# Patient Record
Sex: Female | Born: 1990 | Hispanic: Yes | Marital: Married | Smoking: Never smoker
Health system: Southern US, Community
[De-identification: ages and names within clinical notes are randomized; demographics above are authoritative.]

## PROBLEM LIST (undated history)

## (undated) DIAGNOSIS — E282 Polycystic ovarian syndrome: Secondary | ICD-10-CM

## (undated) DIAGNOSIS — S42362A Displaced segmental fracture of shaft of humerus, left arm, initial encounter for closed fracture: Secondary | ICD-10-CM

## (undated) DIAGNOSIS — F419 Anxiety disorder, unspecified: Secondary | ICD-10-CM

## (undated) DIAGNOSIS — F53 Postpartum depression: Secondary | ICD-10-CM

## (undated) DIAGNOSIS — S42002A Fracture of unspecified part of left clavicle, initial encounter for closed fracture: Secondary | ICD-10-CM

## (undated) DIAGNOSIS — O99345 Other mental disorders complicating the puerperium: Secondary | ICD-10-CM

## (undated) DIAGNOSIS — G5632 Lesion of radial nerve, left upper limb: Secondary | ICD-10-CM

## (undated) DIAGNOSIS — O24419 Gestational diabetes mellitus in pregnancy, unspecified control: Secondary | ICD-10-CM

## (undated) HISTORY — DX: Anxiety disorder, unspecified: F41.9

## (undated) HISTORY — DX: Other mental disorders complicating the puerperium: O99.345

## (undated) HISTORY — PX: FRACTURE SURGERY: SHX138

## (undated) HISTORY — DX: Postpartum depression: F53.0

---

## 2015-07-27 ENCOUNTER — Emergency Department (HOSPITAL_COMMUNITY): Payer: Self-pay

## 2015-07-27 ENCOUNTER — Encounter (HOSPITAL_COMMUNITY): Payer: Self-pay | Admitting: Neurology

## 2015-07-27 ENCOUNTER — Emergency Department (HOSPITAL_COMMUNITY)
Admission: EM | Admit: 2015-07-27 | Discharge: 2015-07-27 | Disposition: A | Discharge status: Home / Self Care | Payer: Self-pay | Attending: Emergency Medicine | Admitting: Emergency Medicine

## 2015-07-27 DIAGNOSIS — R590 Localized enlarged lymph nodes: Secondary | ICD-10-CM | POA: Insufficient documentation

## 2015-07-27 DIAGNOSIS — R102 Pelvic and perineal pain: Secondary | ICD-10-CM

## 2015-07-27 DIAGNOSIS — R509 Fever, unspecified: Secondary | ICD-10-CM

## 2015-07-27 HISTORY — DX: Polycystic ovarian syndrome: E28.2

## 2015-07-27 LAB — BASIC METABOLIC PANEL
Anion gap: 8 (ref 5–15)
BUN: 6 mg/dL (ref 6–20)
CHLORIDE: 103 mmol/L (ref 101–111)
CO2: 25 mmol/L (ref 22–32)
CREATININE: 0.7 mg/dL (ref 0.44–1.00)
Calcium: 9.1 mg/dL (ref 8.9–10.3)
GFR calc Af Amer: >60 mL/min (ref >60)
GFR calc non Af Amer: >60 mL/min (ref >60)
GLUCOSE: 118 mg/dL — AB (ref 65–99)
POTASSIUM: 3.8 mmol/L (ref 3.5–5.1)
SODIUM: 136 mmol/L (ref 135–145)

## 2015-07-27 LAB — URINE MICROSCOPIC-ADD ON: WBC, UA: NONE SEEN WBC/hpf (ref 0–5)

## 2015-07-27 LAB — CBC WITH DIFFERENTIAL/PLATELET
BASOS ABS: 0 10*3/uL (ref 0.0–0.1)
BASOS PCT: 0 %
Eosinophils Absolute: 0.1 10*3/uL (ref 0.0–0.7)
Eosinophils Relative: 1 %
HEMATOCRIT: 40 % (ref 36.0–46.0)
HEMOGLOBIN: 13 g/dL (ref 12.0–15.0)
Lymphocytes Relative: 23 %
Lymphs Abs: 2.3 10*3/uL (ref 0.7–4.0)
MCH: 27.7 pg (ref 26.0–34.0)
MCHC: 32.5 g/dL (ref 30.0–36.0)
MCV: 85.1 fL (ref 78.0–100.0)
MONO ABS: 1 10*3/uL (ref 0.1–1.0)
Monocytes Relative: 10 %
NEUTROS ABS: 6.7 10*3/uL (ref 1.7–7.7)
NEUTROS PCT: 66 %
Platelets: 283 10*3/uL (ref 150–400)
RBC: 4.7 MIL/uL (ref 3.87–5.11)
RDW: 13.4 % (ref 11.5–15.5)
WBC: 10.1 10*3/uL (ref 4.0–10.5)

## 2015-07-27 LAB — WET PREP, GENITAL
Sperm: NONE SEEN
TRICH WET PREP: NONE SEEN
YEAST WET PREP: NONE SEEN

## 2015-07-27 LAB — I-STAT CG4 LACTIC ACID, ED
LACTIC ACID, VENOUS: 0.6 mmol/L (ref 0.5–2.0)
Lactic Acid, Venous: 0.8 mmol/L (ref 0.5–2.0)

## 2015-07-27 LAB — URINALYSIS, ROUTINE W REFLEX MICROSCOPIC
BILIRUBIN URINE: NEGATIVE
Glucose, UA: NEGATIVE mg/dL
Ketones, ur: NEGATIVE mg/dL
LEUKOCYTES UA: NEGATIVE
NITRITE: NEGATIVE
PH: 6 (ref 5.0–8.0)
Protein, ur: NEGATIVE mg/dL
SPECIFIC GRAVITY, URINE: 1.012 (ref 1.005–1.030)

## 2015-07-27 LAB — I-STAT BETA HCG BLOOD, ED (MC, WL, AP ONLY)

## 2015-07-27 LAB — RAPID STREP SCREEN (MED CTR MEBANE ONLY): STREPTOCOCCUS, GROUP A SCREEN (DIRECT): NEGATIVE

## 2015-07-27 LAB — MONONUCLEOSIS SCREEN: MONO SCREEN: NEGATIVE

## 2015-07-27 NOTE — ED Provider Notes (Signed)
CSN: [id number]     Arrival date & time 07/27/15  1122 History   First MD Initiated Contact with Patient 07/27/15 1544     Chief Complaint  Patient presents with  . Fever     (Consider location/radiation/quality/duration/timing/severity/associated sxs/prior Treatment) HPI Patient presents with episodic fever up to 102. Last 3 days. She takes Tylenol for this at home. She's also complains of generalized fatigue and a frontal headache with fever. She denies any sore throat, nasal congestion or sinus pressure. She has no neck pain or stiffness. Denies cough or shortness of breath. No abdominal pain, nausea or vomiting. No new rashes. No sick contacts or recent foreign travel. Patient denies any dysuria, frequency or urgency. Has irregular periods. Denies any recent vaginal discharge or bleeding. Has noticed tender mass in the left groin area. This hs been present for the last 3 days. Past Medical History  Diagnosis Date  . PCOS (polycystic ovarian syndrome)    History reviewed. No pertinent past surgical history. No family history on file. Social History  Substance Use Topics  . Smoking status: Never Smoker   . Smokeless tobacco: None  . Alcohol Use: Yes   OB History    No data available     Review of Systems  Constitutional: Positive for fever and fatigue. Negative for chills.  HENT: Negative for congestion, mouth sores, rhinorrhea, sinus pressure and sore throat.   Eyes: Negative for visual disturbance.  Respiratory: Negative for cough and shortness of breath.   Cardiovascular: Negative for chest pain and leg swelling.  Gastrointestinal: Negative for nausea, vomiting, abdominal pain, diarrhea and constipation.  Genitourinary: Negative for dysuria, frequency, hematuria, flank pain, vaginal bleeding, vaginal discharge and pelvic pain.  Musculoskeletal: Negative for back pain, neck pain and neck stiffness.  Skin: Negative for rash and wound.  Neurological: Positive for headaches.  Negative for dizziness, weakness, light-headedness and numbness.  All other systems reviewed and are negative.     Allergies  Review of patient's allergies indicates no known allergies.  Home Medications   Prior to Admission medications   Not on File   BP 108/70 mmHg  Pulse 93  Temp(Src) 99.4 F (37.4 C) (Oral)  Resp 15  SpO2 100%  LMP 07/01/2015 Physical Exam  Constitutional: She is oriented to person, place, and time. She appears well-developed and well-nourished. No distress.  HENT:  Head: Normocephalic and atraumatic.  Mouth/Throat: Oropharynx is clear and moist. No oropharyngeal exudate.  No sinus tenderness with percussion. Bilateral TMs are normal  Eyes: EOM are normal. Pupils are equal, round, and reactive to light.  Neck: Normal range of motion. Neck supple.  No meningismus  Cardiovascular: Normal rate and regular rhythm.  Exam reveals no gallop and no friction rub.   No murmur heard. Pulmonary/Chest: Effort normal and breath sounds normal. No respiratory distress. She has no wheezes. She has no rales. She exhibits no tenderness.  Abdominal: Soft. Bowel sounds are normal. She exhibits no distension and no mass. There is no tenderness. There is no rebound and no guarding.  Genitourinary:  Patient has tender mass roughly 1-2 cm in diameter in the left inguinal area. There is no overlying erythema or warmth.  Musculoskeletal: Normal range of motion. She exhibits no edema or tenderness.  No lower extremity swelling, asymmetry or tenderness. Distal pulses are equal and intact. No CVA tenderness bilaterally. No midline thoracic or lumbar tenderness.  Lymphadenopathy:    She has no cervical adenopathy.  Neurological: She is alert and oriented to  person, place, and time.  Moves all extremities without deficit. Sensation is fully intact.  Skin: Skin is warm and dry. No rash noted. No erythema.  Psychiatric: She has a normal mood and affect. Her behavior is normal.  Nursing  note and vitals reviewed.   ED Course  Procedures (including critical care time) Labs Review Labs Reviewed  WET PREP, GENITAL - Abnormal; Notable for the following:    Clue Cells Wet Prep HPF POC PRESENT (*)    WBC, Wet Prep HPF POC FEW (*)    All other components within normal limits  BASIC METABOLIC PANEL - Abnormal; Notable for the following:    Glucose, Bld 118 (*)    All other components within normal limits  URINALYSIS, ROUTINE W REFLEX MICROSCOPIC (NOT AT [facility]) - Abnormal; Notable for the following:    Hgb urine dipstick SMALL (*)    All other components within normal limits  URINE MICROSCOPIC-ADD ON - Abnormal; Notable for the following:    Squamous Epithelial / LPF 0-5 (*)    Bacteria, UA RARE (*)    All other components within normal limits  RAPID STREP SCREEN (NOT AT [facility])  CULTURE, GROUP A STREP ([facility])  CBC WITH DIFFERENTIAL/PLATELET  MONONUCLEOSIS SCREEN  I-STAT BETA HCG BLOOD, ED ([facility], [facility], [facility] ONLY)  I-STAT CG4 LACTIC ACID, ED  I-STAT CG4 LACTIC ACID, ED  GC/CHLAMYDIA PROBE AMP ([place]) NOT AT [facility]    Imaging Review US Transvaginal Non-ob  07/27/2015  CLINICAL DATA:  Left adnexal tenderness on the pelvic examination. EXAM: TRANSABDOMINAL AND TRANSVAGINAL ULTRASOUND OF PELVIS TECHNIQUE: Both transabdominal and transvaginal ultrasound examinations of the pelvis were performed. Transabdominal technique was performed for global imaging of the pelvis including uterus, ovaries, adnexal regions, and pelvic cul-de-sac. It was necessary to proceed with endovaginal exam following the transabdominal exam to visualize the endometrium and ovaries. COMPARISON:  None FINDINGS: Uterus Measurements: 6.4 x 3.2 x 3.7 cm. No fibroids or other mass visualized. Normal anteverted position of the uterus. Endometrium Thickness: 0.4 cm.  No focal abnormality visualized. Right ovary Measurements: 2.5 x 1.5 x 2.1 cm. Normal appearance/no adnexal mass. Left ovary Measurements: 3.2 x 2.1 x 2.4  cm. Normal appearance/no adnexal mass. Other findings No abnormal free fluid. IMPRESSION: Normal pelvic ultrasound. Electronically Signed   By: Richarda Overlie M.D.   On: 07/27/2015 19:30   US Pelvis Complete  07/27/2015  CLINICAL DATA:  Left adnexal tenderness on the pelvic examination. EXAM: TRANSABDOMINAL AND TRANSVAGINAL ULTRASOUND OF PELVIS TECHNIQUE: Both transabdominal and transvaginal ultrasound examinations of the pelvis were performed. Transabdominal technique was performed for global imaging of the pelvis including uterus, ovaries, adnexal regions, and pelvic cul-de-sac. It was necessary to proceed with endovaginal exam following the transabdominal exam to visualize the endometrium and ovaries. COMPARISON:  None FINDINGS: Uterus Measurements: 6.4 x 3.2 x 3.7 cm. No fibroids or other mass visualized. Normal anteverted position of the uterus. Endometrium Thickness: 0.4 cm.  No focal abnormality visualized. Right ovary Measurements: 2.5 x 1.5 x 2.1 cm. Normal appearance/no adnexal mass. Left ovary Measurements: 3.2 x 2.1 x 2.4 cm. Normal appearance/no adnexal mass. Other findings No abnormal free fluid. IMPRESSION: Normal pelvic ultrasound. Electronically Signed   By: Richarda Overlie M.D.   On: 07/27/2015 19:30   US Pelvis Limited  07/27/2015  CLINICAL DATA:  Palpable mass within the left groin.  Leukocytosis. EXAM: US PELVIS LIMITED TECHNIQUE: Ultrasound examination of the pelvic soft tissues was performed in the area of clinical concern. COMPARISON:  None. FINDINGS: Focused ultrasound of the area of palpable concern in the left groin demonstrates a hypoechoic solid mass measuring 2.8 by 3.0 by 2.1 cm. Internal blood flow is noted. This mass does not have the typical sonographic morphology of a normal lymph node. The 2 other smaller adjacent crescent-shaped hypoechoic masses are seen. IMPRESSION: 3 cm solid mass within the left groin. Given the distribution of internal blood flow, this mass may represent and  abnormal by sonographic morphology lymph node. Neoplastic soft tissue mass although possible is considered less likely. Electronically Signed   By: Ted Mcalpine M.D.   On: 07/27/2015 19:26   I have personally reviewed and evaluated these images and lab results as part of my medical decision-making.   EKG Interpretation None      MDM   Final diagnoses:  Inguinal lymphadenopathy  Fever, unspecified fever cause    Patient is nontoxic appearing she has normal vital signs as well as normal labs. She does appear to have left inguinal lymphadenopathy. Awaiting pelvic and UA results. Will also test for mono  Patient's workup is essentially normal. Abdominal exam is benign. Pelvic ultrasound without any acute findings. Likely viral illness with lymphadenopathy. Advised to treat symptomatically. Return precautions have been given.  Loren Racer, MD 07/27/15 [phone]

## 2015-07-27 NOTE — ED Notes (Signed)
[facility] to left [facility] with fever intermittently x 3 days. Denies n/v/d. Pt is a x 4. Took tylenol this morning for fever. Sent here for further evaluation of groin mass.

## 2015-07-27 NOTE — Discharge Instructions (Signed)
Lymphadenopathy Lymphadenopathy refers to swollen or enlarged lymph glands, also called lymph nodes. Lymph glands are part of your body's defense (immune) system, which protects the body from infections, germs, and diseases. Lymph glands are found in many locations in your body, including the neck, underarm, and groin.  Many things can cause lymph glands to become enlarged. When your immune system responds to germs, such as viruses or bacteria, infection-fighting cells and fluid build up. This causes the glands to grow in size. Usually, this is not something to worry about. The swelling and any soreness often go away without treatment. However, swollen lymph glands can also be caused by a number of diseases. Your health care provider may do various tests to help determine the cause. If the cause of your swollen lymph glands cannot be found, it is important to monitor your condition to make sure the swelling goes away. HOME CARE INSTRUCTIONS Watch your condition for any changes. The following actions may help to lessen any discomfort you are feeling:  Get plenty of rest.  Take medicines only as directed by your health care provider. Your health care provider may recommend over-the-counter medicines for pain.  Apply moist heat compresses to the site of swollen lymph nodes as directed by your health care provider. This can help reduce any pain.  Check your lymph nodes daily for any changes.  Keep all follow-up visits as directed by your health care provider. This is important. SEEK MEDICAL CARE IF:  Your lymph nodes are still swollen after 2 weeks.  Your swelling increases or spreads to other areas.  Your lymph nodes are hard, seem fixed to the skin, or are growing rapidly.  Your skin over the lymph nodes is red and inflamed.  You have a fever.  You have chills.  You have fatigue.  You develop a sore throat.  You have abdominal pain.  You have weight loss.  You have night  sweats. SEEK IMMEDIATE MEDICAL CARE IF:  You notice fluid leaking from the area of the enlarged lymph node.  You have severe pain in any area of your body.  You have chest pain.  You have shortness of breath.   This information is not intended to replace advice given to you by your health care provider. Make sure you discuss any questions you have with your health care provider.   Document Released: 11/07/2007 Document Revised: 02/18/2014 Document Reviewed: 09/02/2013 Elsevier Interactive Patient Education 2016 Elsevier Inc.    Fever, Adult A fever is an increase in the body's temperature. It is usually defined as a temperature of 100F (38C) or higher. Brief mild or moderate fevers generally have no long-term effects, and they often do not require treatment. Moderate or high fevers may make you feel uncomfortable and can sometimes be a sign of a serious illness or disease. The sweating that may occur with repeated or prolonged fever may also cause dehydration. Fever is confirmed by taking a temperature with a thermometer. A measured temperature can vary with:  Age.  Time of day.  Location of the thermometer:  Mouth (oral).  Rectum (rectal).  Ear (tympanic).  Underarm (axillary).  Forehead (temporal). HOME CARE INSTRUCTIONS Pay attention to any changes in your symptoms. Take these actions to help with your condition:  Take over-the counter and prescription medicines only as told by your health care provider. Follow the dosing instructions carefully.  If you were prescribed an antibiotic medicine, take it as told by your health care provider. Do not  stop taking the antibiotic even if you start to feel better.  Rest as needed.  Drink enough fluid to keep your urine clear or pale yellow. This helps to prevent dehydration.  Sponge yourself or bathe with room-temperature water to help reduce your body temperature as needed. Do not use ice water.  Do not overbundle  yourself in blankets or heavy clothes. SEEK MEDICAL CARE IF:  You vomit.  You cannot eat or drink without vomiting.  You have diarrhea.  You have pain when you urinate.  Your symptoms do not improve with treatment.  You develop new symptoms.  You develop excessive weakness. SEEK IMMEDIATE MEDICAL CARE IF:  You have shortness of breath or have trouble breathing.  You are dizzy or you faint.  You are disoriented or confused.  You develop signs of dehydration, such as a dry mouth, decreased urination, or paleness.  You develop severe pain in your abdomen.  You have persistent vomiting or diarrhea.  You develop a skin rash.  Your symptoms suddenly get worse.   This information is not intended to replace advice given to you by your health care provider. Make sure you discuss any questions you have with your health care provider.   Document Released: 07/24/2000 Document Revised: 10/19/2014 Document Reviewed: 03/24/2014 Elsevier Interactive Patient Education [employer]2016 Elsevier Inc.

## 2015-07-28 LAB — GC/CHLAMYDIA PROBE AMP (~~LOC~~) NOT AT ARMC
Chlamydia: NEGATIVE
Neisseria Gonorrhea: NEGATIVE

## 2015-07-30 LAB — CULTURE, GROUP A STREP (THRC)

## 2015-10-09 ENCOUNTER — Emergency Department (HOSPITAL_COMMUNITY): Payer: No Typology Code available for payment source

## 2015-10-09 ENCOUNTER — Encounter (HOSPITAL_COMMUNITY): Payer: Self-pay | Admitting: Emergency Medicine

## 2015-10-09 ENCOUNTER — Inpatient Hospital Stay (HOSPITAL_COMMUNITY)
Admission: EM | Admit: 2015-10-09 | Discharge: 2015-10-12 | DRG: 494 | Disposition: A | Discharge status: Home / Self Care | Payer: No Typology Code available for payment source | Attending: Orthopedic Surgery | Admitting: Orthopedic Surgery

## 2015-10-09 DIAGNOSIS — S51022A Laceration with foreign body of left elbow, initial encounter: Secondary | ICD-10-CM | POA: Diagnosis present

## 2015-10-09 DIAGNOSIS — Z23 Encounter for immunization: Secondary | ICD-10-CM | POA: Diagnosis not present

## 2015-10-09 DIAGNOSIS — M542 Cervicalgia: Secondary | ICD-10-CM

## 2015-10-09 DIAGNOSIS — T148XXA Other injury of unspecified body region, initial encounter: Secondary | ICD-10-CM

## 2015-10-09 DIAGNOSIS — S42309A Unspecified fracture of shaft of humerus, unspecified arm, initial encounter for closed fracture: Secondary | ICD-10-CM

## 2015-10-09 DIAGNOSIS — S42352A Displaced comminuted fracture of shaft of humerus, left arm, initial encounter for closed fracture: Secondary | ICD-10-CM

## 2015-10-09 DIAGNOSIS — S42009A Fracture of unspecified part of unspecified clavicle, initial encounter for closed fracture: Secondary | ICD-10-CM

## 2015-10-09 DIAGNOSIS — E282 Polycystic ovarian syndrome: Secondary | ICD-10-CM | POA: Diagnosis present

## 2015-10-09 DIAGNOSIS — S42022A Displaced fracture of shaft of left clavicle, initial encounter for closed fracture: Secondary | ICD-10-CM | POA: Diagnosis present

## 2015-10-09 DIAGNOSIS — Z189 Retained foreign body fragments, unspecified material: Secondary | ICD-10-CM

## 2015-10-09 DIAGNOSIS — S42362A Displaced segmental fracture of shaft of humerus, left arm, initial encounter for closed fracture: Secondary | ICD-10-CM | POA: Diagnosis present

## 2015-10-09 DIAGNOSIS — S42002A Fracture of unspecified part of left clavicle, initial encounter for closed fracture: Secondary | ICD-10-CM | POA: Diagnosis present

## 2015-10-09 DIAGNOSIS — G5632 Lesion of radial nerve, left upper limb: Secondary | ICD-10-CM | POA: Diagnosis present

## 2015-10-09 DIAGNOSIS — E559 Vitamin D deficiency, unspecified: Secondary | ICD-10-CM | POA: Diagnosis present

## 2015-10-09 DIAGNOSIS — Y92481 Parking lot as the place of occurrence of the external cause: Secondary | ICD-10-CM | POA: Diagnosis not present

## 2015-10-09 DIAGNOSIS — S51012A Laceration without foreign body of left elbow, initial encounter: Secondary | ICD-10-CM

## 2015-10-09 DIAGNOSIS — M79602 Pain in left arm: Secondary | ICD-10-CM

## 2015-10-09 DIAGNOSIS — S42422A Displaced comminuted supracondylar fracture without intercondylar fracture of left humerus, initial encounter for closed fracture: Principal | ICD-10-CM | POA: Diagnosis present

## 2015-10-09 DIAGNOSIS — S42302B Unspecified fracture of shaft of humerus, left arm, initial encounter for open fracture: Secondary | ICD-10-CM

## 2015-10-09 HISTORY — DX: Fracture of unspecified part of left clavicle, initial encounter for closed fracture: S42.002A

## 2015-10-09 HISTORY — DX: Displaced segmental fracture of shaft of humerus, left arm, initial encounter for closed fracture: S42.362A

## 2015-10-09 HISTORY — DX: Lesion of radial nerve, left upper limb: G56.32

## 2015-10-09 LAB — COMPREHENSIVE METABOLIC PANEL
ALT: 33 U/L (ref 14–54)
ANION GAP: 7 (ref 5–15)
AST: 38 U/L (ref 15–41)
Albumin: 4.1 g/dL (ref 3.5–5.0)
Alkaline Phosphatase: 57 U/L (ref 38–126)
BILIRUBIN TOTAL: 0.6 mg/dL (ref 0.3–1.2)
BUN: 9 mg/dL (ref 6–20)
CALCIUM: 9.3 mg/dL (ref 8.9–10.3)
CO2: 25 mmol/L (ref 22–32)
Chloride: 104 mmol/L (ref 101–111)
Creatinine, Ser: 0.67 mg/dL (ref 0.44–1.00)
GFR calc Af Amer: >60 mL/min (ref >60)
Glucose, Bld: 125 mg/dL — ABNORMAL HIGH (ref 65–99)
POTASSIUM: 3.7 mmol/L (ref 3.5–5.1)
Sodium: 136 mmol/L (ref 135–145)
TOTAL PROTEIN: 7.2 g/dL (ref 6.5–8.1)

## 2015-10-09 LAB — I-STAT CHEM 8, ED
BUN: 11 mg/dL (ref 6–20)
CALCIUM ION: 1.18 mmol/L (ref 1.13–1.30)
Chloride: 102 mmol/L (ref 101–111)
Creatinine, Ser: 0.6 mg/dL (ref 0.44–1.00)
GLUCOSE: 122 mg/dL — AB (ref 65–99)
HCT: 44 % (ref 36.0–46.0)
HEMOGLOBIN: 15 g/dL (ref 12.0–15.0)
POTASSIUM: 3.8 mmol/L (ref 3.5–5.1)
Sodium: 140 mmol/L (ref 135–145)
TCO2: 25 mmol/L (ref 0–100)

## 2015-10-09 LAB — I-STAT CG4 LACTIC ACID, ED: LACTIC ACID, VENOUS: 1.68 mmol/L (ref 0.5–1.9)

## 2015-10-09 LAB — I-STAT BETA HCG BLOOD, ED (MC, WL, AP ONLY): I-stat hCG, quantitative: <5 m[IU]/mL (ref <5)

## 2015-10-09 LAB — SAMPLE TO BLOOD BANK

## 2015-10-09 LAB — CBC
HEMATOCRIT: 43 % (ref 36.0–46.0)
HEMOGLOBIN: 13.9 g/dL (ref 12.0–15.0)
MCH: 28.1 pg (ref 26.0–34.0)
MCHC: 32.3 g/dL (ref 30.0–36.0)
MCV: 86.9 fL (ref 78.0–100.0)
Platelets: 319 10*3/uL (ref 150–400)
RBC: 4.95 MIL/uL (ref 3.87–5.11)
RDW: 13.3 % (ref 11.5–15.5)
WBC: 8.9 10*3/uL (ref 4.0–10.5)

## 2015-10-09 LAB — CDS SEROLOGY

## 2015-10-09 LAB — PROTIME-INR
INR: 0.98
PROTHROMBIN TIME: 13 s (ref 11.4–15.2)

## 2015-10-09 LAB — ETHANOL

## 2015-10-09 MED ORDER — FENTANYL CITRATE (PF) 100 MCG/2ML IJ SOLN
50.0000 ug | Freq: Once | INTRAMUSCULAR | Status: AC
  Administered 2015-10-09: 50 ug via INTRAVENOUS
  Filled 2015-10-09: qty 2

## 2015-10-09 MED ORDER — FENTANYL CITRATE (PF) 100 MCG/2ML IJ SOLN
100.0000 ug | Freq: Once | INTRAMUSCULAR | Status: DC
Start: 2015-10-09 — End: 2015-10-09
  Filled 2015-10-09: qty 2

## 2015-10-09 MED ORDER — HYDROMORPHONE HCL 1 MG/ML IJ SOLN
1.0000 mg | INTRAMUSCULAR | Status: DC | PRN
  Administered 2015-10-10: 1 mg via INTRAVENOUS
  Filled 2015-10-09: qty 1

## 2015-10-09 MED ORDER — HYDROMORPHONE HCL 1 MG/ML IJ SOLN
1.0000 mg | Freq: Once | INTRAMUSCULAR | Status: AC
  Administered 2015-10-09: 1 mg via INTRAVENOUS
  Filled 2015-10-09: qty 1

## 2015-10-09 MED ORDER — MIDAZOLAM HCL 2 MG/2ML IJ SOLN
INTRAMUSCULAR | Status: AC
  Administered 2015-10-09: 2 mg
  Filled 2015-10-09: qty 4

## 2015-10-09 MED ORDER — DIPHENHYDRAMINE HCL 12.5 MG/5ML PO ELIX: 12.5000 mg | ORAL_SOLUTION | ORAL | Status: DC | PRN

## 2015-10-09 MED ORDER — OXYCODONE HCL 5 MG PO TABS: 5.0000 mg | ORAL_TABLET | ORAL | Status: DC | PRN

## 2015-10-09 MED ORDER — ONDANSETRON HCL 4 MG/2ML IJ SOLN: 4.0000 mg | Freq: Four times a day (QID) | INTRAMUSCULAR | Status: DC | PRN

## 2015-10-09 MED ORDER — TETANUS-DIPHTH-ACELL PERTUSSIS 5-2.5-18.5 LF-MCG/0.5 IM SUSP
0.5000 mL | Freq: Once | INTRAMUSCULAR | Status: AC
  Administered 2015-10-09: 0.5 mL via INTRAMUSCULAR
  Filled 2015-10-09: qty 0.5

## 2015-10-09 MED ORDER — ACETAMINOPHEN 325 MG PO TABS: 650.0000 mg | ORAL_TABLET | Freq: Four times a day (QID) | ORAL | Status: DC | PRN

## 2015-10-09 MED ORDER — CEFAZOLIN SODIUM-DEXTROSE 2-4 GM/100ML-% IV SOLN
2.0000 g | INTRAVENOUS | Status: DC
  Filled 2015-10-09: qty 100

## 2015-10-09 MED ORDER — ACETAMINOPHEN 650 MG RE SUPP: 650.0000 mg | Freq: Four times a day (QID) | RECTAL | Status: DC | PRN

## 2015-10-09 MED ORDER — MORPHINE SULFATE (PF) 4 MG/ML IV SOLN
4.0000 mg | Freq: Once | INTRAVENOUS | Status: AC
  Administered 2015-10-09: 4 mg via INTRAVENOUS
  Filled 2015-10-09: qty 1

## 2015-10-09 MED ORDER — METHOCARBAMOL 1000 MG/10ML IJ SOLN
500.0000 mg | Freq: Four times a day (QID) | INTRAVENOUS | Status: DC | PRN
  Filled 2015-10-09: qty 5

## 2015-10-09 MED ORDER — POVIDONE-IODINE 10 % EX SWAB: 2.0000 "application " | Freq: Once | CUTANEOUS | Status: DC

## 2015-10-09 MED ORDER — IOPAMIDOL (ISOVUE-300) INJECTION 61%
INTRAVENOUS | Status: AC
  Filled 2015-10-09: qty 75

## 2015-10-09 MED ORDER — SODIUM CHLORIDE 0.9 % IV SOLN: INTRAVENOUS | Status: DC

## 2015-10-09 MED ORDER — CHLORHEXIDINE GLUCONATE 4 % EX LIQD
60.0000 mL | Freq: Once | CUTANEOUS | Status: AC
  Administered 2015-10-10: 4 via TOPICAL
  Filled 2015-10-09: qty 60

## 2015-10-09 MED ORDER — HYDROMORPHONE HCL 1 MG/ML IJ SOLN: 0.5000 mg | INTRAMUSCULAR | Status: DC | PRN

## 2015-10-09 MED ORDER — PROMETHAZINE HCL 25 MG/ML IJ SOLN: 25.0000 mg | Freq: Four times a day (QID) | INTRAMUSCULAR | Status: DC | PRN

## 2015-10-09 MED ORDER — METHOCARBAMOL 500 MG PO TABS
500.0000 mg | ORAL_TABLET | Freq: Four times a day (QID) | ORAL | Status: DC | PRN
  Administered 2015-10-09 – 2015-10-10 (×2): 500 mg via ORAL
  Filled 2015-10-09 (×2): qty 1

## 2015-10-09 MED ORDER — HYDROMORPHONE HCL 1 MG/ML IJ SOLN
0.5000 mg | Freq: Once | INTRAMUSCULAR | Status: AC
Start: 2015-10-09 — End: 2015-10-09
  Administered 2015-10-09: 0.5 mg via INTRAVENOUS
  Filled 2015-10-09: qty 1
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
## 2015-10-09 NOTE — Progress Notes (Signed)
Pt roomed to unit at 1855. C/o nausea but no prn nausea meds ordered. MD paged

## 2015-10-09 NOTE — ED Notes (Signed)
Doctor at bedside.
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
## 2015-10-09 NOTE — Progress Notes (Signed)
Orthopedic Tech Progress Note Patient Details:  Krystal Glass 1990/07/11 [id number]  Ortho Devices Type of Ortho Device: Ace wrap, Arm sling, Post (long arm) splint, Coapt Ortho Device/Splint Location: Applied well padded posterior long arm splint, coaptation splint, and arm sling to Lt arm. Ortho Device/Splint Interventions: Ordered, Application Assisted Dr. Devona KonigBlackman  Krystal Glass 10/09/2015, 4:33 PM

## 2015-10-09 NOTE — ED Notes (Signed)
Attempted report to [facility]. They state they aren't receiving the pt and bed order has been changed.

## 2015-10-10 ENCOUNTER — Encounter (HOSPITAL_COMMUNITY)
Admission: EM | Disposition: A | Discharge status: Home / Self Care | Payer: Self-pay | Source: Home / Self Care | Attending: Orthopedic Surgery

## 2015-10-10 ENCOUNTER — Inpatient Hospital Stay (HOSPITAL_COMMUNITY): Payer: No Typology Code available for payment source | Admitting: Anesthesiology

## 2015-10-10 ENCOUNTER — Inpatient Hospital Stay (HOSPITAL_COMMUNITY): Payer: No Typology Code available for payment source

## 2015-10-10 HISTORY — PX: ORIF HUMERUS FRACTURE: SHX2126

## 2015-10-10 LAB — CBC
HCT: 38.1 % (ref 36.0–46.0)
Hemoglobin: 12 g/dL (ref 12.0–15.0)
MCH: 28 pg (ref 26.0–34.0)
MCHC: 31.5 g/dL (ref 30.0–36.0)
MCV: 88.8 fL (ref 78.0–100.0)
PLATELETS: 261 10*3/uL (ref 150–400)
RBC: 4.29 MIL/uL (ref 3.87–5.11)
RDW: 13.7 % (ref 11.5–15.5)
WBC: 10.3 10*3/uL (ref 4.0–10.5)

## 2015-10-10 LAB — URINALYSIS, ROUTINE W REFLEX MICROSCOPIC
BILIRUBIN URINE: NEGATIVE
Glucose, UA: NEGATIVE mg/dL
Hgb urine dipstick: NEGATIVE
Ketones, ur: NEGATIVE mg/dL
LEUKOCYTES UA: NEGATIVE
NITRITE: NEGATIVE
PH: 7 (ref 5.0–8.0)
Protein, ur: NEGATIVE mg/dL
SPECIFIC GRAVITY, URINE: 1.019 (ref 1.005–1.030)

## 2015-10-10 LAB — CREATININE, SERUM
CREATININE: 0.64 mg/dL (ref 0.44–1.00)
GFR calc non Af Amer: >60 mL/min (ref >60)

## 2015-10-10 LAB — SURGICAL PCR SCREEN
MRSA, PCR: NEGATIVE
Staphylococcus aureus: NEGATIVE

## 2015-10-10 SURGERY — OPEN REDUCTION INTERNAL FIXATION (ORIF) DISTAL HUMERUS FRACTURE
Anesthesia: General | Site: Arm Upper | Laterality: Left

## 2015-10-10 MED ORDER — ACETAMINOPHEN 325 MG PO TABS: 325.0000 mg | ORAL_TABLET | ORAL | Status: DC | PRN

## 2015-10-10 MED ORDER — ACETAMINOPHEN 10 MG/ML IV SOLN
INTRAVENOUS | Status: AC
  Filled 2015-10-10: qty 100

## 2015-10-10 MED ORDER — BISACODYL 5 MG PO TBEC: 5.0000 mg | DELAYED_RELEASE_TABLET | Freq: Every day | ORAL | Status: DC | PRN

## 2015-10-10 MED ORDER — OXYCODONE HCL 5 MG PO TABS: 5.0000 mg | ORAL_TABLET | ORAL | Status: DC | PRN

## 2015-10-10 MED ORDER — PROPOFOL 10 MG/ML IV BOLUS
INTRAVENOUS | Status: DC | PRN
  Administered 2015-10-10: 20 mg via INTRAVENOUS
  Administered 2015-10-10: 200 mg via INTRAVENOUS

## 2015-10-10 MED ORDER — NALOXONE HCL 0.4 MG/ML IJ SOLN: 0.4000 mg | INTRAMUSCULAR | Status: DC | PRN

## 2015-10-10 MED ORDER — ACETAMINOPHEN 10 MG/ML IV SOLN
1000.0000 mg | Freq: Four times a day (QID) | INTRAVENOUS | Status: DC
  Administered 2015-10-10 (×2): 1000 mg via INTRAVENOUS
  Filled 2015-10-10: qty 100

## 2015-10-10 MED ORDER — SUGAMMADEX SODIUM 200 MG/2ML IV SOLN
INTRAVENOUS | Status: AC
  Filled 2015-10-10: qty 2

## 2015-10-10 MED ORDER — HYDROMORPHONE HCL 1 MG/ML IJ SOLN: 0.2500 mg | INTRAMUSCULAR | Status: DC | PRN

## 2015-10-10 MED ORDER — DOCUSATE SODIUM 100 MG PO CAPS
100.0000 mg | ORAL_CAPSULE | Freq: Two times a day (BID) | ORAL | Status: DC
  Administered 2015-10-10 – 2015-10-12 (×4): 100 mg via ORAL
  Filled 2015-10-10 (×5): qty 1

## 2015-10-10 MED ORDER — ONDANSETRON HCL 4 MG/2ML IJ SOLN
4.0000 mg | Freq: Four times a day (QID) | INTRAMUSCULAR | Status: DC | PRN
  Administered 2015-10-10: 4 mg via INTRAVENOUS

## 2015-10-10 MED ORDER — ACETAMINOPHEN 160 MG/5ML PO SOLN: 325.0000 mg | ORAL | Status: DC | PRN

## 2015-10-10 MED ORDER — OXYCODONE HCL 5 MG/5ML PO SOLN: 5.0000 mg | Freq: Once | ORAL | Status: DC | PRN

## 2015-10-10 MED ORDER — MIDAZOLAM HCL 2 MG/2ML IJ SOLN
INTRAMUSCULAR | Status: AC
  Filled 2015-10-10: qty 2

## 2015-10-10 MED ORDER — FENTANYL CITRATE (PF) 100 MCG/2ML IJ SOLN
INTRAMUSCULAR | Status: AC
  Filled 2015-10-10: qty 2

## 2015-10-10 MED ORDER — CEFAZOLIN IN D5W 1 GM/50ML IV SOLN
1.0000 g | Freq: Four times a day (QID) | INTRAVENOUS | Status: AC
  Administered 2015-10-10 – 2015-10-11 (×3): 1 g via INTRAVENOUS
  Filled 2015-10-10 (×3): qty 50

## 2015-10-10 MED ORDER — ONDANSETRON HCL 4 MG PO TABS: 4.0000 mg | ORAL_TABLET | Freq: Four times a day (QID) | ORAL | Status: DC | PRN

## 2015-10-10 MED ORDER — ONDANSETRON HCL 4 MG/2ML IJ SOLN
INTRAMUSCULAR | Status: DC | PRN
Start: 2015-10-10 — End: 2015-10-10
  Administered 2015-10-10: 4 mg via INTRAVENOUS

## 2015-10-10 MED ORDER — LACTATED RINGERS IV SOLN
INTRAVENOUS | Status: DC | PRN
  Administered 2015-10-10 (×3): via INTRAVENOUS

## 2015-10-10 MED ORDER — ENOXAPARIN SODIUM 40 MG/0.4ML ~~LOC~~ SOLN
40.0000 mg | SUBCUTANEOUS | Status: DC
  Administered 2015-10-11 – 2015-10-12 (×2): 40 mg via SUBCUTANEOUS
  Filled 2015-10-10 (×2): qty 0.4

## 2015-10-10 MED ORDER — MIDAZOLAM HCL 5 MG/5ML IJ SOLN
INTRAMUSCULAR | Status: DC | PRN
  Administered 2015-10-10: 2 mg via INTRAVENOUS

## 2015-10-10 MED ORDER — PROPOFOL 10 MG/ML IV BOLUS
INTRAVENOUS | Status: AC
  Filled 2015-10-10: qty 20

## 2015-10-10 MED ORDER — CEFAZOLIN SODIUM-DEXTROSE 2-4 GM/100ML-% IV SOLN
2.0000 g | Freq: Once | INTRAVENOUS | Status: AC
  Administered 2015-10-10 (×2): 2 g via INTRAVENOUS
  Filled 2015-10-10: qty 100

## 2015-10-10 MED ORDER — DIPHENHYDRAMINE HCL 12.5 MG/5ML PO ELIX: 12.5000 mg | ORAL_SOLUTION | ORAL | Status: DC | PRN

## 2015-10-10 MED ORDER — METOCLOPRAMIDE HCL 5 MG/ML IJ SOLN
5.0000 mg | Freq: Three times a day (TID) | INTRAMUSCULAR | Status: DC | PRN
  Administered 2015-10-10: 10 mg via INTRAVENOUS
  Filled 2015-10-10: qty 2

## 2015-10-10 MED ORDER — 0.9 % SODIUM CHLORIDE (POUR BTL) OPTIME
TOPICAL | Status: DC | PRN
  Administered 2015-10-10: 1000 mL

## 2015-10-10 MED ORDER — OXYCODONE HCL 5 MG PO TABS: 5.0000 mg | ORAL_TABLET | Freq: Once | ORAL | Status: DC | PRN

## 2015-10-10 MED ORDER — PHENYLEPHRINE HCL 10 MG/ML IJ SOLN
INTRAMUSCULAR | Status: DC | PRN
Start: 2015-10-10 — End: 2015-10-10
  Administered 2015-10-10: 80 ug via INTRAVENOUS
  Administered 2015-10-10: 40 ug via INTRAVENOUS
  Administered 2015-10-10 (×2): 80 ug via INTRAVENOUS

## 2015-10-10 MED ORDER — POLYETHYLENE GLYCOL 3350 17 G PO PACK
17.0000 g | PACK | Freq: Every day | ORAL | Status: DC
  Administered 2015-10-10 – 2015-10-12 (×3): 17 g via ORAL
  Filled 2015-10-10 (×3): qty 1

## 2015-10-10 MED ORDER — HYDROMORPHONE 1 MG/ML IV SOLN
INTRAVENOUS | Status: DC
  Administered 2015-10-10: 22:00:00 via INTRAVENOUS
  Administered 2015-10-11: 0.3 mg via INTRAVENOUS
  Administered 2015-10-11: 1.2 mg via INTRAVENOUS
  Administered 2015-10-11: 0.3 mg via INTRAVENOUS
  Filled 2015-10-10: qty 25

## 2015-10-10 MED ORDER — CEFAZOLIN SODIUM 1 G IJ SOLR
INTRAMUSCULAR | Status: AC
  Filled 2015-10-10: qty 20

## 2015-10-10 MED ORDER — ACETAMINOPHEN 650 MG RE SUPP: 650.0000 mg | Freq: Four times a day (QID) | RECTAL | Status: DC | PRN

## 2015-10-10 MED ORDER — FENTANYL CITRATE (PF) 100 MCG/2ML IJ SOLN
INTRAMUSCULAR | Status: DC | PRN
  Administered 2015-10-10: 100 ug via INTRAVENOUS
  Administered 2015-10-10 (×2): 50 ug via INTRAVENOUS

## 2015-10-10 MED ORDER — LIDOCAINE HCL 4 % EX SOLN
CUTANEOUS | Status: DC | PRN
  Administered 2015-10-10: 2 mL via TOPICAL

## 2015-10-10 MED ORDER — POTASSIUM CHLORIDE IN NACL 20-0.9 MEQ/L-% IV SOLN
INTRAVENOUS | Status: DC
  Administered 2015-10-10: 21:00:00 via INTRAVENOUS
  Filled 2015-10-10: qty 1000

## 2015-10-10 MED ORDER — METOCLOPRAMIDE HCL 5 MG PO TABS: 5.0000 mg | ORAL_TABLET | Freq: Three times a day (TID) | ORAL | Status: DC | PRN

## 2015-10-10 MED ORDER — ACETAMINOPHEN 325 MG PO TABS: 650.0000 mg | ORAL_TABLET | Freq: Four times a day (QID) | ORAL | Status: DC | PRN

## 2015-10-10 MED ORDER — GABAPENTIN 300 MG PO CAPS
300.0000 mg | ORAL_CAPSULE | Freq: Two times a day (BID) | ORAL | Status: DC
  Administered 2015-10-10 – 2015-10-12 (×4): 300 mg via ORAL
  Filled 2015-10-10 (×4): qty 1

## 2015-10-10 MED ORDER — BUPIVACAINE-EPINEPHRINE (PF) 0.5% -1:200000 IJ SOLN
INTRAMUSCULAR | Status: DC | PRN
  Administered 2015-10-10: 30 mL via PERINEURAL

## 2015-10-10 MED ORDER — SODIUM CHLORIDE 0.9% FLUSH: 9.0000 mL | INTRAVENOUS | Status: DC | PRN

## 2015-10-10 MED ORDER — CEFAZOLIN SODIUM-DEXTROSE 2-4 GM/100ML-% IV SOLN: 2.0000 g | Freq: Once | INTRAVENOUS | Status: DC

## 2015-10-10 MED ORDER — DIPHENHYDRAMINE HCL 12.5 MG/5ML PO ELIX: 12.5000 mg | ORAL_SOLUTION | Freq: Four times a day (QID) | ORAL | Status: DC | PRN

## 2015-10-10 MED ORDER — ONDANSETRON HCL 4 MG/2ML IJ SOLN
4.0000 mg | Freq: Four times a day (QID) | INTRAMUSCULAR | Status: DC | PRN
  Filled 2015-10-10: qty 2

## 2015-10-10 MED ORDER — SUGAMMADEX SODIUM 200 MG/2ML IV SOLN
INTRAVENOUS | Status: DC | PRN
  Administered 2015-10-10: 200 mg via INTRAVENOUS

## 2015-10-10 MED ORDER — DEXTROSE 5 % IV SOLN
INTRAVENOUS | Status: DC | PRN
  Administered 2015-10-10: 10 ug/min via INTRAVENOUS

## 2015-10-10 MED ORDER — DIPHENHYDRAMINE HCL 50 MG/ML IJ SOLN: 12.5000 mg | Freq: Four times a day (QID) | INTRAMUSCULAR | Status: DC | PRN

## 2015-10-10 MED ORDER — ROCURONIUM BROMIDE 100 MG/10ML IV SOLN
INTRAVENOUS | Status: DC | PRN
  Administered 2015-10-10: 10 mg via INTRAVENOUS
  Administered 2015-10-10: 50 mg via INTRAVENOUS

## 2015-10-10 MED ORDER — SUCCINYLCHOLINE CHLORIDE 20 MG/ML IJ SOLN
INTRAMUSCULAR | Status: DC | PRN
  Administered 2015-10-10: 60 mg via INTRAVENOUS

## 2015-10-10 MED ORDER — HYDROCODONE-ACETAMINOPHEN 7.5-325 MG PO TABS: 1.0000 | ORAL_TABLET | Freq: Four times a day (QID) | ORAL | Status: DC | PRN

## 2015-10-10 SURGICAL SUPPLY — 104 items
BENZOIN TINCTURE PRP APPL 2/3 (GAUZE/BANDAGES/DRESSINGS) ×3 IMPLANT
BIT DRILL 2.0 LNG QUCK RELEASE (BIT) ×1 IMPLANT
BIT DRILL 2.5X110 QC LCP DISP (BIT) ×3 IMPLANT
BIT DRILL 2.8 (BIT) ×1
BIT DRILL 2.8X5 QR DISP (BIT) ×3 IMPLANT
BIT DRILL CALIBRATED 1.8MM (BIT) ×1 IMPLANT
BIT DRILL CANN QC 2.8X165 (BIT) ×1 IMPLANT
BIT DRILL QC 2.4X100 (BIT) ×3 IMPLANT
BLADE AVERAGE 25MMX9MM (BLADE) ×1
BLADE AVERAGE 25X9 (BLADE) ×2 IMPLANT
BNDG ESMARK 4X9 LF (GAUZE/BANDAGES/DRESSINGS) ×3 IMPLANT
BNDG GAUZE ELAST 4 BULKY (GAUZE/BANDAGES/DRESSINGS) ×6 IMPLANT
BRUSH SCRUB DISP (MISCELLANEOUS) ×6 IMPLANT
CORDS BIPOLAR (ELECTRODE) ×3 IMPLANT
COVER SURGICAL LIGHT HANDLE (MISCELLANEOUS) ×6 IMPLANT
DRAIN PENROSE 1/4X12 LTX STRL (WOUND CARE) ×3 IMPLANT
DRAPE C-ARM 42X72 X-RAY (DRAPES) ×3 IMPLANT
DRAPE C-ARMOR (DRAPES) ×3 IMPLANT
DRAPE EXTREMITY T 121X128X90 (DRAPE) ×3 IMPLANT
DRAPE IMP U-DRAPE 54X76 (DRAPES) ×3 IMPLANT
DRAPE INCISE IOBAN 66X45 STRL (DRAPES) ×3 IMPLANT
DRAPE SURG 17X11 SM STRL (DRAPES) ×6 IMPLANT
DRAPE U-SHAPE 47X51 STRL (DRAPES) ×6 IMPLANT
DRILL 2.0 LNG QUICK RELEASE (BIT) ×3
DRILL BIT 2.8MM (BIT) ×2
DRILL CALIBRATED 1.8MM (BIT) ×3
DRSG ADAPTIC 3X8 NADH LF (GAUZE/BANDAGES/DRESSINGS) ×3 IMPLANT
DRSG MEPILEX BORDER 4X4 (GAUZE/BANDAGES/DRESSINGS) ×3 IMPLANT
DRSG MEPILEX BORDER 4X8 (GAUZE/BANDAGES/DRESSINGS) ×3 IMPLANT
DRSG PAD ABDOMINAL 8X10 ST (GAUZE/BANDAGES/DRESSINGS) ×3 IMPLANT
ELECT BLADE 4.0 EZ CLEAN MEGAD (MISCELLANEOUS) ×3
ELECT REM PT RETURN 9FT ADLT (ELECTROSURGICAL) ×3
ELECTRODE BLDE 4.0 EZ CLN MEGD (MISCELLANEOUS) ×1 IMPLANT
ELECTRODE REM PT RTRN 9FT ADLT (ELECTROSURGICAL) ×1 IMPLANT
EVACUATOR 1/8 PVC DRAIN (DRAIN) ×3 IMPLANT
GAUZE SPONGE 4X4 12PLY STRL (GAUZE/BANDAGES/DRESSINGS) ×6 IMPLANT
GLOVE BIO SURGEON STRL SZ7.5 (GLOVE) ×3 IMPLANT
GLOVE BIOGEL PI IND STRL 7.5 (GLOVE) ×1 IMPLANT
GLOVE BIOGEL PI IND STRL 8 (GLOVE) ×1 IMPLANT
GLOVE BIOGEL PI INDICATOR 7.5 (GLOVE) ×2
GLOVE BIOGEL PI INDICATOR 8 (GLOVE) ×2
GLOVE SURG SS PI 6.5 STRL IVOR (GLOVE) ×3 IMPLANT
GOWN STRL REUS W/ TWL LRG LVL3 (GOWN DISPOSABLE) ×2 IMPLANT
GOWN STRL REUS W/ TWL XL LVL3 (GOWN DISPOSABLE) ×1 IMPLANT
GOWN STRL REUS W/TWL LRG LVL3 (GOWN DISPOSABLE) ×4
GOWN STRL REUS W/TWL XL LVL3 (GOWN DISPOSABLE) ×2
GUIDEWIRE ORTHO .059X5 (WIRE) IMPLANT
KIT BASIN OR (CUSTOM PROCEDURE TRAY) ×3 IMPLANT
KIT ROOM TURNOVER OR (KITS) ×3 IMPLANT
MANIFOLD NEPTUNE II (INSTRUMENTS) ×3 IMPLANT
NEEDLE HYPO 25X1 1.5 SAFETY (NEEDLE) ×3 IMPLANT
NS IRRIG 1000ML POUR BTL (IV SOLUTION) ×3 IMPLANT
PACK TOTAL JOINT (CUSTOM PROCEDURE TRAY) ×3 IMPLANT
PACK UNIVERSAL I (CUSTOM PROCEDURE TRAY) ×3 IMPLANT
PAD ARMBOARD 7.5X6 YLW CONV (MISCELLANEOUS) ×6 IMPLANT
PLATE LCP 16 HOLE 3.5 (Plate) ×3 IMPLANT
PLATE LEFT 16 H (Plate) ×3 IMPLANT
SCREW 2.3X12MM (Screw) ×6 IMPLANT
SCREW BN FT 16X2.3XLCK HEX CRT (Screw) ×1 IMPLANT
SCREW CORTEX 3.5 18MM (Screw) ×2 IMPLANT
SCREW CORTEX 3.5 20MM (Screw) ×2 IMPLANT
SCREW CORTEX 3.5 22MM (Screw) ×4 IMPLANT
SCREW CORTEX 3.5 24MM (Screw) ×4 IMPLANT
SCREW CORTEX 3.5 26MM (Screw) ×2 IMPLANT
SCREW CORTEX SLFTPNG 20MM 2.4 (Screw) ×3 IMPLANT
SCREW CORTEX SLFTPNG 24MM 2.4 (Screw) ×3 IMPLANT
SCREW CORTICAL LOCKING 2.3X14M (Screw) ×3 IMPLANT
SCREW CORTICAL LOCKING 2.3X16M (Screw) ×2 IMPLANT
SCREW LOCK 12X3.5X HEXALOBE (Screw) ×1 IMPLANT
SCREW LOCK CORT ST 3.5X18 (Screw) ×1 IMPLANT
SCREW LOCK CORT ST 3.5X20 (Screw) ×1 IMPLANT
SCREW LOCK CORT ST 3.5X22 (Screw) ×2 IMPLANT
SCREW LOCK CORT ST 3.5X24 (Screw) ×2 IMPLANT
SCREW LOCK CORT ST 3.5X26 (Screw) ×1 IMPLANT
SCREW LOCK T15 FT 24X3.5X2.9X (Screw) ×1 IMPLANT
SCREW LOCKING 3.5X10MM (Screw) ×3 IMPLANT
SCREW LOCKING 3.5X12 (Screw) ×2 IMPLANT
SCREW LOCKING 3.5X24 (Screw) ×2 IMPLANT
SCREW LOCKING 3.5X26 (Screw) ×3 IMPLANT
SCREW NONLOCK HEX 3.5X12 (Screw) ×6 IMPLANT
SPONGE GAUZE 4X4 12PLY STER LF (GAUZE/BANDAGES/DRESSINGS) ×3 IMPLANT
SPONGE LAP 18X18 X RAY DECT (DISPOSABLE) ×9 IMPLANT
STAPLER VISISTAT 35W (STAPLE) ×3 IMPLANT
STOCKINETTE IMPERVIOUS 9X36 MD (GAUZE/BANDAGES/DRESSINGS) ×3 IMPLANT
SUCTION FRAZIER HANDLE 10FR (MISCELLANEOUS) ×2
SUCTION TUBE FRAZIER 10FR DISP (MISCELLANEOUS) ×1 IMPLANT
SUT ETHIBOND 5 LR DA (SUTURE) ×3 IMPLANT
SUT ETHILON 3 0 PS 1 (SUTURE) ×9 IMPLANT
SUT MNCRL AB 3-0 PS2 18 (SUTURE) ×3 IMPLANT
SUT VIC AB 0 CT1 27 (SUTURE) ×4
SUT VIC AB 0 CT1 27XBRD ANBCTR (SUTURE) ×2 IMPLANT
SUT VIC AB 1 CT1 27 (SUTURE) ×4
SUT VIC AB 1 CT1 27XBRD ANBCTR (SUTURE) ×2 IMPLANT
SUT VIC AB 2-0 CT1 27 (SUTURE) ×8
SUT VIC AB 2-0 CT1 TAPERPNT 27 (SUTURE) ×4 IMPLANT
SUT VIC AB 3-0 FS2 27 (SUTURE) ×3 IMPLANT
SYR 5ML LL (SYRINGE) IMPLANT
SYR CONTROL 10ML LL (SYRINGE) ×3 IMPLANT
TAPE STRIPS DRAPE STRL (GAUZE/BANDAGES/DRESSINGS) ×3 IMPLANT
TOWEL OR 17X24 6PK STRL BLUE (TOWEL DISPOSABLE) ×3 IMPLANT
TOWEL OR 17X26 10 PK STRL BLUE (TOWEL DISPOSABLE) ×9 IMPLANT
TRAY FOLEY CATH 16FRSI W/METER (SET/KITS/TRAYS/PACK) IMPLANT
WATER STERILE IRR 1000ML POUR (IV SOLUTION) ×3 IMPLANT
YANKAUER SUCT BULB TIP NO VENT (SUCTIONS) ×3 IMPLANT

## 2015-10-10 NOTE — Anesthesia Procedure Notes (Signed)
Procedure Name: Intubation Date/Time: 10/10/2015 3:12 PM Performed by: Izora Gala Pre-anesthesia Checklist: Patient identified, Emergency Drugs available, Suction available and Patient being monitored Patient Re-evaluated:Patient Re-evaluated prior to inductionOxygen Delivery Method: Circle system utilized Preoxygenation: Pre-oxygenation with 100% oxygen Intubation Type: IV induction Ventilation: Mask ventilation without difficulty Laryngoscope Size: Miller and 3 Grade View: Grade I Tube type: Oral Tube size: 7.5 mm Number of attempts: 1 Airway Equipment and Method: Stylet,  LTA kit utilized and Bite block Placement Confirmation: ETT inserted through vocal cords under direct vision,  positive ETCO2 and breath sounds checked- equal and bilateral Secured at: 22 cm Tube secured with: Tape Dental Injury: Teeth and Oropharynx as per pre-operative assessment

## 2015-10-10 NOTE — Brief Op Note (Signed)
10/09/2015 - 10/10/2015  7:04 PM  PATIENT:  Krystal Glass  25 y.o. female  PRE-OPERATIVE DIAGNOSIS:   1. LEFT HUMERAL SHAFT FRACTURE 2. LEFT SUPRACONDYLAR HUMERUS FRACTURE 3. LEFT CLAVICLE FRACTURE 4. TRAUMATIC POSTERIOR ELBOW WOUND, CONTAMINATED  POST-OPERATIVE DIAGNOSIS:   1. LEFT HUMERAL SHAFT FRACTURE 2. LEFT SUPRACONDYLAR HUMERUS FRACTURE 3. LEFT CLAVICLE FRACTURE 4. TRAUMATIC POSTERIOR ELBOW WOUND, CONTAMINATED  PROCEDURE:  Procedure(s): 1. OPEN REDUCTION INTERNAL FIXATION (ORIF) LEFT HUMERAL FRACTURE 2. OPEN REDUCTION INTERNAL FIXATION (ORIF) LEFT SUPRACONDYLAR HUMERUS FRACTURE 3. ORIF CLAVICLE FRACTURE (Left) 4. DEBRIDEMENT OF OPEN CONTAMINATED ELBOW WOUND  SURGEON:  Surgeon(s) and Role:    * Myrene GalasMichael Anastasija Anfinson, MD - Primary  PHYSICIAN ASSISTANT: Montez MoritaKEITH PAUL, PAC  ANESTHESIA:   general  EBL:  Total I/O In: 400 [I.V.:400] Out: -   BLOOD ADMINISTERED:none  DRAINS: none   LOCAL MEDICATIONS USED:  NONE  SPECIMEN:  No Specimen  DISPOSITION OF SPECIMEN:  N/A  COUNTS:  YES  TOURNIQUET:    DICTATION: .Other Dictation: Dictation Number [id number]  PLAN OF CARE: Admit to inpatient   PATIENT DISPOSITION:  PACU - hemodynamically stable.   Delay start of Pharmacological VTE agent (>24hrs) due to surgical blood loss or risk of bleeding: no

## 2015-10-10 NOTE — Transfer of Care (Signed)
Immediate Anesthesia Transfer of Care Note  Patient: Krystal Glass  Procedure(s) Performed: Procedure(s): OPEN REDUCTION INTERNAL FIXATION (ORIF) LEFT HUMERUS AND CLAVICLE FRACTURE (Left)  Patient Location: PACU  Anesthesia Type:General and Regional  Level of Consciousness: awake, alert  and oriented  Airway & Oxygen Therapy: Patient Spontanous Breathing  Post-op Assessment: Report given to RN and Post -op Vital signs reviewed and stable  Post vital signs: Reviewed and stable  Last Vitals:  Vitals:   10/10/15 0427 10/10/15 1927  BP: 131/67 118/63  Pulse: 67 (!) 104  Resp:  20  Temp: 37 C 36.3 C    Last Pain:  Vitals:   10/10/15 1927  TempSrc:   PainSc: (P) 0-No pain         Complications: No apparent anesthesia complications

## 2015-10-10 NOTE — Anesthesia Preprocedure Evaluation (Signed)
Anesthesia Evaluation  Patient identified by MRN, date of birth, ID band Patient awake    Reviewed: Allergy & Precautions, NPO status , Patient's Chart, lab work & pertinent test results  History of Anesthesia Complications Negative for: history of anesthetic complications  Airway Mallampati: II  TM Distance: >3 FB Neck ROM: Full    Dental  (+) Teeth Intact   Pulmonary neg pulmonary ROS,    breath sounds clear to auscultation       Cardiovascular negative cardio ROS   Rhythm:Regular     Neuro/Psych negative neurological ROS  negative psych ROS   GI/Hepatic negative GI ROS, Neg liver ROS,   Endo/Other    Renal/GU negative Renal ROS     Musculoskeletal   Abdominal   Peds  Hematology negative hematology ROS (+)   Anesthesia Other Findings   Reproductive/Obstetrics                             Anesthesia Physical Anesthesia Plan  ASA: II  Anesthesia Plan: General   Post-op Pain Management:  Regional for Post-op pain   Induction: Intravenous  Airway Management Planned: Oral ETT  Additional Equipment: None  Intra-op Plan:   Post-operative Plan: Extubation in OR  Informed Consent: I have reviewed the patients History and Physical, chart, labs and discussed the procedure including the risks, benefits and alternatives for the proposed anesthesia with the patient or authorized representative who has indicated his/her understanding and acceptance.   Dental advisory given  Plan Discussed with: CRNA and Surgeon  Anesthesia Plan Comments:         Anesthesia Quick Evaluation

## 2015-10-10 NOTE — Anesthesia Procedure Notes (Signed)
Anesthesia Regional Block:  Interscalene brachial plexus block  Pre-Anesthetic Checklist: ,, timeout performed, Correct Patient, Correct Site, Correct Laterality, Correct Procedure, Correct Position, site marked, Risks and benefits discussed,  Surgical consent,  Pre-op evaluation,  At surgeon's request and post-op pain management  Laterality: Upper and Left  Prep: chloraprep       Needles:  Injection technique: Single-shot  Needle Type: Echogenic Stimulator Needle          Additional Needles:  Procedures: ultrasound guided (picture in chart) Interscalene brachial plexus block Narrative:  Injection made incrementally with aspirations every 5 mL.  Performed by: Personally  Anesthesiologist: Annamary Buschman  Additional Notes: H+P and labs reviewed, risks and benefits discussed with patient, procedure tolerated well without complications        
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
## 2015-10-10 NOTE — Progress Notes (Signed)
Patient ID: Krystal Glass, female   DOB: 03-09-1990, 25 y.o.   MRN: [id number] Comfortable this am.  [facility] and she does complain of pain there.  X-rays reviewed and she does have a left clavicle shaft fracture with no significant shortening or angulation thus far.  Left upper extremity more comfortable in the splint.  Her left radial nerve still shows a motor deficit.  To the OR today for ORIF of her left humerus.  Have consulted Ortho Trauma - Dr. Carola FrostHandy.

## 2015-10-10 NOTE — Anesthesia Postprocedure Evaluation (Signed)
Anesthesia Post Note  Patient: Krystal Glass  Procedure(s) Performed: Procedure(s) (LRB): OPEN REDUCTION INTERNAL FIXATION (ORIF) LEFT HUMERUS AND CLAVICLE FRACTURE (Left)  Patient location during evaluation: PACU Anesthesia Type: General Level of consciousness: awake Pain management: pain level controlled Respiratory status: spontaneous breathing Anesthetic complications: no    Last Vitals:  Vitals:   10/09/15 2243 10/10/15 0427  BP: 115/61 131/67  Pulse: 72 67  Resp:    Temp: 37.4 C 37 C    Last Pain:  Vitals:   10/10/15 1300  TempSrc:   PainSc: 4                  EDWARDS,Coley Littles

## 2015-10-11 LAB — BASIC METABOLIC PANEL
ANION GAP: 7 (ref 5–15)
BUN: <5 mg/dL — ABNORMAL LOW (ref 6–20)
CALCIUM: 8.7 mg/dL — AB (ref 8.9–10.3)
CO2: 29 mmol/L (ref 22–32)
CREATININE: 0.58 mg/dL (ref 0.44–1.00)
Chloride: 102 mmol/L (ref 101–111)
GFR calc Af Amer: >60 mL/min (ref >60)
GFR calc non Af Amer: >60 mL/min (ref >60)
GLUCOSE: 102 mg/dL — AB (ref 65–99)
Potassium: 3.7 mmol/L (ref 3.5–5.1)
Sodium: 138 mmol/L (ref 135–145)

## 2015-10-11 MED ORDER — ACETAMINOPHEN 500 MG PO TABS
1000.0000 mg | ORAL_TABLET | Freq: Four times a day (QID) | ORAL | Status: DC
  Administered 2015-10-11 – 2015-10-12 (×4): 1000 mg via ORAL
  Filled 2015-10-11 (×5): qty 2

## 2015-10-11 MED ORDER — OXYCODONE HCL 5 MG PO TABS
5.0000 mg | ORAL_TABLET | ORAL | Status: DC | PRN
  Administered 2015-10-11 – 2015-10-12 (×3): 15 mg via ORAL
  Filled 2015-10-11 (×3): qty 3

## 2015-10-11 MED ORDER — IOPAMIDOL (ISOVUE-300) INJECTION 61%
75.0000 mL | Freq: Once | INTRAVENOUS | Status: AC | PRN
  Administered 2015-10-09: 75 mL via INTRAVENOUS

## 2015-10-11 MED ORDER — HYDROMORPHONE HCL 1 MG/ML IJ SOLN: 0.5000 mg | INTRAMUSCULAR | Status: DC | PRN

## 2015-10-11 NOTE — Progress Notes (Signed)
Slight drainage noted to left upper extremity dressing site. MD notified of drainage while evaluating the pt this morning. No dressing change orders given

## 2015-10-11 NOTE — Progress Notes (Signed)
1.2mg  diludid used via pca pump up to this time since last checked. pca pump discontinued
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
## 2015-10-11 NOTE — Progress Notes (Signed)
OT NOTE  RN STAFF  Please check splint every 4 hours during shift ( remove splint , remove stockinette/ dressing present) to assess for: * pain * redness *swelling  If any symptoms above present remove splint for 15 minutes. If symptoms continue - keep the splint removed and notify OT staff [phone] immediately.   Keep the UE elevated at all times on pillows / towels.  Splint can be cleaned with warm soapy water and alcohol swab. Splint should not be placed in heat of any kind because the splint with mold into a new shape.   Pt is to wear splint at this time one hour at a time. Pt can doff sling in bed but must wear splint and sling for mobility.    Krystal Glass, Krystal Glass   OTR/L Pager: [phone] Office: [phone] .
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
## 2015-10-12 ENCOUNTER — Encounter (HOSPITAL_COMMUNITY): Payer: Self-pay | Admitting: Orthopedic Surgery

## 2015-10-12 DIAGNOSIS — S51012A Laceration without foreign body of left elbow, initial encounter: Secondary | ICD-10-CM | POA: Diagnosis present

## 2015-10-12 DIAGNOSIS — S42002A Fracture of unspecified part of left clavicle, initial encounter for closed fracture: Secondary | ICD-10-CM | POA: Diagnosis present

## 2015-10-12 DIAGNOSIS — G5632 Lesion of radial nerve, left upper limb: Secondary | ICD-10-CM | POA: Diagnosis present

## 2015-10-12 HISTORY — DX: Lesion of radial nerve, left upper limb: G56.32

## 2015-10-12 HISTORY — DX: Fracture of unspecified part of left clavicle, initial encounter for closed fracture: S42.002A

## 2015-10-12 LAB — BASIC METABOLIC PANEL
Anion gap: 8 (ref 5–15)
BUN: 5 mg/dL — AB (ref 6–20)
CALCIUM: 8.7 mg/dL — AB (ref 8.9–10.3)
CO2: 28 mmol/L (ref 22–32)
CREATININE: 0.62 mg/dL (ref 0.44–1.00)
Chloride: 101 mmol/L (ref 101–111)
GFR calc non Af Amer: >60 mL/min (ref >60)
GLUCOSE: 101 mg/dL — AB (ref 65–99)
Potassium: 3.7 mmol/L (ref 3.5–5.1)
Sodium: 137 mmol/L (ref 135–145)

## 2015-10-12 LAB — CBC
HEMATOCRIT: 36.3 % (ref 36.0–46.0)
Hemoglobin: 11.4 g/dL — ABNORMAL LOW (ref 12.0–15.0)
MCH: 27.9 pg (ref 26.0–34.0)
MCHC: 31.4 g/dL (ref 30.0–36.0)
MCV: 89 fL (ref 78.0–100.0)
Platelets: 274 10*3/uL (ref 150–400)
RBC: 4.08 MIL/uL (ref 3.87–5.11)
RDW: 13.5 % (ref 11.5–15.5)
WBC: 8.1 10*3/uL (ref 4.0–10.5)

## 2015-10-12 MED ORDER — GABAPENTIN 300 MG PO CAPS: 300.0000 mg | ORAL_CAPSULE | Freq: Two times a day (BID) | ORAL | 1 refills | Status: DC

## 2015-10-12 MED ORDER — OXYCODONE HCL 5 MG PO TABS: 5.0000 mg | ORAL_TABLET | ORAL | 0 refills | Status: DC | PRN

## 2015-10-12 MED ORDER — ACETAMINOPHEN 500 MG PO TABS: 500.0000 mg | ORAL_TABLET | Freq: Four times a day (QID) | ORAL | 0 refills | Status: DC

## 2015-10-12 MED ORDER — DOCUSATE SODIUM 100 MG PO CAPS: 100.0000 mg | ORAL_CAPSULE | Freq: Two times a day (BID) | ORAL | 0 refills | Status: DC
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
## 2015-10-12 NOTE — Care Management Note (Signed)
Case Management Note  Patient Details  Name: Krystal Glass MRN: [id number] Date of Birth: March 31, 1990  Subjective/Objective:                    Action/Plan:   Expected Discharge Date:                  Expected Discharge Plan:  Home/Self Care  In-House Referral:     Discharge planning Services  CM Consult, Medication Assistance, [facility]  Post Acute Care Choice:    Choice offered to:  Patient  DME Arranged:    DME Agency:     HH Arranged:    HH Agency:     Status of Service:  Completed, signed off  If discussed at [employer] Length of Stay Meetings, dates discussed:    Additional Comments:  Kingsley PlanWile, [name], RN 10/12/2015, 10:58 AM

## 2015-10-12 NOTE — Progress Notes (Signed)
Orthopedic Tech Progress Note Patient Details:  Krystal Glass [birth year] [id number] Dropped off Velcro cock up wrist splint and instructed patient on use. Patient ID: Krystal Glass, female   DOB: [birth year], 25 y.o.   MRN: [id number]   Krystal Glass 10/12/2015, 11:51 AM
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
## 2015-10-12 NOTE — Discharge Instructions (Signed)
Orthopaedic Trauma Service Discharge Instructions   General Discharge Instructions  WEIGHT BEARING STATUS: Nonweightbearing Left upper extremity   RANGE OF MOTION/ACTIVITY:  No active L shoulder abduction  Passive L shoulder abduction ok  Passive and active L shoulder flexion and extension  Unrestricted L elbow, forearm, wrist and hand ROM (active and passive)  Wound Care: daily wound care starting on 10/14/2015. See instructions below Discharge Wound Care Instructions  Do NOT apply any ointments, solutions or lotions to pin sites or surgical wounds.  These prevent needed drainage and even though solutions like hydrogen peroxide kill bacteria, they also damage cells lining the pin sites that help fight infection.  Applying lotions or ointments can keep the wounds moist and can cause them to breakdown and open up as well. This can increase the risk for infection. When in doubt call the office.  Surgical incisions should be dressed daily.  If any drainage is noted, use one layer of adaptic, then gauze, Kerlix, and an ace wrap.  Once the incision is completely dry and without drainage, it may be left open to air out.  Showering may begin 36-48 hours later.  Cleaning gently with soap and water.  Traumatic wounds should be dressed daily as well.    One layer of adaptic, gauze, Kerlix, then ace wrap.  The adaptic can be discontinued once the draining has ceased    If you have a wet to dry dressing: wet the gauze with saline the squeeze as much saline out so the gauze is moist (not soaking wet), place moistened gauze over wound, then place a dry gauze over the moist one, followed by Kerlix wrap, then ace wrap.  PAIN MEDICATION USE AND EXPECTATIONS  You have likely been given narcotic medications to help control your pain.  After a traumatic event that results in an fracture (broken bone) with or without surgery, it is ok to use narcotic pain medications to help control one's pain.  We  understand that everyone responds to pain differently and each individual patient will be evaluated on a regular basis for the continued need for narcotic medications. Ideally, narcotic medication use should last no more than 6-8 weeks (coinciding with fracture healing).   As a patient it is your responsibility as well to monitor narcotic medication use and report the amount and frequency you use these medications when you come to your office visit.   We would also advise that if you are using narcotic medications, you should take a dose prior to therapy to maximize you participation.  IF YOU ARE ON NARCOTIC MEDICATIONS IT IS NOT PERMISSIBLE TO OPERATE A MOTOR VEHICLE (MOTORCYCLE/CAR/TRUCK/MOPED) OR HEAVY MACHINERY DO NOT MIX NARCOTICS WITH OTHER CNS (CENTRAL NERVOUS SYSTEM) DEPRESSANTS SUCH AS ALCOHOL  Diet: as you were eating previously.  Can use over the counter stool softeners and bowel preparations, such as Miralax, to help with bowel movements.  Narcotics can be constipating.  Be sure to drink plenty of fluids    STOP SMOKING OR USING NICOTINE PRODUCTS!!!!  As discussed nicotine severely impairs your body's ability to heal surgical and traumatic wounds but also impairs bone healing.  Wounds and bone heal by forming microscopic blood vessels (angiogenesis) and nicotine is a vasoconstrictor (essentially, shrinks blood vessels).  Therefore, if vasoconstriction occurs to these microscopic blood vessels they essentially disappear and are unable to deliver necessary nutrients to the healing tissue.  This is one modifiable factor that you can do to dramatically increase your chances of healing your  injury.    (This means no smoking, no nicotine gum, patches, etc)  DO NOT USE NONSTEROIDAL ANTI-INFLAMMATORY DRUGS (NSAID'S)  Using products such as Advil (ibuprofen), Aleve (naproxen), Motrin (ibuprofen) for additional pain control during fracture healing can delay and/or prevent the healing response.  If  you would like to take over the counter (OTC) medication, Tylenol (acetaminophen) is ok.  However, some narcotic medications that are given for pain control contain acetaminophen as well. Therefore, you should not exceed more than 4000 mg of tylenol in a day if you do not have liver disease.  Also note that there are may OTC medicines, such as cold medicines and allergy medicines that my contain tylenol as well.  If you have any questions about medications and/or interactions please ask your doctor/PA or your pharmacist.      ICE AND ELEVATE INJURED/OPERATIVE EXTREMITY  Using ice and elevating the injured extremity above your heart can help with swelling and pain control.  Icing in a pulsatile fashion, such as 20 minutes on and 20 minutes off, can be followed.    Do not place ice directly on skin. Make sure there is a barrier between to skin and the ice pack.    Using frozen items such as frozen peas works well as the conform nicely to the are that needs to be iced.  USE AN ACE WRAP OR TED HOSE FOR SWELLING CONTROL  In addition to icing and elevation, Ace wraps or TED hose are used to help limit and resolve swelling.  It is recommended to use Ace wraps or TED hose until you are informed to stop.    When using Ace Wraps start the wrapping distally (farthest away from the body) and wrap proximally (closer to the body)   Example: If you had surgery on your leg or thing and you do not have a splint on, start the ace wrap at the toes and work your way up to the thigh        If you had surgery on your upper extremity and do not have a splint on, start the ace wrap at your fingers and work your way up to the upper arm  IF YOU ARE IN A SPLINT OR CAST DO NOT REMOVE IT FOR ANY REASON   If your splint gets wet for any reason please contact the office immediately. You may shower in your splint or cast as long as you keep it dry.  This can be done by wrapping in a cast cover or garbage back (or similar)  Do Not  stick any thing down your splint or cast such as pencils, money, or hangers to try and scratch yourself with.  If you feel itchy take benadryl as prescribed on the bottle for itching  IF YOU ARE IN A CAM BOOT (BLACK BOOT)  You may remove boot periodically. Perform daily dressing changes as noted below.  Wash the liner of the boot regularly and wear a sock when wearing the boot. It is recommended that you sleep in the boot until told otherwise  CALL THE OFFICE WITH ANY QUESTIONS OR CONCERNS: [phone]

## 2015-10-13 LAB — VITAMIN D 25 HYDROXY (VIT D DEFICIENCY, FRACTURES): Vit D, 25-Hydroxy: 13.4 ng/mL — ABNORMAL LOW (ref 30.0–100.0)
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
## 2015-11-20 DIAGNOSIS — E559 Vitamin D deficiency, unspecified: Secondary | ICD-10-CM
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
## 2016-11-14 ENCOUNTER — Other Ambulatory Visit (INDEPENDENT_AMBULATORY_CARE_PROVIDER_SITE_OTHER): Payer: Self-pay

## 2016-11-14 DIAGNOSIS — Z3481 Encounter for supervision of other normal pregnancy, first trimester: Secondary | ICD-10-CM

## 2016-11-14 LAB — POCT URINALYSIS DIP (MANUAL ENTRY)
Bilirubin, UA: NEGATIVE
Glucose, UA: NEGATIVE mg/dL
Nitrite, UA: NEGATIVE
PH UA: 6 (ref 5.0–8.0)
Protein Ur, POC: NEGATIVE mg/dL
Urobilinogen, UA: 0.2 E.U./dL

## 2016-11-14 LAB — POCT UA - MICROSCOPIC ONLY

## 2016-11-15 LAB — OBSTETRIC PANEL, INCLUDING HIV
Antibody Screen: NEGATIVE
Basophils Absolute: 0 10*3/uL (ref 0.0–0.2)
Basos: 0 %
EOS (ABSOLUTE): 0.1 10*3/uL (ref 0.0–0.4)
EOS: 1 %
HEMOGLOBIN: 12.5 g/dL (ref 11.1–15.9)
HEP B S AG: NEGATIVE
HIV Screen 4th Generation wRfx: NONREACTIVE
Hematocrit: 36.1 % (ref 34.0–46.6)
IMMATURE GRANS (ABS): 0 10*3/uL (ref 0.0–0.1)
IMMATURE GRANULOCYTES: 0 %
LYMPHS: 30 %
Lymphocytes Absolute: 2.2 10*3/uL (ref 0.7–3.1)
MCH: 28.9 pg (ref 26.6–33.0)
MCHC: 34.6 g/dL (ref 31.5–35.7)
MCV: 84 fL (ref 79–97)
MONOCYTES: 7 %
Monocytes Absolute: 0.5 10*3/uL (ref 0.1–0.9)
NEUTROS PCT: 62 %
Neutrophils Absolute: 4.5 10*3/uL (ref 1.4–7.0)
Platelets: 347 10*3/uL (ref 150–379)
RBC: 4.32 x10E6/uL (ref 3.77–5.28)
RDW: 14 % (ref 12.3–15.4)
RH TYPE: POSITIVE
RPR: NONREACTIVE
RUBELLA: 4.92 {index} (ref >0.99)
WBC: 7.4 10*3/uL (ref 3.4–10.8)

## 2016-11-15 LAB — SICKLE CELL SCREEN: Sickle Cell Screen: NEGATIVE

## 2016-11-16 LAB — URINE CULTURE, OB REFLEX

## 2016-11-16 LAB — CULTURE, OB URINE

## 2016-11-17 ENCOUNTER — Encounter: Payer: Self-pay | Admitting: Family Medicine

## 2016-11-17 NOTE — Progress Notes (Signed)
Lactobacillus bacteremia > 100000 CFU. Likely contaminant from normal flora. According to recommendation;If bacteria that are not typical uropathogens (such as lactobacillus) are isolated, treatment should be reserved for patients in whom the organism grows as a single isolate on consecutive cultures.  Patient has an upcoming appointment with Dr. Abelardo Diesel. Will recommend repeat urine culture with appropriate clean catch technique. Also, if she is having UTI symptoms, will recommend treatment any ways.

## 2016-11-21 ENCOUNTER — Other Ambulatory Visit (HOSPITAL_COMMUNITY): Admission: RE | Admit: 2016-11-21 | Payer: Self-pay | Source: Ambulatory Visit

## 2016-11-21 ENCOUNTER — Encounter: Payer: Self-pay | Admitting: Family Medicine

## 2016-11-21 ENCOUNTER — Ambulatory Visit (INDEPENDENT_AMBULATORY_CARE_PROVIDER_SITE_OTHER): Payer: Self-pay | Admitting: Family Medicine

## 2016-11-21 VITALS — BP 102/54 | HR 82 | Temp 98.7°F | Wt 231.6 lb

## 2016-11-21 DIAGNOSIS — O0993 Supervision of high risk pregnancy, unspecified, third trimester: Secondary | ICD-10-CM | POA: Insufficient documentation

## 2016-11-21 DIAGNOSIS — R7302 Impaired glucose tolerance (oral): Secondary | ICD-10-CM

## 2016-11-21 DIAGNOSIS — O0930 Supervision of pregnancy with insufficient antenatal care, unspecified trimester: Secondary | ICD-10-CM | POA: Insufficient documentation

## 2016-11-21 DIAGNOSIS — R7309 Other abnormal glucose: Secondary | ICD-10-CM

## 2016-11-21 DIAGNOSIS — Z3401 Encounter for supervision of normal first pregnancy, first trimester: Secondary | ICD-10-CM

## 2016-11-21 LAB — POCT URINALYSIS DIP (MANUAL ENTRY)
Bilirubin, UA: NEGATIVE
Glucose, UA: NEGATIVE mg/dL
Ketones, POC UA: NEGATIVE mg/dL
Nitrite, UA: NEGATIVE
Spec Grav, UA: >=1.03 — AB (ref 1.010–1.025)
UROBILINOGEN UA: 0.2 U/dL
pH, UA: 5.5 (ref 5.0–8.0)

## 2016-11-21 NOTE — Patient Instructions (Signed)
Thank you for coming in to see [place] today. Please see below to review our plan for today's visit.  1. Continue the prenatal vitamin daily. 2. We will check you for gestational diabetes, obtain a genetic screen which I will call you if there are any abnormalities. 3. Please call [phone] to schedule your perinatal and child birthing classes at the [facility]. 4. You will need to get your prenatal anatomy scan. 4. Return in 4 weeks for your next OB visit.  Please call the clinic at [phone] if your symptoms worsen or you have any concerns. It was my pleasure to see you. -- Durward Parcel, DO [facility], PGY-2

## 2016-11-21 NOTE — Progress Notes (Signed)
Krystal Glass is a 26 y.o. yo No obstetric history on file. at Unknown who presents for her initial prenatal visit. Pregnancy is planned She reports breast tenderness, fatigue, frequent urination and positive home pregnancy test. She  is taking PNV. See flow sheet for details.  PMH, POBH, FH, meds, allergies and Social Hx reviewed.  Prenatal Exam: Gen: Well nourished, well developed.  No distress.  Vitals noted. HEENT: Normocephalic, atraumatic.  Neck supple without cervical lymphadenopathy, thyromegaly or thyroid nodules.  Fair dentition. CV: RRR no murmur, gallops or rubs Lungs: CTAB.  Normal respiratory effort without wheezes or rales. Abd: soft, NTND. +BS.  Uterus not appreciated above pelvis. GU: Normal external female genitalia without lesions.  Normal vaginal, well rugated without lesions. Minimal white vaginal discharge.  Bimanual exam: No adnexal mass or TTP. No CMT.  Uterus size appropriate Ext: No clubbing, cyanosis or edema. Psych: Normal grooming and dress.  Not depressed or anxious appearing.  Normal thought content and process without flight of ideas or looseness of associations.  Assessment & Plan: 1) 26 y.o. yo No obstetric history on file. [redacted]w[redacted]d via LMP doing well.  Current pregnancy issues include none. Dating is reliable. Prenatal labs reviewed, no abnormalities. Genetic screening offered: yes. Early glucola is indicated. Will get at today's visit. PHQ-9 and Pregnancy Medical Home forms completed and reviewed.  Bleeding and pain precautions reviewed. Importance of prenatal vitamins reviewed.  Follow up in 4 weeks.

## 2016-11-22 LAB — CYTOLOGY - PAP
ADEQUACY: ABSENT
DIAGNOSIS: NEGATIVE

## 2016-11-22 LAB — CERVICOVAGINAL ANCILLARY ONLY
Chlamydia: NEGATIVE
NEISSERIA GONORRHEA: NEGATIVE

## 2016-11-26 ENCOUNTER — Other Ambulatory Visit: Payer: Self-pay | Admitting: Family Medicine

## 2016-11-26 ENCOUNTER — Other Ambulatory Visit (INDEPENDENT_AMBULATORY_CARE_PROVIDER_SITE_OTHER): Payer: Self-pay

## 2016-11-26 DIAGNOSIS — O24419 Gestational diabetes mellitus in pregnancy, unspecified control: Secondary | ICD-10-CM

## 2016-11-26 DIAGNOSIS — O0930 Supervision of pregnancy with insufficient antenatal care, unspecified trimester: Secondary | ICD-10-CM

## 2016-11-26 DIAGNOSIS — R7309 Other abnormal glucose: Secondary | ICD-10-CM

## 2016-11-26 DIAGNOSIS — R7302 Impaired glucose tolerance (oral): Secondary | ICD-10-CM

## 2016-11-26 DIAGNOSIS — Z3401 Encounter for supervision of normal first pregnancy, first trimester: Secondary | ICD-10-CM

## 2016-11-26 LAB — CULTURE, OB URINE

## 2016-11-26 LAB — POCT CBG (FASTING - GLUCOSE)-MANUAL ENTRY: Glucose Fasting, POC: 103 mg/dL — AB (ref 70–99)

## 2016-11-26 LAB — POCT 1 HR PRENATAL GLUCOSE: GLUCOSE 1 HR PRENATAL, POC: 187 mg/dL

## 2016-11-26 NOTE — Addendum Note (Signed)
Addended by: Jone Baseman D on: 11/26/2016 08:51 AM   Modules accepted: Orders

## 2016-11-26 NOTE — Addendum Note (Signed)
Addended by: Jennette Bill on: 11/26/2016 03:20 PM   Modules accepted: Orders

## 2016-11-27 LAB — GESTATIONAL GLUCOSE TOLERANCE
GLUCOSE 3 HOUR GTT: 101 mg/dL (ref 65–139)
Glucose, Fasting: 91 mg/dL (ref 65–94)
Glucose, GTT - 1 Hour: 190 mg/dL — ABNORMAL HIGH (ref 65–179)
Glucose, GTT - 2 Hour: 128 mg/dL (ref 65–154)

## 2016-11-28 LAB — URINE CULTURE, OB REFLEX

## 2016-11-28 LAB — CULTURE, OB URINE

## 2016-12-16 ENCOUNTER — Telehealth: Payer: Self-pay | Admitting: Family Medicine

## 2016-12-16 NOTE — Telephone Encounter (Signed)
Pt called and said she was told by her dr at her last visit(11/21/16) that dr was going to be [facility] an [facility]. She hasnt been notified about an app yet. She's concerned since its been a month and she still hasnt heard if she has an app.

## 2016-12-23 ENCOUNTER — Encounter: Payer: Self-pay | Admitting: Family Medicine

## 2016-12-23 NOTE — Telephone Encounter (Signed)
Discussed with Annice PihJackie. She will follow up with appointment. Thanks.

## 2016-12-24 ENCOUNTER — Other Ambulatory Visit: Payer: Self-pay

## 2016-12-24 ENCOUNTER — Ambulatory Visit (INDEPENDENT_AMBULATORY_CARE_PROVIDER_SITE_OTHER): Payer: Self-pay | Admitting: Family Medicine

## 2016-12-24 VITALS — BP 104/60 | HR 90 | Temp 98.2°F | Wt 236.0 lb

## 2016-12-24 DIAGNOSIS — Z3402 Encounter for supervision of normal first pregnancy, second trimester: Secondary | ICD-10-CM

## 2016-12-24 DIAGNOSIS — O24419 Gestational diabetes mellitus in pregnancy, unspecified control: Secondary | ICD-10-CM

## 2016-12-24 NOTE — Patient Instructions (Signed)
Thank you for coming in to see us today. Please see below to review our plan for today's visit.  1.  Important that you avoid eating and drinking excessive carbs.   2.  We will work on getting you scheduled with a high risk OB for your gestational diabetes.  You will follow-up with them in 2 weeks.  Please call the clinic at [phone] if your symptoms worsen or you have any concerns. It was our pleasure to serve you.  Durward Parcelavid Willye Javier, DO [facility], PGY-2

## 2016-12-24 NOTE — Progress Notes (Signed)
Krystal Glass is a 26 y.o. G1P0000 at 162w0d here for routine follow up.  Krystal Glass reports fetal movement. without vaginal bleeding or leaking or contractions. See flow sheet for details.  A/P: Pregnancy at 282w0d.  Doing well.   Pregnancy issues include none Anatomy scan reviewed, problems are not noted.  Preterm labor precautions reviewed. Follow up 4 weeks.

## 2016-12-26 ENCOUNTER — Encounter: Payer: Self-pay | Attending: Family Medicine | Admitting: *Deleted

## 2016-12-26 ENCOUNTER — Ambulatory Visit: Payer: Self-pay | Admitting: *Deleted

## 2016-12-26 DIAGNOSIS — Z713 Dietary counseling and surveillance: Secondary | ICD-10-CM | POA: Insufficient documentation

## 2016-12-26 DIAGNOSIS — R7302 Impaired glucose tolerance (oral): Secondary | ICD-10-CM | POA: Insufficient documentation

## 2016-12-26 DIAGNOSIS — R7309 Other abnormal glucose: Secondary | ICD-10-CM

## 2016-12-26 NOTE — Progress Notes (Signed)
  Patient was seen on 12/26/2016 for Gestational Diabetes self-management. She states history of PCOS. EDD is early March, 04/15/2017. No history of GDM but she states her grandmother had type 2 diabetes. Diet history obtained, eating habits appear appropriate and she only drinks water.  The following learning objectives were met by the patient :   States the definition of Gestational Diabetes  States why dietary management is important in controlling blood glucose  Describes the effects of carbohydrates on blood glucose levels  Demonstrates ability to create a balanced meal plan  Demonstrates carbohydrate counting   States when to check blood glucose levels  Demonstrates proper blood glucose monitoring techniques  States the effect of stress and exercise on blood glucose levels  States the importance of limiting caffeine and abstaining from alcohol and smoking  Plan:  Aim for 3 Carb Choices per meal (45 grams) +/- 1 either way  Aim for 1-2 Carbs per snack Begin reading food labels for Total Carbohydrate of foods Consider  increasing your activity level by walking or other activity daily as tolerated Begin checking BG before breakfast and 2 hours after first bite of breakfast, lunch and dinner as directed by MD  Bring Log Book to every medical appointment unless recording BG into Babyscripts Take medication if directed by MD  Patient interested in using Babyscripts to record BG. I signed her up today.  Blood glucose monitor given: True Track Lot # [id number] Exp: 02/01/2019 Blood glucose reading: 84 mg/dl  Patient instructed to monitor glucose levels: FBS: 60 - 95 mg/dl 2 hour: <120 mg/dl  Patient received the following handouts:  Nutrition Diabetes and Pregnancy  Carbohydrate Counting List  Patient will be seen for follow-up as needed.

## 2016-12-31 ENCOUNTER — Ambulatory Visit: Payer: Self-pay

## 2017-01-06 ENCOUNTER — Ambulatory Visit (INDEPENDENT_AMBULATORY_CARE_PROVIDER_SITE_OTHER): Payer: Self-pay | Admitting: Obstetrics and Gynecology

## 2017-01-06 ENCOUNTER — Encounter: Payer: Self-pay | Admitting: Obstetrics and Gynecology

## 2017-01-06 VITALS — BP 102/62 | HR 75 | Wt 235.8 lb

## 2017-01-06 DIAGNOSIS — Z3402 Encounter for supervision of normal first pregnancy, second trimester: Secondary | ICD-10-CM

## 2017-01-06 DIAGNOSIS — E559 Vitamin D deficiency, unspecified: Secondary | ICD-10-CM

## 2017-01-06 DIAGNOSIS — O0932 Supervision of pregnancy with insufficient antenatal care, second trimester: Secondary | ICD-10-CM

## 2017-01-06 DIAGNOSIS — O0930 Supervision of pregnancy with insufficient antenatal care, unspecified trimester: Secondary | ICD-10-CM

## 2017-01-06 DIAGNOSIS — O24419 Gestational diabetes mellitus in pregnancy, unspecified control: Secondary | ICD-10-CM

## 2017-01-06 DIAGNOSIS — O0990 Supervision of high risk pregnancy, unspecified, unspecified trimester: Secondary | ICD-10-CM

## 2017-01-06 MED ORDER — METFORMIN HCL 500 MG PO TABS: 500.0000 mg | ORAL_TABLET | Freq: Two times a day (BID) | ORAL | 1 refills | Status: DC

## 2017-01-06 MED ORDER — PRENATAL PLUS 27-1 MG PO TABS: 1.0000 | ORAL_TABLET | Freq: Every day | ORAL | 6 refills | Status: DC

## 2017-01-06 NOTE — Progress Notes (Signed)
   PRENATAL VISIT NOTE - transfer from [facility] for gDM  Subjective:  Krystal Glass is a 26 y.o. G1P0000 at 934w6d being seen today for ongoing prenatal care.  She is currently monitored for the following issues for this high-risk pregnancy and has Vitamin D deficiency; Late prenatal care; Encounter for prenatal care in second trimester of first pregnancy; and Gestational diabetes on their problem list.  Patient reports no complaints.  Contractions: Not present. Vag. Bleeding: None.  Movement: Present. Denies leaking of fluid.   She has unremarkable history with no health issues. [facility]DM diagnosed at [facility], saw nutrition there.   The following portions of the patient's history were reviewed and updated as appropriate: allergies, current medications, past family history, past medical history, past social history, past surgical history and problem list. Problem list updated.  Objective:   Vitals:   01/06/17 0958 01/06/17 1009  BP: (!) 84/70 102/62  Pulse: 73 75  Weight: 235 lb 12.8 oz (107 kg)     Fetal Status: Fetal Heart Rate (bpm): 138   Movement: Present     General:  Alert, oriented and cooperative. Patient is in no acute distress.  Skin: Skin is warm and dry. No rash noted.   Cardiovascular: Normal heart rate noted  Respiratory: Normal respiratory effort, no problems with respiration noted  Abdomen: Soft, gravid, appropriate for gestational age.  Pain/Pressure: Present     Pelvic: Cervical exam deferred        Extremities: Normal range of motion.  Edema: None  Mental Status:  Normal mood and affect. Normal behavior. Normal judgment and thought content.   Assessment and Plan:  Pregnancy: G1P0000 at 6134w6d  1. Supervision of high risk pregnancy, antepartum [facility] - will request records Late prenatal care  2. Gestational diabetes mellitus (GDM) in second trimester, gestational diabetes method of control unspecified No  meds FG 91-107, PP mostly < 130 Will start metformin 500 mg BID Follow up growth US scheduled  3. Late prenatal care Patient reports [place] at [facility] changed her dates, have requested records   Preterm labor symptoms and general obstetric precautions including but not limited to vaginal bleeding, contractions, leaking of fluid and fetal movement were reviewed in detail with the patient. Please refer to After Visit Summary for other counseling recommendations.  Return in about 2 days (around 01/08/2017) for OB visit (MD).   Conan BowensKelly M Inda Mcglothen, MD

## 2017-01-22 ENCOUNTER — Ambulatory Visit: Payer: Self-pay

## 2017-01-22 ENCOUNTER — Encounter: Payer: Self-pay | Admitting: Obstetrics and Gynecology

## 2017-01-22 ENCOUNTER — Ambulatory Visit (INDEPENDENT_AMBULATORY_CARE_PROVIDER_SITE_OTHER): Payer: Self-pay | Admitting: Obstetrics and Gynecology

## 2017-01-22 VITALS — BP 121/65 | HR 74 | Wt 235.5 lb

## 2017-01-22 DIAGNOSIS — Z23 Encounter for immunization: Secondary | ICD-10-CM

## 2017-01-22 DIAGNOSIS — O4443 Low lying placenta NOS or without hemorrhage, third trimester: Secondary | ICD-10-CM

## 2017-01-22 DIAGNOSIS — Z3402 Encounter for supervision of normal first pregnancy, second trimester: Secondary | ICD-10-CM

## 2017-01-22 DIAGNOSIS — O4440 Low lying placenta NOS or without hemorrhage, unspecified trimester: Secondary | ICD-10-CM | POA: Insufficient documentation

## 2017-01-22 DIAGNOSIS — O368120 Decreased fetal movements, second trimester, not applicable or unspecified: Secondary | ICD-10-CM

## 2017-01-22 DIAGNOSIS — O0990 Supervision of high risk pregnancy, unspecified, unspecified trimester: Secondary | ICD-10-CM

## 2017-01-22 DIAGNOSIS — O24419 Gestational diabetes mellitus in pregnancy, unspecified control: Secondary | ICD-10-CM

## 2017-01-22 MED ORDER — TETANUS-DIPHTH-ACELL PERTUSSIS 5-2.5-18.5 LF-MCG/0.5 IM SUSP
0.5000 mL | Freq: Once | INTRAMUSCULAR | Status: AC
  Administered 2017-01-22: 0.5 mL via INTRAMUSCULAR

## 2017-01-22 NOTE — Progress Notes (Signed)
28 wk packet given  tdap vaccine given  Educated pt on Benefits of Breastfeeding for [employer]

## 2017-01-22 NOTE — Progress Notes (Signed)
   PRENATAL VISIT NOTE  Subjective:  Krystal Glass is a [age] y.o. G1P0000 at 2582w1d being seen today for ongoing prenatal care.  She is currently monitored for the following issues for this high-risk pregnancy and has Vitamin D deficiency; Late prenatal care; Encounter for prenatal care in second trimester of first pregnancy; Gestational diabetes; and Low-lying placenta on their problem list.  Patient reports markedly decreased movement over the last few days.  Contractions: Not present. Vag. Bleeding: None.  Movement: Present. Denies leaking of fluid.   Metformin 500 mg once daily (prescribed BID but patient taking incorrectly) FG: 76-96 (one elevated) PP: 70s-135 (only 2 elevated)  The following portions of the patient's history were reviewed and updated as appropriate: allergies, current medications, past family history, past medical history, past social history, past surgical history and problem list. Problem list updated.  Objective:   Vitals:   01/22/17 1455  BP: 121/65  Pulse: 74  Weight: 235 lb 8 oz (106.8 kg)    Fetal Status: Fetal Heart Rate (bpm): 145   Movement: Present     General:  Alert, oriented and cooperative. Patient is in no acute distress.  Skin: Skin is warm and dry. No rash noted.   Cardiovascular: Normal heart rate noted  Respiratory: Normal respiratory effort, no problems with respiration noted  Abdomen: Soft, gravid, appropriate for gestational age.  Pain/Pressure: Present     Pelvic: Cervical exam deferred        Extremities: Normal range of motion.  Edema: None  Mental Status:  Normal mood and affect. Normal behavior. Normal judgment and thought content.   Assessment and Plan:  Pregnancy: G1P0000 at 982w1d  1. Supervision of high risk pregnancy, antepartum - RPR - CBC - HIV antibody (with reflex) - Vitamin D (25 hydroxy) [facility] 2nd trimester scan was 6 days difference from LMP, will keep LMP dating  3. Gestational diabetes mellitus  (GDM) in second trimester, gestational diabetes method of control unspecified Metformin 500 mg daily Much improved cont, cont current dose Growth scan 01/31/17  4. Low-lying placenta Repeat scan 01/31/17  5. Decreased fetal movements in second trimester, single or unspecified fetus BPP 6/8 today Reactive NST Reviewed precautions  Preterm labor symptoms and general obstetric precautions including but not limited to vaginal bleeding, contractions, leaking of fluid and fetal movement were reviewed in detail with the patient. Please refer to After Visit Summary for other counseling recommendations.  Return in about 2 weeks (around 02/05/2017) for OB visit (MD).   Conan BowensKelly M Davis, MD

## 2017-01-23 ENCOUNTER — Encounter (HOSPITAL_COMMUNITY): Payer: Self-pay | Admitting: Family Medicine

## 2017-01-23 LAB — POCT URINALYSIS DIP (DEVICE)
BILIRUBIN URINE: NEGATIVE
GLUCOSE, UA: NEGATIVE mg/dL
HGB URINE DIPSTICK: NEGATIVE
KETONES UR: NEGATIVE mg/dL
Nitrite: NEGATIVE
Protein, ur: NEGATIVE mg/dL
SPECIFIC GRAVITY, URINE: 1.02 (ref 1.005–1.030)
UROBILINOGEN UA: 0.2 mg/dL (ref 0.0–1.0)
pH: 7 (ref 5.0–8.0)

## 2017-01-23 LAB — CBC
Hematocrit: 36.2 % (ref 34.0–46.6)
Hemoglobin: 12.3 g/dL (ref 11.1–15.9)
MCH: 29 pg (ref 26.6–33.0)
MCHC: 34 g/dL (ref 31.5–35.7)
MCV: 85 fL (ref 79–97)
PLATELETS: 361 10*3/uL (ref 150–379)
RBC: 4.24 x10E6/uL (ref 3.77–5.28)
RDW: 14.2 % (ref 12.3–15.4)
WBC: 7.1 10*3/uL (ref 3.4–10.8)

## 2017-01-23 LAB — HIV ANTIBODY (ROUTINE TESTING W REFLEX): HIV Screen 4th Generation wRfx: NONREACTIVE

## 2017-01-23 LAB — RPR: RPR Ser Ql: NONREACTIVE

## 2017-01-23 LAB — VITAMIN D 25 HYDROXY (VIT D DEFICIENCY, FRACTURES): Vit D, 25-Hydroxy: 22 ng/mL — ABNORMAL LOW (ref 30.0–100.0)

## 2017-01-24 ENCOUNTER — Telehealth: Payer: Self-pay

## 2017-01-24 NOTE — Telephone Encounter (Signed)
Pt called and stated that she left without getting her test strips or lancets on the day of her visit. Contacted pt and informed her that our manager will be in the office if she could here in the next hour.  Pt states that she has left to go out of town so she just come pick up on Monday.

## 2017-01-31 ENCOUNTER — Other Ambulatory Visit: Payer: Self-pay | Admitting: Obstetrics and Gynecology

## 2017-01-31 ENCOUNTER — Other Ambulatory Visit (HOSPITAL_COMMUNITY): Payer: Self-pay | Admitting: *Deleted

## 2017-01-31 ENCOUNTER — Encounter (HOSPITAL_COMMUNITY): Payer: Self-pay

## 2017-01-31 ENCOUNTER — Ambulatory Visit (HOSPITAL_COMMUNITY)
Admission: RE | Admit: 2017-01-31 | Discharge: 2017-01-31 | Disposition: A | Discharge status: Home / Self Care | Payer: Self-pay | Source: Ambulatory Visit | Attending: Obstetrics and Gynecology | Admitting: Obstetrics and Gynecology

## 2017-01-31 DIAGNOSIS — O99213 Obesity complicating pregnancy, third trimester: Secondary | ICD-10-CM

## 2017-01-31 DIAGNOSIS — O0990 Supervision of high risk pregnancy, unspecified, unspecified trimester: Secondary | ICD-10-CM

## 2017-01-31 DIAGNOSIS — O24419 Gestational diabetes mellitus in pregnancy, unspecified control: Secondary | ICD-10-CM

## 2017-01-31 DIAGNOSIS — O24415 Gestational diabetes mellitus in pregnancy, controlled by oral hypoglycemic drugs: Secondary | ICD-10-CM

## 2017-01-31 DIAGNOSIS — O0993 Supervision of high risk pregnancy, unspecified, third trimester: Secondary | ICD-10-CM | POA: Insufficient documentation

## 2017-01-31 DIAGNOSIS — O4440 Low lying placenta NOS or without hemorrhage, unspecified trimester: Secondary | ICD-10-CM

## 2017-01-31 DIAGNOSIS — Z3A28 28 weeks gestation of pregnancy: Secondary | ICD-10-CM | POA: Insufficient documentation

## 2017-01-31 DIAGNOSIS — Z3689 Encounter for other specified antenatal screening: Secondary | ICD-10-CM | POA: Insufficient documentation

## 2017-01-31 DIAGNOSIS — E669 Obesity, unspecified: Secondary | ICD-10-CM | POA: Insufficient documentation

## 2017-01-31 DIAGNOSIS — Z3A29 29 weeks gestation of pregnancy: Secondary | ICD-10-CM

## 2017-01-31 DIAGNOSIS — O4443 Low lying placenta NOS or without hemorrhage, third trimester: Secondary | ICD-10-CM | POA: Insufficient documentation

## 2017-01-31 HISTORY — DX: Gestational diabetes mellitus in pregnancy, unspecified control: O24.419

## 2017-02-06 ENCOUNTER — Ambulatory Visit (INDEPENDENT_AMBULATORY_CARE_PROVIDER_SITE_OTHER): Payer: Self-pay | Admitting: Obstetrics and Gynecology

## 2017-02-06 VITALS — BP 107/82 | HR 113 | Wt 235.5 lb

## 2017-02-06 DIAGNOSIS — O24410 Gestational diabetes mellitus in pregnancy, diet controlled: Secondary | ICD-10-CM

## 2017-02-06 DIAGNOSIS — Z23 Encounter for immunization: Secondary | ICD-10-CM

## 2017-02-06 DIAGNOSIS — O4443 Low lying placenta NOS or without hemorrhage, third trimester: Secondary | ICD-10-CM

## 2017-02-06 DIAGNOSIS — Z3402 Encounter for supervision of normal first pregnancy, second trimester: Secondary | ICD-10-CM

## 2017-02-06 DIAGNOSIS — O4440 Low lying placenta NOS or without hemorrhage, unspecified trimester: Secondary | ICD-10-CM

## 2017-02-06 NOTE — Progress Notes (Signed)
   PRENATAL VISIT NOTE  Subjective:  Krystal Glass is a 26 y.o. G1P0000 at 3262w3d being seen today for ongoing prenatal care.  She is currently monitored for the following issues for this low-risk pregnancy and has Vitamin D deficiency; Late prenatal care; Encounter for prenatal care in second trimester of first pregnancy; and Gestational diabetes on their problem list.  Patient reports no complaints.  Contractions: Not present. Vag. Bleeding: None.  Movement: Present. Denies leaking of fluid.   Metformin 500 mg once daily (prescribed BID but patient taking incorrectly) FG: 82-114 PP: 89-139  The following portions of the patient's history were reviewed and updated as appropriate: allergies, current medications, past family history, past medical history, past social history, past surgical history and problem list. Problem list updated.  Objective:   Vitals:   02/06/17 1357  BP: 107/82  Pulse: (!) 113  Weight: 235 lb 8 oz (106.8 kg)    Fetal Status: Fetal Heart Rate (bpm): 142   Movement: Present     General:  Alert, oriented and cooperative. Patient is in no acute distress.  Skin: Skin is warm and dry. No rash noted.   Cardiovascular: Normal heart rate noted  Respiratory: Normal respiratory effort, no problems with respiration noted  Abdomen: Soft, gravid, appropriate for gestational age.  Pain/Pressure: Present     Pelvic: Cervical exam deferred        Extremities: Normal range of motion.  Edema: Trace  Mental Status:  Normal mood and affect. Normal behavior. Normal judgment and thought content.   Assessment and Plan:  Pregnancy: G1P0000 at 262w3d  1. Encounter for prenatal care in second trimester of first pregnancy Flu shot today  2. gestational diabetes mellitus (GDM), antepartum, type 2 Metformin 500 mg daily Not well controlled Start metformin BID   3. Low-lying placenta Resolved on US 12/21  Preterm labor symptoms and general obstetric precautions including but  not limited to vaginal bleeding, contractions, leaking of fluid and fetal movement were reviewed in detail with the patient. Please refer to After Visit Summary for other counseling recommendations.  Return in about 2 weeks (around 02/20/2017) for OB visit (MD).   Conan BowensKelly M Mikaia Janvier, MD

## 2017-02-06 NOTE — Addendum Note (Signed)
Addended by: Gerome ApleyZEYFANG, LINDA L on: 02/06/2017 02:53 PM   Modules accepted: Orders

## 2017-02-06 NOTE — Progress Notes (Signed)
Declines flu shot today, may get it later.

## 2017-02-11 NOTE — L&D Delivery Note (Signed)
Patient is 27 y.o. G1P1001 5654w5d admitted with PROM at [id number]. Prenatal course also complicated by GDM.  Delivery Note At 9:34 PM a viable female was delivered via Vaginal, Spontaneous (Presentation: ROA).  APGAR: 8, 9; weight 7 lb 4.2 oz (3295 g).   Placenta status: Intact.  Cord: 3V with the following complications: None.  Cord pH: N/A  Anesthesia: Local Episiotomy: None Lacerations: Bilateral Labial;Perineal;Vaginal Suture Repair: 3.0 vicryl rapide Est. Blood Loss (mL): 500; cytotec 800mg  rectally given  Mom to postpartum.  Baby to Couplet care / Skin to Skin.  Caryl AdaJazma Anetha Slagel, DO OB Fellow Center for [facility], [facility]

## 2017-02-25 ENCOUNTER — Ambulatory Visit (INDEPENDENT_AMBULATORY_CARE_PROVIDER_SITE_OTHER): Payer: Self-pay | Admitting: Family Medicine

## 2017-02-25 VITALS — BP 114/50 | HR 82 | Wt 234.6 lb

## 2017-02-25 DIAGNOSIS — O0930 Supervision of pregnancy with insufficient antenatal care, unspecified trimester: Secondary | ICD-10-CM

## 2017-02-25 DIAGNOSIS — Z3402 Encounter for supervision of normal first pregnancy, second trimester: Secondary | ICD-10-CM

## 2017-02-25 DIAGNOSIS — E559 Vitamin D deficiency, unspecified: Secondary | ICD-10-CM

## 2017-02-25 DIAGNOSIS — O0932 Supervision of pregnancy with insufficient antenatal care, second trimester: Secondary | ICD-10-CM

## 2017-02-25 DIAGNOSIS — O24410 Gestational diabetes mellitus in pregnancy, diet controlled: Secondary | ICD-10-CM

## 2017-02-25 LAB — POCT URINALYSIS DIP (DEVICE)
Bilirubin Urine: NEGATIVE
GLUCOSE, UA: NEGATIVE mg/dL
KETONES UR: NEGATIVE mg/dL
NITRITE: NEGATIVE
PROTEIN: NEGATIVE mg/dL
Specific Gravity, Urine: 1.025 (ref 1.005–1.030)
Urobilinogen, UA: 0.2 mg/dL (ref 0.0–1.0)
pH: 7 (ref 5.0–8.0)

## 2017-02-25 NOTE — Progress Notes (Signed)
   PRENATAL VISIT NOTE  Subjective:  Krystal Glass is a 27 y.o. G1P0000 at 7182w1d being seen today for ongoing prenatal care.  She is currently monitored for the following issues for this high-risk pregnancy and has Vitamin D deficiency; Late prenatal care; Encounter for prenatal care in second trimester of first pregnancy; and Gestational diabetes on their problem list.  Patient reports no complaints.  Contractions: Not present. Vag. Bleeding: None.  Movement: Present. Denies leaking of fluid.   CBGs: Fasting: 88-160 (only 3 above goal) 2-hr Postprandial: 101-146 (only 3 > 120)  The following portions of the patient's history were reviewed and updated as appropriate: allergies, current medications, past family history, past medical history, past social history, past surgical history and problem list. Problem list updated.  Objective:   Vitals:   02/25/17 0913  BP: (!) 114/50  Pulse: 82  Weight: 234 lb 9.6 oz (106.4 kg)    Fetal Status: Fetal Heart Rate (bpm): 164   Movement: Present     General:  Alert, oriented and cooperative. Patient is in no acute distress.  Skin: Skin is warm and dry. No rash noted.   Cardiovascular: Normal heart rate noted  Respiratory: Normal respiratory effort, no problems with respiration noted  Abdomen: Soft, gravid, appropriate for gestational age.  Pain/Pressure: Present     Pelvic: Cervical exam deferred        Extremities: Normal range of motion.  Edema: None  Mental Status:  Normal mood and affect. Normal behavior. Normal judgment and thought content.   Assessment and Plan:  Pregnancy: G1P0000 at 5382w1d  1. Encounter for prenatal care in second trimester of first pregnancy - Continue routine prenatal care, HOB - Start antenatal testing given GDM - F/u in 2 weeks  2.  Diet controlled gestational diabetes mellitus (GDM), antepartum Currently on metformin 500 mg BID (dose increased last visit CBGs improved, most at goal now - Continue metformin  500 mg BID - Growth scan scheduled for 02/28/17 - Start antenatal testing, will schedule for same day as U/S later this week - F/u in 2 weeks  3. Late prenatal care  Preterm labor symptoms and general obstetric precautions including but not limited to vaginal bleeding, contractions, leaking of fluid and fetal movement were reviewed in detail with the patient. Please refer to After Visit Summary for other counseling recommendations.  Return in about 2 weeks (around 03/11/2017).   Raynelle FanningJulie P. Onyinyechi Huante, MD OB Fellow   Future Appointments  Date Time Provider Department Center  02/28/2017  8:00 AM WH-MFC US 3 WH-MFCUS MFC-US  02/28/2017 11:15 AM WOC-WOCA NST WOC-WOCA WOC  03/10/2017  9:55 AM Conan Bowensavis, Kelly M, MD WOC-WOCA WOC  03/24/2017  8:55 AM Conan Bowensavis, Kelly M, MD WOC-WOCA WOC  03/31/2017  8:55 AM Conan Bowensavis, Kelly M, MD WOC-WOCA WOC  04/07/2017  8:55 AM Adam PhenixArnold, James G, MD [facility]

## 2017-02-25 NOTE — Patient Instructions (Addendum)
Places to have your son circumcised:    [facility] [phone] while you are in hospital  [facility] [phone] $244 by 4 wks  [facility] [phone] $175 by 2 wks  [facility] [phone] $250 by 7 days [facility] [phone] $150 by 4 wks  These prices sometimes change but are roughly what you can expect to pay. Please call and confirm pricing.   Circumcision is considered an elective/non-medically necessary procedure. There are many reasons parents decide to have their sons circumsized. During the first year of life circumcised males have a reduced risk of urinary tract infections but after this year the rates between circumcised males and uncircumcised males are the same.  It is safe to have your son circumcised outside of the hospital and the places above perform them regularly.   Deciding about Circumcision in Baby Boys  (Up-to-date The Basics)  What is circumcision?  Circumcision is a surgery that removes the skin that covers the tip of the penis, called the "foreskin" Circumcision is usually done when a boy is between 42 and 36 days old. In the [place], circumcision is common. In some other countries, fewer boys are circumcised. Circumcision is a common tradition in some religions.  Should I have my baby boy circumcised?  There is no easy answer. Circumcision has some benefits. But it also has risks. After talking with your doctor, you will have to decide for yourself what is right for your family.  What are the benefits of circumcision?  Circumcised boys seem to have slightly lower rates of: ?Urinary tract infections ?Swelling of the opening at the tip of the penis Circumcised men seem to have slightly lower rates of: ?Urinary tract infections ?Swelling of the opening at the tip of the penis ?Penis  cancer ?HIV and other infections that you catch during sex ?Cervical cancer in the women they have sex with Even so, in the [place], the risks of these problems are small - even in boys and men who have not been circumcised. Plus, boys and men who are not circumcised can reduce these extra risks by: ?Cleaning their penis well ?Using condoms during sex  What are the risks of circumcision?  Risks include: ?Bleeding or infection from the surgery ?Damage to or amputation of the penis ?A chance that the doctor will cut off too much or not enough of the foreskin ?A chance that sex won't feel as good later in life Only about 1 out of every 200 circumcisions leads to problems. There is also a chance that your health insurance won't pay for circumcision.  How is circumcision done in baby boys?  First, the baby gets medicine for pain relief. This might be a cream on the skin or a shot into the base of the penis. Next, the doctor cleans the baby's penis well. Then he or she uses special tools to cut off the foreskin. Finally, the doctor wraps a bandage (called gauze) around the baby's penis. If you have your baby circumcised, his doctor or nurse will give you instructions on how to care for him after the surgery. It is important that you follow those instructions carefully.   AREA PEDIATRIC/FAMILY PRACTICE PHYSICIANS  [facility] [address] [place], [place]  [address] Phone - [phone]   Fax - [phone]  [facility] [address] [place], [place] [address] Phone - [phone]   Fax - [phone]  [facility] AMOS [address] [place],  [place]  [address] Phone - [phone]   Fax - [phone]  [facility] [address] [place], [place]  [address] Phone - [phone]   Fax - [phone]  [facility] [address] [place], [place]  [address] Phone - [phone]   Fax -  [phone]  [facility] [address], [address] [place], [place]  [address] Phone - [phone]   Fax - [phone]  [facility] [address] [place], [place]  [address] Phone - [phone]   Fax - [phone]  [facility] [address], [address] [place], [place]  [address] Phone - [phone]   Fax - [phone]  [facility] [address] [place], [place]  [address] Phone - [phone]   Fax - [phone] [facility] [address] [place], [place]  [address] Phone - [phone]   Fax - [phone]  [facility] [address] [place], [place]  [address] Phone - [phone]   Fax - [phone]  [facility] [address], [address] [place], [place]  [address] Phone - [phone]   Fax - [phone]  [facility] [address] [place], [place]  [address] Phone - [phone]   Fax - [phone]  [facility] [address], [address] [place], [place]  [address] Phone - [phone]  [facility] [address] [place], [place]  [address] Phone - [phone]   Fax - [phone]  [facility] [address], [address] [place], [place]  [address] Phone - [phone]   Fax - [phone]  [facility] [address] [place], [place]  [address] Phone - [phone]   Fax - [phone]  [facility] [address] [place], [place]  [address] Phone - [phone]   Fax - [phone]   [facility] [address], [address] [place], [place]  [address] Phone - [phone]   Fax - [phone]  [facility] [address], [address] [place], [place]  [address] Phone - [phone]   Fax - [phone]  DAVID RUBIN [address], [address] [place], [place]  [address] Phone - [phone]   Fax - [phone]  [facility] [address] [place], [place]  [address] Phone - [phone]   Fax - [phone]  [facility] [address] [place], [place]  [address] Phone - [phone]   Fax - [phone] [facility] [place] [address] [place], [place]  [address] Phone - [phone]   Fax - [phone]  [facility] [address] [place], [place]  [address] Phone - [phone]   Fax - [phone]  [facility] [place] [address] [place], [place]  [address] Phone - [phone]   Fax - [phone]  [facility]  Wyvonne Lenzharlene Flemming MD [address] [place] [address] Phone [phone]  Fax [phone]

## 2017-02-27 ENCOUNTER — Other Ambulatory Visit: Payer: Self-pay | Admitting: *Deleted

## 2017-02-27 DIAGNOSIS — O24415 Gestational diabetes mellitus in pregnancy, controlled by oral hypoglycemic drugs: Secondary | ICD-10-CM

## 2017-02-27 NOTE — Progress Notes (Signed)
BPP added to US for growth on 1/18.

## 2017-02-28 ENCOUNTER — Other Ambulatory Visit: Payer: Self-pay | Admitting: Obstetrics & Gynecology

## 2017-02-28 ENCOUNTER — Other Ambulatory Visit (HOSPITAL_COMMUNITY): Payer: Self-pay | Admitting: Maternal and Fetal Medicine

## 2017-02-28 ENCOUNTER — Other Ambulatory Visit (HOSPITAL_COMMUNITY): Payer: Self-pay | Admitting: *Deleted

## 2017-02-28 ENCOUNTER — Encounter (HOSPITAL_COMMUNITY): Payer: Self-pay

## 2017-02-28 ENCOUNTER — Ambulatory Visit (HOSPITAL_COMMUNITY)
Admission: RE | Admit: 2017-02-28 | Discharge: 2017-02-28 | Disposition: A | Discharge status: Home / Self Care | Payer: Self-pay | Source: Ambulatory Visit | Attending: Family Medicine | Admitting: Family Medicine

## 2017-02-28 ENCOUNTER — Ambulatory Visit: Payer: Self-pay | Admitting: *Deleted

## 2017-02-28 VITALS — BP 101/50 | HR 83

## 2017-02-28 DIAGNOSIS — O99213 Obesity complicating pregnancy, third trimester: Secondary | ICD-10-CM | POA: Insufficient documentation

## 2017-02-28 DIAGNOSIS — O24415 Gestational diabetes mellitus in pregnancy, controlled by oral hypoglycemic drugs: Secondary | ICD-10-CM

## 2017-02-28 DIAGNOSIS — Z362 Encounter for other antenatal screening follow-up: Secondary | ICD-10-CM

## 2017-02-28 DIAGNOSIS — Z363 Encounter for antenatal screening for malformations: Secondary | ICD-10-CM

## 2017-02-28 DIAGNOSIS — Z3A32 32 weeks gestation of pregnancy: Secondary | ICD-10-CM

## 2017-02-28 DIAGNOSIS — O99210 Obesity complicating pregnancy, unspecified trimester: Secondary | ICD-10-CM

## 2017-02-28 DIAGNOSIS — E669 Obesity, unspecified: Secondary | ICD-10-CM | POA: Insufficient documentation

## 2017-02-28 NOTE — Progress Notes (Addendum)
US for growth and BPP done today  NST reviewed and reactive.  Carolyn L. Harraway-Smith, M.D., Evern CoreFACOG

## 2017-03-06 ENCOUNTER — Ambulatory Visit (INDEPENDENT_AMBULATORY_CARE_PROVIDER_SITE_OTHER): Payer: Self-pay | Admitting: *Deleted

## 2017-03-06 ENCOUNTER — Ambulatory Visit (INDEPENDENT_AMBULATORY_CARE_PROVIDER_SITE_OTHER): Payer: Self-pay | Admitting: Obstetrics and Gynecology

## 2017-03-06 ENCOUNTER — Encounter: Payer: Self-pay | Admitting: Obstetrics and Gynecology

## 2017-03-06 ENCOUNTER — Encounter: Payer: Self-pay | Admitting: Obstetrics & Gynecology

## 2017-03-06 ENCOUNTER — Ambulatory Visit: Payer: Self-pay

## 2017-03-06 VITALS — BP 102/64 | HR 93 | Wt 235.6 lb

## 2017-03-06 DIAGNOSIS — Z3402 Encounter for supervision of normal first pregnancy, second trimester: Secondary | ICD-10-CM

## 2017-03-06 DIAGNOSIS — O24415 Gestational diabetes mellitus in pregnancy, controlled by oral hypoglycemic drugs: Secondary | ICD-10-CM

## 2017-03-06 LAB — POCT URINALYSIS DIP (DEVICE)
Bilirubin Urine: NEGATIVE
GLUCOSE, UA: NEGATIVE mg/dL
Hgb urine dipstick: NEGATIVE
Ketones, ur: 40 mg/dL — AB
Nitrite: NEGATIVE
PH: 6 (ref 5.0–8.0)
PROTEIN: NEGATIVE mg/dL
Specific Gravity, Urine: >=1.03 (ref 1.005–1.030)
UROBILINOGEN UA: 0.2 mg/dL (ref 0.0–1.0)

## 2017-03-06 NOTE — Progress Notes (Signed)

## 2017-03-06 NOTE — Progress Notes (Signed)
Pt reports 2 episodes of clear, watery fluid leaking 5 days ago and then again the next day - none since.  US for growth and BPP done on 1/18 - next scheduled on 2/15.

## 2017-03-06 NOTE — Progress Notes (Signed)
   PRENATAL VISIT NOTE  Subjective:  Krystal Glass is a 27 y.o. G1P0000 at 4653w3d being seen today for ongoing prenatal care.  She is currently monitored for the following issues for this high-risk pregnancy and has Vitamin D deficiency; Late prenatal care; Encounter for prenatal care in second trimester of first pregnancy; and Gestational diabetes on their problem list.  Patient reports no complaints.  Contractions: Irregular. Vag. Bleeding: None.  Movement: Present. Denies leaking of fluid.   The following portions of the patient's history were reviewed and updated as appropriate: allergies, current medications, past family history, past medical history, past social history, past surgical history and problem list. Problem list updated.  Objective:   Vitals:   03/06/17 1512  BP: 102/64  Pulse: 93  Weight: 235 lb 9.6 oz (106.9 kg)    Fetal Status: Fetal Heart Rate (bpm): NST   Movement: Present     General:  Alert, oriented and cooperative. Patient is in no acute distress.  Skin: Skin is warm and dry. No rash noted.   Cardiovascular: Normal heart rate noted  Respiratory: Normal respiratory effort, no problems with respiration noted  Abdomen: Soft, gravid, appropriate for gestational age.  Pain/Pressure: Present     Pelvic: Cervical exam deferred        Extremities: Normal range of motion.  Edema: None  Mental Status:  Normal mood and affect. Normal behavior. Normal judgment and thought content.   Assessment and Plan:  Pregnancy: G1P0000 at 6753w3d  1. Encounter for prenatal care in second trimester of first pregnancy Patient is doing well without complaints  2. Gestational diabetes mellitus (GDM) in third trimester controlled on oral hypoglycemic drug CBGs reviewed and all within range with the exception of 2 after lunch 130 and 132 Continue metformin 500 BID Follow up growth ultrasound already scheduled Continue antenatal testing NST reviewed and reactive  Preterm labor  symptoms and general obstetric precautions including but not limited to vaginal bleeding, contractions, leaking of fluid and fetal movement were reviewed in detail with the patient. Please refer to After Visit Summary for other counseling recommendations.  Return in about 8 days (around 03/14/2017) for as scheduled.   Catalina AntiguaPeggy Jackston [place], MD

## 2017-03-07 ENCOUNTER — Other Ambulatory Visit: Payer: Self-pay

## 2017-03-07 ENCOUNTER — Other Ambulatory Visit (HOSPITAL_COMMUNITY): Payer: Self-pay

## 2017-03-10 ENCOUNTER — Encounter: Payer: Self-pay | Admitting: Obstetrics and Gynecology

## 2017-03-14 ENCOUNTER — Ambulatory Visit: Payer: Self-pay

## 2017-03-14 ENCOUNTER — Ambulatory Visit (INDEPENDENT_AMBULATORY_CARE_PROVIDER_SITE_OTHER): Payer: Self-pay | Admitting: *Deleted

## 2017-03-14 ENCOUNTER — Ambulatory Visit (INDEPENDENT_AMBULATORY_CARE_PROVIDER_SITE_OTHER): Payer: Self-pay | Admitting: Obstetrics and Gynecology

## 2017-03-14 ENCOUNTER — Other Ambulatory Visit (HOSPITAL_COMMUNITY): Payer: Self-pay

## 2017-03-14 ENCOUNTER — Encounter: Payer: Self-pay | Admitting: Obstetrics and Gynecology

## 2017-03-14 VITALS — BP 108/54 | HR 82 | Wt 238.3 lb

## 2017-03-14 DIAGNOSIS — O24415 Gestational diabetes mellitus in pregnancy, controlled by oral hypoglycemic drugs: Secondary | ICD-10-CM

## 2017-03-14 DIAGNOSIS — Z3402 Encounter for supervision of normal first pregnancy, second trimester: Secondary | ICD-10-CM

## 2017-03-14 NOTE — Progress Notes (Signed)
   PRENATAL VISIT NOTE  Subjective:  Krystal Glass is a 27 y.o. G1P0000 at 3548w4d being seen today for ongoing prenatal care.  She is currently monitored for the following issues for this high-risk pregnancy and has Vitamin D deficiency; Late prenatal care; Encounter for prenatal care in second trimester of first pregnancy; and Gestational diabetes on their problem list.  Patient reports occasional contractions.  Contractions: Irregular. Vag. Bleeding: None.  Movement: Present. Denies leaking of fluid.   GDMA2, metformin 500 mg BID FG: <95 PP: mostly under 120, occasional 140  The following portions of the patient's history were reviewed and updated as appropriate: allergies, current medications, past family history, past medical history, past social history, past surgical history and problem list. Problem list updated.  Objective:   Vitals:   03/14/17 0827  BP: (!) 108/54  Pulse: 82  Weight: 238 lb 4.8 oz (108.1 kg)    Fetal Status: Fetal Heart Rate (bpm): NST   Movement: Present     General:  Alert, oriented and cooperative. Patient is in no acute distress.  Skin: Skin is warm and dry. No rash noted.   Cardiovascular: Normal heart rate noted  Respiratory: Normal respiratory effort, no problems with respiration noted  Abdomen: Soft, gravid, appropriate for gestational age.  Pain/Pressure: Present     Pelvic: Cervical exam deferred        Extremities: Normal range of motion.  Edema: None  Mental Status:  Normal mood and affect. Normal behavior. Normal judgment and thought content.   Assessment and Plan:  Pregnancy: G1P0000 at 3348w4d  1. Gestational diabetes mellitus (GDM) in third trimester controlled on oral hypoglycemic drug Well controlled on metformin 500 mg BID Cont current regimen - US MFM FETAL BPP WO NON STRESS; Future BPP today NST reactive today  2. Encounter for prenatal care in second trimester of first pregnancy    Preterm labor symptoms and general  obstetric precautions including but not limited to vaginal bleeding, contractions, leaking of fluid and fetal movement were reviewed in detail with the patient. Please refer to After Visit Summary for other counseling recommendations.  Return in about 7 days (around 03/21/2017) for as scheduled.   Conan BowensKelly M Bion Todorov, MD

## 2017-03-14 NOTE — Progress Notes (Signed)
Pt informed that the ultrasound is considered a limited OB ultrasound and is not intended to be a complete ultrasound exam.  Patient also informed that the ultrasound is not being completed with the intent of assessing for fetal or placental anomalies or any pelvic abnormalities.  Explained that the purpose of today's ultrasound is to assess for presentation, BPP and amnitoic fluid volume.  Patient acknowledges the purpose of the exam and the limitations of the study.

## 2017-03-21 ENCOUNTER — Ambulatory Visit: Payer: Self-pay

## 2017-03-21 ENCOUNTER — Ambulatory Visit (INDEPENDENT_AMBULATORY_CARE_PROVIDER_SITE_OTHER): Payer: Self-pay | Admitting: *Deleted

## 2017-03-21 ENCOUNTER — Ambulatory Visit (INDEPENDENT_AMBULATORY_CARE_PROVIDER_SITE_OTHER): Payer: Self-pay | Admitting: Obstetrics & Gynecology

## 2017-03-21 ENCOUNTER — Other Ambulatory Visit (HOSPITAL_COMMUNITY): Payer: Self-pay

## 2017-03-21 VITALS — BP 102/54 | HR 81 | Wt 241.4 lb

## 2017-03-21 DIAGNOSIS — O0993 Supervision of high risk pregnancy, unspecified, third trimester: Secondary | ICD-10-CM

## 2017-03-21 DIAGNOSIS — O0990 Supervision of high risk pregnancy, unspecified, unspecified trimester: Secondary | ICD-10-CM

## 2017-03-21 DIAGNOSIS — O24415 Gestational diabetes mellitus in pregnancy, controlled by oral hypoglycemic drugs: Secondary | ICD-10-CM

## 2017-03-21 LAB — POCT URINALYSIS DIP (DEVICE)
Bilirubin Urine: NEGATIVE
GLUCOSE, UA: NEGATIVE mg/dL
Hgb urine dipstick: NEGATIVE
Ketones, ur: NEGATIVE mg/dL
Nitrite: NEGATIVE
PROTEIN: NEGATIVE mg/dL
SPECIFIC GRAVITY, URINE: 1.015 (ref 1.005–1.030)
Urobilinogen, UA: 0.2 mg/dL (ref 0.0–1.0)
pH: 7 (ref 5.0–8.0)

## 2017-03-21 NOTE — Patient Instructions (Signed)
Labor Induction Labor induction is when steps are taken to cause a pregnant woman to begin the labor process. Most women go into labor on their own between 37 weeks and 42 weeks of the pregnancy. When this does not happen or when there is a medical need, methods may be used to induce labor. Labor induction causes a pregnant woman's uterus to contract. It also causes the cervix to soften (ripen), open (dilate), and thin out (efface). Usually, labor is not induced before 39 weeks of the pregnancy unless there is a problem with the baby or mother. Before inducing labor, your health care provider will consider a number of factors, including the following:  The medical condition of you and the baby.  How many weeks along you are.  The status of the baby's lung maturity.  The condition of the cervix.  The position of the baby. What are the reasons for labor induction? Labor may be induced for the following reasons:  The health of the baby or mother is at risk.  The pregnancy is overdue by 1 week or more.  The water breaks but labor does not start on its own.  The mother has a health condition or serious illness, such as high blood pressure, infection, placental abruption, or diabetes.  The amniotic fluid amounts are low around the baby.  The baby is distressed. Convenience or wanting the baby to be born on a certain date is not a reason for inducing labor. What methods are used for labor induction? Several methods of labor induction may be used, such as:  Prostaglandin medicine. This medicine causes the cervix to dilate and ripen. The medicine will also start contractions. It can be taken by mouth or by inserting a suppository into the vagina.  Inserting a thin tube (catheter) with a balloon on the end into the vagina to dilate the cervix. Once inserted, the balloon is expanded with water, which causes the cervix to open.  Stripping the membranes. Your health care provider separates  amniotic sac tissue from the cervix, causing the cervix to be stretched and causing the release of a hormone called progesterone. This may cause the uterus to contract. It is often done during an office visit. You will be sent home to wait for the contractions to begin. You will then come in for an induction.  Breaking the water. Your health care provider makes a hole in the amniotic sac using a small instrument. Once the amniotic sac breaks, contractions should begin. This may still take hours to see an effect.  Medicine to trigger or strengthen contractions. This medicine is given through an IV access tube inserted into a vein in your arm. All of the methods of induction, besides stripping the membranes, will be done in the hospital. Induction is done in the hospital so that you and the baby can be carefully monitored. How long does it take for labor to be induced? Some inductions can take up to 2-3 days. Depending on the cervix, it usually takes less time. It takes longer when you are induced early in the pregnancy or if this is your first pregnancy. If a mother is still pregnant and the induction has been going on for 2-3 days, either the mother will be sent home or a cesarean delivery will be needed. What are the risks associated with labor induction? Some of the risks of induction include:  Changes in fetal heart rate, such as too high, too low, or erratic.  Fetal distress.    Chance of infection for the mother and baby.  Increased chance of having a cesarean delivery.  Breaking off (abruption) of the placenta from the uterus (rare).  Uterine rupture (very rare). When induction is needed for medical reasons, the benefits of induction may outweigh the risks. What are some reasons for not inducing labor? Labor induction should not be done if:  It is shown that your baby does not tolerate labor.  You have had previous surgeries on your uterus, such as a myomectomy or the removal of  fibroids.  Your placenta lies very low in the uterus and blocks the opening of the cervix (placenta previa).  Your baby is not in a head-down position.  The umbilical cord drops down into the birth canal in front of the baby. This could cut off the baby's blood and oxygen supply.  You have had a previous cesarean delivery.  There are unusual circumstances, such as the baby being extremely premature. This information is not intended to replace advice given to you by your health care provider. Make sure you discuss any questions you have with your health care provider. Document Released: 06/19/2006 Document Revised: 07/06/2015 Document Reviewed: 08/27/2012 Elsevier Interactive Patient Education  2017 Elsevier Inc.  

## 2017-03-21 NOTE — Progress Notes (Signed)
   PRENATAL VISIT NOTE  Subjective:  Krystal Glass is a 27 y.o. G1P0000 at 119w4d being seen today for ongoing prenatal care.  She is currently monitored for the following issues for this high-risk pregnancy and has Vitamin D deficiency; Late prenatal care; Encounter for prenatal care in second trimester of first pregnancy; and Gestational diabetes on their problem list.  Patient reports occasional contractions.  Contractions: Irregular. Vag. Bleeding: None.  Movement: Present. Denies leaking of fluid.   The following portions of the patient's history were reviewed and updated as appropriate: allergies, current medications, past family history, past medical history, past social history, past surgical history and problem list. Problem list updated.  Objective:   Vitals:   03/21/17 0824  BP: (!) 102/54  Pulse: 81  Weight: 241 lb 6.4 oz (109.5 kg)    Fetal Status: Fetal Heart Rate (bpm): NST   Movement: Present     General:  Alert, oriented and cooperative. Patient is in no acute distress.  Skin: Skin is warm and dry. No rash noted.   Cardiovascular: Normal heart rate noted  Respiratory: Normal respiratory effort, no problems with respiration noted  Abdomen: Soft, gravid, appropriate for gestational age.  Pain/Pressure: Present     Pelvic: Cervical exam deferred        Extremities: Normal range of motion.  Edema: Trace  Mental Status:  Normal mood and affect. Normal behavior. Normal judgment and thought content.   Assessment and Plan:  Pregnancy: G1P0000 at 5719w4d  1. Supervision of high risk pregnancy, antepartum NST reactive,   2. Gestational diabetes mellitus (GDM) in third trimester controlled on oral hypoglycemic drug FBS normal and PP  Preterm labor symptoms and general obstetric precautions including but not limited to vaginal bleeding, contractions, leaking of fluid and fetal movement were reviewed in detail with the patient. Please refer to After Visit Summary for other  counseling recommendations.  Return in about 7 days (around 03/28/2017) for as scheduled; 2/22 NST/BPP and HOB.   Scheryl DarterJames Estreya Clay, MD

## 2017-03-21 NOTE — Progress Notes (Signed)

## 2017-03-21 NOTE — Progress Notes (Signed)
Pt has concerns about induction of labor. US for growth and BPP scheduled on 2/15.

## 2017-03-24 ENCOUNTER — Encounter: Payer: Self-pay | Admitting: Obstetrics and Gynecology

## 2017-03-28 ENCOUNTER — Ambulatory Visit (INDEPENDENT_AMBULATORY_CARE_PROVIDER_SITE_OTHER): Payer: Self-pay | Admitting: *Deleted

## 2017-03-28 ENCOUNTER — Encounter: Payer: Self-pay | Admitting: Obstetrics and Gynecology

## 2017-03-28 ENCOUNTER — Ambulatory Visit (HOSPITAL_COMMUNITY)
Admission: RE | Admit: 2017-03-28 | Discharge: 2017-03-28 | Disposition: A | Discharge status: Home / Self Care | Payer: Self-pay | Source: Ambulatory Visit | Attending: Family Medicine | Admitting: Family Medicine

## 2017-03-28 ENCOUNTER — Encounter (HOSPITAL_COMMUNITY): Payer: Self-pay

## 2017-03-28 ENCOUNTER — Other Ambulatory Visit: Payer: Self-pay | Admitting: Obstetrics and Gynecology

## 2017-03-28 ENCOUNTER — Ambulatory Visit (INDEPENDENT_AMBULATORY_CARE_PROVIDER_SITE_OTHER): Payer: Self-pay | Admitting: Obstetrics and Gynecology

## 2017-03-28 ENCOUNTER — Other Ambulatory Visit (HOSPITAL_COMMUNITY): Payer: Self-pay | Admitting: Obstetrics and Gynecology

## 2017-03-28 VITALS — BP 101/56 | HR 81 | Wt 242.2 lb

## 2017-03-28 DIAGNOSIS — O0993 Supervision of high risk pregnancy, unspecified, third trimester: Secondary | ICD-10-CM

## 2017-03-28 DIAGNOSIS — O24415 Gestational diabetes mellitus in pregnancy, controlled by oral hypoglycemic drugs: Secondary | ICD-10-CM

## 2017-03-28 DIAGNOSIS — E669 Obesity, unspecified: Secondary | ICD-10-CM

## 2017-03-28 DIAGNOSIS — O99213 Obesity complicating pregnancy, third trimester: Secondary | ICD-10-CM

## 2017-03-28 DIAGNOSIS — Z113 Encounter for screening for infections with a predominantly sexual mode of transmission: Secondary | ICD-10-CM

## 2017-03-28 DIAGNOSIS — Z3A36 36 weeks gestation of pregnancy: Secondary | ICD-10-CM | POA: Insufficient documentation

## 2017-03-28 DIAGNOSIS — O0990 Supervision of high risk pregnancy, unspecified, unspecified trimester: Secondary | ICD-10-CM

## 2017-03-28 DIAGNOSIS — O99210 Obesity complicating pregnancy, unspecified trimester: Secondary | ICD-10-CM | POA: Insufficient documentation

## 2017-03-28 LAB — POCT URINALYSIS DIP (DEVICE)
Bilirubin Urine: NEGATIVE
Glucose, UA: NEGATIVE mg/dL
KETONES UR: NEGATIVE mg/dL
Nitrite: NEGATIVE
PH: 6 (ref 5.0–8.0)
PROTEIN: NEGATIVE mg/dL
Specific Gravity, Urine: 1.025 (ref 1.005–1.030)
Urobilinogen, UA: 1 mg/dL (ref 0.0–1.0)

## 2017-03-28 NOTE — Progress Notes (Signed)
US for growth and BPP done today 

## 2017-03-28 NOTE — Progress Notes (Signed)
Prenatal Visit Note Date: 03/28/2017 [facility]  Subjective:  Krystal Glass is a 27 y.o. G1P0000 at 489w4d being seen today for ongoing prenatal care.  She is currently monitored for the following issues for this high-risk pregnancy and has Vitamin D deficiency; Late prenatal care; Supervision of high risk pregnancy, antepartum, third trimester; Gestational diabetes; Obesity (BMI 30-39.9); and Obesity in pregnancy on their problem list.  Patient reports no complaints.   Contractions: Irregular. Vag. Bleeding: None.  Movement: Present. Denies leaking of fluid.   The following portions of the patient's history were reviewed and updated as appropriate: allergies, current medications, past family history, past medical history, past social history, past surgical history and problem list. Problem list updated.  Objective:   Vitals:   03/28/17 0919  BP: (!) 101/56  Pulse: 81  Weight: 242 lb 3.2 oz (109.9 kg)    Fetal Status: Fetal Heart Rate (bpm): NST   Movement: Present     General:  Alert, oriented and cooperative. Patient is in no acute distress.  Skin: Skin is warm and dry. No rash noted.   Cardiovascular: Normal heart rate noted  Respiratory: Normal respiratory effort, no problems with respiration noted  Abdomen: Soft, gravid, appropriate for gestational age. Pain/Pressure: Present     Pelvic:  Cervical exam deferred        Extremities: Normal range of motion.     Mental Status: Normal mood and affect. Normal behavior. Normal judgment and thought content.   Urinalysis:      Assessment and Plan:  Pregnancy: G1P0000 at 609w4d  1. Supervision of high risk pregnancy, antepartum Routine cre. D/w pt re: BC nv - GC[facility]Chlamydia probe amp [facility] - Strep Gp B NAA  2. Gestational diabetes mellitus (GDM) in third trimester controlled on oral hypoglycemic drug Doing well on metformin 500 bid with breakfast and dinner. bpp 10/10, ceph, efw 36%,  ac 35% today. Continue with qwk testing  3. Supervision of high risk pregnancy, antepartum, third trimester  4. Obesity in pregnancy  5. Obesity (BMI 30-39.9)  Preterm labor symptoms and general obstetric precautions including but not limited to vaginal bleeding, contractions, leaking of fluid and fetal movement were reviewed in detail with the patient. Please refer to After Visit Summary for other counseling recommendations.  Return in about 7 days (around 04/04/2017) for as scheduled.   [place] Bing[place], Kimari Coudriet, MD

## 2017-03-30 LAB — STREP GP B NAA: Strep Gp B NAA: NEGATIVE

## 2017-03-31 ENCOUNTER — Encounter: Payer: Self-pay | Admitting: Obstetrics and Gynecology

## 2017-03-31 LAB — GC/CHLAMYDIA PROBE AMP (~~LOC~~) NOT AT ARMC
Chlamydia: NEGATIVE
Neisseria Gonorrhea: NEGATIVE

## 2017-04-04 ENCOUNTER — Ambulatory Visit (INDEPENDENT_AMBULATORY_CARE_PROVIDER_SITE_OTHER): Payer: Self-pay | Admitting: *Deleted

## 2017-04-04 ENCOUNTER — Ambulatory Visit: Payer: Self-pay

## 2017-04-04 ENCOUNTER — Other Ambulatory Visit (HOSPITAL_COMMUNITY): Payer: Self-pay

## 2017-04-04 ENCOUNTER — Ambulatory Visit (INDEPENDENT_AMBULATORY_CARE_PROVIDER_SITE_OTHER): Payer: Self-pay | Admitting: Obstetrics & Gynecology

## 2017-04-04 VITALS — BP 107/58 | HR 78 | Wt 242.4 lb

## 2017-04-04 DIAGNOSIS — O0990 Supervision of high risk pregnancy, unspecified, unspecified trimester: Secondary | ICD-10-CM

## 2017-04-04 DIAGNOSIS — O24415 Gestational diabetes mellitus in pregnancy, controlled by oral hypoglycemic drugs: Secondary | ICD-10-CM

## 2017-04-04 DIAGNOSIS — O0993 Supervision of high risk pregnancy, unspecified, third trimester: Secondary | ICD-10-CM

## 2017-04-04 MED ORDER — METFORMIN HCL 500 MG PO TABS: 500.0000 mg | ORAL_TABLET | Freq: Two times a day (BID) | ORAL | 1 refills | Status: DC

## 2017-04-04 NOTE — Patient Instructions (Signed)
Labor Induction Labor induction is when steps are taken to cause a pregnant woman to begin the labor process. Most women go into labor on their own between 37 weeks and 42 weeks of the pregnancy. When this does not happen or when there is a medical need, methods may be used to induce labor. Labor induction causes a pregnant woman's uterus to contract. It also causes the cervix to soften (ripen), open (dilate), and thin out (efface). Usually, labor is not induced before 39 weeks of the pregnancy unless there is a problem with the baby or mother. Before inducing labor, your health care provider will consider a number of factors, including the following:  The medical condition of you and the baby.  How many weeks along you are.  The status of the baby's lung maturity.  The condition of the cervix.  The position of the baby. What are the reasons for labor induction? Labor may be induced for the following reasons:  The health of the baby or mother is at risk.  The pregnancy is overdue by 1 week or more.  The water breaks but labor does not start on its own.  The mother has a health condition or serious illness, such as high blood pressure, infection, placental abruption, or diabetes.  The amniotic fluid amounts are low around the baby.  The baby is distressed. Convenience or wanting the baby to be born on a certain date is not a reason for inducing labor. What methods are used for labor induction? Several methods of labor induction may be used, such as:  Prostaglandin medicine. This medicine causes the cervix to dilate and ripen. The medicine will also start contractions. It can be taken by mouth or by inserting a suppository into the vagina.  Inserting a thin tube (catheter) with a balloon on the end into the vagina to dilate the cervix. Once inserted, the balloon is expanded with water, which causes the cervix to open.  Stripping the membranes. Your health care provider separates  amniotic sac tissue from the cervix, causing the cervix to be stretched and causing the release of a hormone called progesterone. This may cause the uterus to contract. It is often done during an office visit. You will be sent home to wait for the contractions to begin. You will then come in for an induction.  Breaking the water. Your health care provider makes a hole in the amniotic sac using a small instrument. Once the amniotic sac breaks, contractions should begin. This may still take hours to see an effect.  Medicine to trigger or strengthen contractions. This medicine is given through an IV access tube inserted into a vein in your arm. All of the methods of induction, besides stripping the membranes, will be done in the hospital. Induction is done in the hospital so that you and the baby can be carefully monitored. How long does it take for labor to be induced? Some inductions can take up to 2-3 days. Depending on the cervix, it usually takes less time. It takes longer when you are induced early in the pregnancy or if this is your first pregnancy. If a mother is still pregnant and the induction has been going on for 2-3 days, either the mother will be sent home or a cesarean delivery will be needed. What are the risks associated with labor induction? Some of the risks of induction include:  Changes in fetal heart rate, such as too high, too low, or erratic.  Fetal distress.    Chance of infection for the mother and baby.  Increased chance of having a cesarean delivery.  Breaking off (abruption) of the placenta from the uterus (rare).  Uterine rupture (very rare). When induction is needed for medical reasons, the benefits of induction may outweigh the risks. What are some reasons for not inducing labor? Labor induction should not be done if:  It is shown that your baby does not tolerate labor.  You have had previous surgeries on your uterus, such as a myomectomy or the removal of  fibroids.  Your placenta lies very low in the uterus and blocks the opening of the cervix (placenta previa).  Your baby is not in a head-down position.  The umbilical cord drops down into the birth canal in front of the baby. This could cut off the baby's blood and oxygen supply.  You have had a previous cesarean delivery.  There are unusual circumstances, such as the baby being extremely premature. This information is not intended to replace advice given to you by your health care provider. Make sure you discuss any questions you have with your health care provider. Document Released: 06/19/2006 Document Revised: 07/06/2015 Document Reviewed: 08/27/2012 Elsevier Interactive Patient Education  2017 Elsevier Inc.  

## 2017-04-04 NOTE — Progress Notes (Signed)

## 2017-04-04 NOTE — Progress Notes (Signed)
   PRENATAL VISIT NOTE  Subjective:  Krystal Glass is a 27 y.o. G1P0000 at 10547w4d being seen today for ongoing prenatal care.  She is currently monitored for the following issues for this high-risk pregnancy and has Vitamin D deficiency; Late prenatal care; Supervision of high risk pregnancy, antepartum, third trimester; Gestational diabetes; Obesity (BMI 30-39.9); and Obesity in pregnancy on their problem list.  Patient reports no complaints.  Contractions: Irregular. Vag. Bleeding: None.  Movement: Present. Denies leaking of fluid.   The following portions of the patient's history were reviewed and updated as appropriate: allergies, current medications, past family history, past medical history, past social history, past surgical history and problem list. Problem list updated.  Objective:   Vitals:   04/04/17 0929  BP: (!) 107/58  Pulse: 78  Weight: 242 lb 6.4 oz (110 kg)    Fetal Status: Fetal Heart Rate (bpm): NST   Movement: Present     General:  Alert, oriented and cooperative. Patient is in no acute distress.  Skin: Skin is warm and dry. No rash noted.   Cardiovascular: Normal heart rate noted  Respiratory: Normal respiratory effort, no problems with respiration noted  Abdomen: Soft, gravid, appropriate for gestational age.  Pain/Pressure: Present     Pelvic: Cervical exam deferred        Extremities: Normal range of motion.     Mental Status:  Normal mood and affect. Normal behavior. Normal judgment and thought content.   Assessment and Plan:  Pregnancy: G1P0000 at 9847w4d  1. Supervision of high risk pregnancy, antepartum IOL 39 weeks  2. Gestational diabetes mellitus (GDM) in third trimester controlled on oral hypoglycemic drug BG control; is good  - metFORMIN (GLUCOPHAGE) 500 MG tablet; Take 1 tablet (500 mg total) by mouth 2 (two) times daily with a meal.  Dispense: 60 tablet; Refill: 1  Term labor symptoms and general obstetric precautions including but not limited  to vaginal bleeding, contractions, leaking of fluid and fetal movement were reviewed in detail with the patient. Please refer to After Visit Summary for other counseling recommendations.  Return in about 1 week (around 04/11/2017) for NST/BPP and HOB.   Scheryl DarterJames Baylea Milburn, MD

## 2017-04-07 ENCOUNTER — Telehealth (HOSPITAL_COMMUNITY): Payer: Self-pay | Admitting: *Deleted

## 2017-04-07 ENCOUNTER — Encounter: Payer: Self-pay | Admitting: Obstetrics & Gynecology

## 2017-04-07 ENCOUNTER — Encounter: Payer: Self-pay | Admitting: *Deleted

## 2017-04-07 NOTE — Telephone Encounter (Signed)
Preadmission screen  

## 2017-04-08 ENCOUNTER — Other Ambulatory Visit: Payer: Self-pay | Admitting: Advanced Practice Midwife

## 2017-04-11 ENCOUNTER — Ambulatory Visit: Payer: Self-pay

## 2017-04-11 ENCOUNTER — Ambulatory Visit (INDEPENDENT_AMBULATORY_CARE_PROVIDER_SITE_OTHER): Payer: Self-pay | Admitting: *Deleted

## 2017-04-11 ENCOUNTER — Encounter: Payer: Self-pay | Admitting: Obstetrics and Gynecology

## 2017-04-11 ENCOUNTER — Ambulatory Visit (INDEPENDENT_AMBULATORY_CARE_PROVIDER_SITE_OTHER): Payer: Self-pay | Admitting: Obstetrics and Gynecology

## 2017-04-11 VITALS — BP 104/60 | HR 75 | Wt 243.8 lb

## 2017-04-11 DIAGNOSIS — O0993 Supervision of high risk pregnancy, unspecified, third trimester: Secondary | ICD-10-CM

## 2017-04-11 DIAGNOSIS — O24415 Gestational diabetes mellitus in pregnancy, controlled by oral hypoglycemic drugs: Secondary | ICD-10-CM

## 2017-04-11 DIAGNOSIS — E669 Obesity, unspecified: Secondary | ICD-10-CM

## 2017-04-11 NOTE — Progress Notes (Signed)

## 2017-04-11 NOTE — Progress Notes (Signed)
   PRENATAL VISIT NOTE  Subjective:  Krystal Glass is a 27 y.o. G1P0000 at 2441w4d being seen today for ongoing prenatal care.  She is currently monitored for the following issues for this high-risk pregnancy and has Vitamin D deficiency; Late prenatal care; Supervision of high risk pregnancy, antepartum, third trimester; Gestational diabetes; Obesity (BMI 30-39.9); and Obesity in pregnancy on their problem list.  Patient reports decreased movement yesterday.  Contractions: Irregular. Vag. Bleeding: Scant.  Movement: (!) Decreased. Denies leaking of fluid.   The following portions of the patient's history were reviewed and updated as appropriate: allergies, current medications, past family history, past medical history, past social history, past surgical history and problem list. Problem list updated.  Objective:   Vitals:   04/11/17 0845  BP: 104/60  Pulse: 75  Weight: 243 lb 12.8 oz (110.6 kg)    Fetal Status: Fetal Heart Rate (bpm): NST   Movement: (!) Decreased     General:  Alert, oriented and cooperative. Patient is in no acute distress.  Skin: Skin is warm and dry. No rash noted.   Cardiovascular: Normal heart rate noted  Respiratory: Normal respiratory effort, no problems with respiration noted  Abdomen: Soft, gravid, appropriate for gestational age.  Pain/Pressure: Present     Pelvic: Cervical exam deferred        Extremities: Normal range of motion.     Mental Status:  Normal mood and affect. Normal behavior. Normal judgment and thought content.   Assessment and Plan:  Pregnancy: G1P0000 at 3441w4d  1. Supervision of high risk pregnancy, antepartum, third trimester IOL scheduled for 39 weeks  2. Gestational diabetes mellitus (GDM) in third trimester controlled on oral hypoglycemic drug Metformin 500 mg BID BG well controlled Cont current regimen BPP today 10/10  3. Obesity (BMI 30-39.9)   Preterm labor symptoms and general obstetric precautions including but not  limited to vaginal bleeding, contractions, leaking of fluid and fetal movement were reviewed in detail with the patient. Please refer to After Visit Summary for other counseling recommendations.  Return in about 5 weeks (around 05/16/2017) for PP visit.  IOL on 3/4.   Conan BowensKelly M Ettamae Barkett, MD

## 2017-04-11 NOTE — Progress Notes (Signed)
I have reviewed this chart and agree with the RN/CMA assessment and management.    K. Meryl Marcy Sookdeo, M.D. Attending Obstetrician & Gynecologist, [facility], [facility]  

## 2017-04-11 NOTE — Progress Notes (Signed)
Pt reports decreased FM x2 days. She is scheduled for IOL on 3/4.

## 2017-04-12 ENCOUNTER — Other Ambulatory Visit: Payer: Self-pay

## 2017-04-12 ENCOUNTER — Inpatient Hospital Stay (HOSPITAL_COMMUNITY)
Admission: AD | Admit: 2017-04-12 | Discharge: 2017-04-14 | DRG: 807 | Disposition: A | Discharge status: Home / Self Care | Payer: Medicaid Other | Source: Ambulatory Visit | Attending: Obstetrics and Gynecology | Admitting: Obstetrics and Gynecology

## 2017-04-12 ENCOUNTER — Encounter (HOSPITAL_COMMUNITY): Payer: Self-pay

## 2017-04-12 DIAGNOSIS — O99210 Obesity complicating pregnancy, unspecified trimester: Secondary | ICD-10-CM | POA: Diagnosis present

## 2017-04-12 DIAGNOSIS — E669 Obesity, unspecified: Secondary | ICD-10-CM | POA: Diagnosis present

## 2017-04-12 DIAGNOSIS — Z3A38 38 weeks gestation of pregnancy: Secondary | ICD-10-CM | POA: Diagnosis not present

## 2017-04-12 DIAGNOSIS — O24425 Gestational diabetes mellitus in childbirth, controlled by oral hypoglycemic drugs: Secondary | ICD-10-CM | POA: Diagnosis present

## 2017-04-12 DIAGNOSIS — O99214 Obesity complicating childbirth: Secondary | ICD-10-CM | POA: Diagnosis present

## 2017-04-12 DIAGNOSIS — O24419 Gestational diabetes mellitus in pregnancy, unspecified control: Secondary | ICD-10-CM | POA: Diagnosis present

## 2017-04-12 DIAGNOSIS — O0993 Supervision of high risk pregnancy, unspecified, third trimester: Secondary | ICD-10-CM

## 2017-04-12 DIAGNOSIS — O4292 Full-term premature rupture of membranes, unspecified as to length of time between rupture and onset of labor: Secondary | ICD-10-CM | POA: Diagnosis present

## 2017-04-12 DIAGNOSIS — O0930 Supervision of pregnancy with insufficient antenatal care, unspecified trimester: Secondary | ICD-10-CM

## 2017-04-12 DIAGNOSIS — O4202 Full-term premature rupture of membranes, onset of labor within 24 hours of rupture: Secondary | ICD-10-CM | POA: Diagnosis not present

## 2017-04-12 LAB — GLUCOSE, CAPILLARY
GLUCOSE-CAPILLARY: 110 mg/dL — AB (ref 65–99)
Glucose-Capillary: 119 mg/dL — ABNORMAL HIGH (ref 65–99)
Glucose-Capillary: 93 mg/dL (ref 65–99)

## 2017-04-12 LAB — CBC
HEMATOCRIT: 38.2 % (ref 36.0–46.0)
Hemoglobin: 12.6 g/dL (ref 12.0–15.0)
MCH: 28.9 pg (ref 26.0–34.0)
MCHC: 33 g/dL (ref 30.0–36.0)
MCV: 87.6 fL (ref 78.0–100.0)
PLATELETS: 266 10*3/uL (ref 150–400)
RBC: 4.36 MIL/uL (ref 3.87–5.11)
RDW: 14.4 % (ref 11.5–15.5)
WBC: 8.5 10*3/uL (ref 4.0–10.5)

## 2017-04-12 LAB — POCT FERN TEST
POCT FERN TEST: POSITIVE
POCT FERN TEST: POSITIVE

## 2017-04-12 LAB — TYPE AND SCREEN
ABO/RH(D): O POS
ANTIBODY SCREEN: NEGATIVE

## 2017-04-12 LAB — ABO/RH: ABO/RH(D): O POS

## 2017-04-12 MED ORDER — MISOPROSTOL 200 MCG PO TABS
ORAL_TABLET | ORAL | Status: AC
  Filled 2017-04-12: qty 4

## 2017-04-12 MED ORDER — SOD CITRATE-CITRIC ACID 500-334 MG/5ML PO SOLN: 30.0000 mL | ORAL | Status: DC | PRN

## 2017-04-12 MED ORDER — LACTATED RINGERS IV SOLN
INTRAVENOUS | Status: DC
  Administered 2017-04-12 (×2): via INTRAVENOUS

## 2017-04-12 MED ORDER — LACTATED RINGERS IV SOLN: 500.0000 mL | INTRAVENOUS | Status: DC | PRN

## 2017-04-12 MED ORDER — ONDANSETRON HCL 4 MG/2ML IJ SOLN: 4.0000 mg | Freq: Four times a day (QID) | INTRAMUSCULAR | Status: DC | PRN

## 2017-04-12 MED ORDER — MISOPROSTOL 200 MCG PO TABS
800.0000 ug | ORAL_TABLET | Freq: Once | ORAL | Status: AC
  Administered 2017-04-12: 800 ug via RECTAL

## 2017-04-12 MED ORDER — OXYTOCIN 40 UNITS IN LACTATED RINGERS INFUSION - SIMPLE MED
2.5000 [IU]/h | INTRAVENOUS | Status: DC
  Filled 2017-04-12: qty 1000

## 2017-04-12 MED ORDER — ACETAMINOPHEN 325 MG PO TABS: 650.0000 mg | ORAL_TABLET | ORAL | Status: DC | PRN

## 2017-04-12 MED ORDER — LIDOCAINE HCL (PF) 1 % IJ SOLN
30.0000 mL | INTRAMUSCULAR | Status: DC | PRN
  Administered 2017-04-12: 30 mL via SUBCUTANEOUS
  Filled 2017-04-12: qty 30

## 2017-04-12 MED ORDER — TERBUTALINE SULFATE 1 MG/ML IJ SOLN
0.2500 mg | Freq: Once | INTRAMUSCULAR | Status: DC | PRN
  Filled 2017-04-12: qty 1

## 2017-04-12 MED ORDER — OXYTOCIN BOLUS FROM INFUSION
500.0000 mL | Freq: Once | INTRAVENOUS | Status: AC
  Administered 2017-04-12: 500 mL via INTRAVENOUS

## 2017-04-12 NOTE — MAU Note (Signed)
Patient presents with rupture of membranes around 6:30 am today, mild irregular contractions.  Patient is gestational diabetic taking Metformin.

## 2017-04-12 NOTE — Progress Notes (Addendum)
Krystal Glass Sawtelle is a 27 y.o. G1P0000 at 7445w5d by LMP admitted for PROM  Subjective:  Pt states contractions are staring to become more painful  Objective: BP 112/62   Pulse 76   Temp 98.4 F (36.9 C) (Oral)   Resp 18   Ht 5\' 4"  (1.626 m)   Wt 109.3 kg (241 lb)   LMP 07/09/2016 (Within Days)   BMI 41.37 kg/m  No intake/output data recorded. No intake/output data recorded.  FHT:  FHR: 150 bpm, variability: moderate,  accelerations:  Present,  decelerations:  Absent UC:   regular, every 3-4 minutes SVE:   Dilation: 3.5 Effacement (%): 100 Station: 0 Exam by:: Swiyyah CNM   EFW on 03/28/17 2640  gm    5 lb 13 oz      36  %  Labs: Lab Results  Component Value Date   WBC 8.5 04/12/2017   HGB 12.6 04/12/2017   HCT 38.2 04/12/2017   MCV 87.6 04/12/2017   PLT 266 04/12/2017    Assessment / Plan: [id number]@G1P0000@ 4345w5d  PROM  GBS negative GDM  Labor: early labor- will do nipple stimulation and continue expectant management Preeclampsia:  n/a Fetal Wellbeing:  Category I Pain Control:  Labor support without medications I/D:  n/a Anticipated MOD:  NSVD  Swiyyah O Harrington 04/12/2017, 3:28 PM   CNM attestation:  I have seen and examined this patient; I agree with above documentation in the midwife student's note.   Krystal Glass Krystal Glass is a 27 y.o. G1P0000 admitted for PROM   PE: BP 112/62   Pulse 76   Temp 98.4 F (36.9 C) (Oral)   Resp 18   Ht 5\' 4"  (1.626 m)   Wt 241 lb (109.3 kg)   LMP 07/09/2016 (Within Days)   BMI 41.37 kg/m  Gen: calm comfortable, NAD Resp: normal effort, no distress Abd: gravid  ROS, labs, PMH reviewed Category I FHR tracing FHT:  FHR: 150 bpm, variability: moderate,  accelerations:  Present,  decelerations:  Absent UC:   regular, every 3-4 minutes  Plan: Pt with intermittent, irregular contractions, not very painful, slightly increased in intensity since PROM 10+ hours ago. Cervix 3/100/0 but no prior exam to compare Discussed  augmentation with pt, cytotec PO vs Pitocin vs nipple stimulation.  Discussed benefits/risks/alternatives to each.  Pt selects to start with nipple stimulation,10 minutes with breast pump followed by 30 minutes of rest, then resume until contractions intensify.  Will reevaluate in 2 hours   Sharen CounterLisa Leftwich-Kirby, CNM 4:24 PM

## 2017-04-12 NOTE — H&P (Addendum)
Krystal Glass is a 27 y.o. femaleG1P0000 @ [redacted]w[redacted]d presenting for PROM.   OB History    Gravida Para Term Preterm AB Living   1 0 0 0 0 0   SAB TAB Ectopic Multiple Live Births   0 0 0 0 0     Past Medical History:  Diagnosis Date  . 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 Closed displaced fracture of left clavicle 10/12/2015  . Closed displaced segmental fracture of shaft of left humerus 10/09/2015  . Gestational diabetes   . PCOS (polycystic ovarian syndrome)    Past Surgical History:  Procedure Laterality Date  . ORIF HUMERUS FRACTURE Left 10/10/2015   Procedure: OPEN REDUCTION INTERNAL FIXATION (ORIF) LEFT HUMERUS AND CLAVICLE FRACTURE;  Surgeon: Myrene Galas, MD;  Location: [facility] OR;  Service: Orthopedics;  Laterality: Left;   Clinic  [facility] Prenatal Labs  Dating  LMP (07/09/2016) Blood type: O/Positive/-- (10/04 1411)   Genetic Screen  1 Screen:    AFP:     Quad: WNL     NIPS: Antibody:Negative (10/04 1411)  Anatomic [place]  Normal ([facility]) Rubella: Immune  GTT Early: positive         Third trimester: N/A RPR: Non Reactive (10/04 1411)   Flu vaccine 02/06/17 HBsAg: Negative (10/04 1411)   TDaP vaccine                01/22/17                    Rhogam: N/A HIV: Non-reactive  Baby Food  Breast                                          GBS: neg   (For PCN allergy, check sensitivities)  Contraception  OCP Pap: 11/21/2016  Circumcision  Yes   Pediatrician  Durward Parcel, DO   Support Person  Father (husband)   Prenatal Classes  Yes    Family History: family history is not on file. Social History:  reports that  has never smoked. she has never used smokeless tobacco. She reports that she does not drink alcohol or use drugs.     Maternal Diabetes: Yes:  Diabetes Type:  Insulin/Medication controlled Genetic Screening: Normal Maternal Ultrasounds/Referrals: Normal Fetal Ultrasounds or other Referrals:  None Maternal Substance Abuse:  No Significant Maternal  Medications:  None Significant Maternal Lab Results:  None Other Comments:  None  Review of Systems  Constitutional: Negative.  Negative for chills and fever.  HENT: Negative.   Eyes: Negative.  Negative for blurred vision.  Respiratory: Negative.  Negative for cough and shortness of breath.   Cardiovascular: Negative.  Negative for chest pain.  Gastrointestinal: Negative.  Negative for abdominal pain, nausea and vomiting.  Genitourinary: Negative.   Musculoskeletal: Negative.   Skin: Negative.   Neurological: Negative.  Negative for dizziness and headaches.  Endo/Heme/Allergies: Negative.   Psychiatric/Behavioral: Negative.    Maternal Medical History:  Reason for admission: Rupture of membranes.  Nausea. ROM @ 0630  Contractions: Onset was 1-2 hours ago.   Frequency: irregular.   Perceived severity is mild.    Fetal activity: Perceived fetal activity is normal.   Last perceived fetal movement was within the past hour.    Prenatal complications: no prenatal complications Prenatal Complications - Diabetes: gestational. Metformin 500mg     Blood pressure 127/71,  pulse 77, temperature 98.2 F (36.8 C), temperature source Oral, resp. rate 18, height 5\' 4"  (1.626 m), weight 109.3 kg (241 lb), last menstrual period 07/09/2016. Maternal Exam:  Uterine Assessment: Contraction strength is mild.  Contraction frequency is irregular.   Abdomen: Patient reports no abdominal tenderness. Fundal height is s=d.   Fetal presentation: vertex  Introitus: Normal vulva. Ferning test: not done.  Nitrazine test: not done. Amniotic fluid character: clear.  Cervix: not evaluated.   Physical Exam  Vitals reviewed. Constitutional: She is oriented to person, place, and time. She appears well-developed and well-nourished.  HENT:  Head: Normocephalic and atraumatic.  Eyes: Conjunctivae and EOM are normal. Pupils are equal, round, and reactive to light.  Neck: Normal range of motion. Neck  supple.  Cardiovascular: Normal rate and regular rhythm.  Respiratory: Effort normal.  GI: Soft.  Musculoskeletal: Normal range of motion.  Neurological: She is alert and oriented to person, place, and time. She has normal reflexes.  Skin: Skin is warm and dry.  Psychiatric: She has a normal mood and affect. Her behavior is normal. Judgment and thought content normal.    Prenatal labs: ABO, Rh: O/Positive/-- (10/04 1411) Antibody: Negative (10/04 1411) Rubella: 4.92 (10/04 1411) RPR: Non Reactive (12/12 1631)  HBsAg: Negative (10/04 1411)  HIV: Non Reactive (12/12 1631)  GBS: Negative (02/15 1106)   Assessment/Plan: G1P0000 @ 6476w5d admitted for PROM Grossly ruptured- clear fluids A2GDM-Metformin GBS negative Expectant managements Anticipate NSVD     Krystal Glass 04/12/2017, 10:50 AM  Midwife attestation: I have seen and examined this patient; I agree with above documentation in the midwife student's note.   Krystal Glass is a 27 y.o. G1P0000 here for painful irregular contractions and leakage of clear fluid starting around 6 am 04/12/17.  PE: BP 127/71 (BP Location: Right Arm)   Pulse 77   Temp 98.2 F (36.8 C) (Oral)   Resp 18   Ht 5\' 4"  (1.626 m)   Wt 241 lb (109.3 kg)   LMP 07/09/2016 (Within Days)   BMI 41.37 kg/m  Gen: calm comfortable, NAD Resp: normal effort, no distress Abd: gravid  ROS, labs, PMH reviewed  I personally evaluated lesions reported by NP in MAU on pt mons.  Appearance c/w folliculitis, no lesions suspicious for HSV, pt with no known hx HSV.  Pt had bikini wax 2-3 days ago and bumps appeared afterwards. No pain or itching at site.  Plan: Admit to LD Labor: Expectant management. Pt has doula, written birth plan. Birth plan reviewed and discussed with pt. May intermittently monitor, saline lock IV, use tub for labor pain management, eat lightly. Pt vertex by bedside US on admission.  Plan to check cervix in 3-4 hours or sooner if more  painful/frequent contractions.  Add Q 4 hour blood glucose checks for A2/BDM on Metformin FWB: Category I on initial FHR tracing ID: [id number]  Sharen CounterLisa Leftwich-Kirby, CNM  04/12/2017, 11:12 AM

## 2017-04-12 NOTE — Progress Notes (Addendum)
Stark Kleinatiana Nicolosi is a 27 y.o. G1P0000 at 6277w5d by LMP admitted for PROM  Subjective:   Objective: BP 108/67   Pulse 79   Temp 98.1 F (36.7 C) (Oral)   Resp 18   Ht 5\' 4"  (1.626 m)   Wt 109.3 kg (241 lb)   LMP 07/09/2016 (Within Days)   SpO2 100%   BMI 41.37 kg/m  No intake/output data recorded. No intake/output data recorded.  FHT:  FHR: 140 bpm, variability: moderate,  accelerations:  Present,  decelerations:  Present variable UC:   regular, every 2-3 minutes SVE:   Dilation: Lip/rim Effacement (%): 100 Station: Plus 1 Exam by:: Tia MaskerSwiyyah Harrington, CNM Student  Labs: Lab Results  Component Value Date   WBC 8.5 04/12/2017   HGB 12.6 04/12/2017   HCT 38.2 04/12/2017   MCV 87.6 04/12/2017   PLT 266 04/12/2017    Assessment / Plan: Spontaneous labor, progressing normally  Labor: Progressing normally Preeclampsia:  n/a Fetal Wellbeing:  Category I Pain Control:  Labor support without medications and Nitrous Oxide I/D:  GBS neg Anticipated MOD:  NSVD  Swiyyah O Harrington 04/12/2017, 7:06 PM   CNM attestation:  I have seen and examined this patient; I agree with above documentation in the midwife student's note.   Stark Kleinatiana Fennel is a 27 y.o. G1P0000 admitted for PROM with onset of labor within 24 hours  PE: BP (!) 144/69   Pulse 82   Temp 98.1 F (36.7 C) (Oral)   Resp 18   Ht 5\' 4"  (1.626 m)   Wt 241 lb (109.3 kg)   LMP 07/09/2016 (Within Days)   SpO2 100%   BMI 41.37 kg/m  Gen: calm comfortable, NAD Resp: normal effort, no distress Abd: gravid  ROS, labs, PMH reviewed FHT:  FHR: 140 bpm, variability: moderate,  accelerations:  Present,  decelerations:  Present variable UC:   regular, every 2-3 minutes  Plan: Pt with anterior lip on cervical exam feeling rectal pressure with contractions Pt desires nitrous oxide, RN to set up Doula and family at bedside for support Anticipate NSVD  Sharen CounterLisa Leftwich-Kirby, CNM 7:21 PM

## 2017-04-12 NOTE — MAU Note (Signed)
srom at 0635, woke her. Clear fluid, still coming. No bleeding, has started contracting.  Has not been checked in the office.

## 2017-04-12 NOTE — MAU Provider Note (Signed)
S: Krystal Glass is a 27 y.o. G1P0000 at 6037w5d  who presents to MAU today complaining of leaking of fluid since 0630 this morning. She denies vaginal bleeding. She endorses contractions. She reports normal fetal movement.   Denies history of HSV.   O: BP 112/65 (BP Location: Right Arm)   Pulse 86   Temp 97.6 F (36.4 C) (Oral)   Resp 16   Ht 5\' 4"  (1.626 m)   Wt 241 lb (109.3 kg)   LMP 07/09/2016 (Within Days)   BMI 41.37 kg/m  GENERAL: Well-developed, well-nourished female in no acute distress.  HEAD: Normocephalic, atraumatic.  CHEST: Normal effort of breathing, regular heart rate ABDOMEN: Soft, nontender, gravid PELVIC: Normal external female genitalia. Vagina is pink and rugated. Cervix with normal contour, no lesions. Normal discharge.  Positive pooling of clear fluid. Grouping of small lesions on mons -- HSV vs ingrown hairs from recent wax.  Cervical exam: deferred     Fetal Monitoring: Baseline: 150 Variability: moderate Accelerations: 15x15 Decelerations: none Contractions: none  Results for orders placed or performed during the hospital encounter of 04/12/17 (from the past 24 hour(s))  POCT fern test     Status: Abnormal   Collection Time: 04/12/17  8:35 AM  Result Value Ref Range   POCT Fern Test Positive = ruptured amniotic membanes   POCT fern test     Status: Abnormal   Collection Time: 04/12/17  8:35 AM  Result Value Ref Range   POCT Fern Test Positive = ruptured amniotic membanes      A: SIUP at 3837w5d  SROM  P: Admit to birthing suites Care turned over to [facility] Leftwich Kirby CNM  Judeth HornLawrence, Promyse Ardito, NP 04/12/2017 8:21 AM

## 2017-04-13 LAB — RPR: RPR: NONREACTIVE

## 2017-04-13 MED ORDER — DIBUCAINE 1 % RE OINT
1.0000 "application " | TOPICAL_OINTMENT | RECTAL | Status: DC | PRN
  Administered 2017-04-13: 1 via RECTAL
  Filled 2017-04-13: qty 28

## 2017-04-13 MED ORDER — DIPHENHYDRAMINE HCL 25 MG PO CAPS: 25.0000 mg | ORAL_CAPSULE | Freq: Four times a day (QID) | ORAL | Status: DC | PRN

## 2017-04-13 MED ORDER — SENNOSIDES-DOCUSATE SODIUM 8.6-50 MG PO TABS
2.0000 | ORAL_TABLET | ORAL | Status: DC
  Administered 2017-04-14: 2 via ORAL
  Filled 2017-04-13: qty 2

## 2017-04-13 MED ORDER — OXYCODONE HCL 5 MG PO TABS: 10.0000 mg | ORAL_TABLET | ORAL | Status: DC | PRN

## 2017-04-13 MED ORDER — OXYCODONE HCL 5 MG PO TABS: 5.0000 mg | ORAL_TABLET | ORAL | Status: DC | PRN

## 2017-04-13 MED ORDER — COCONUT OIL OIL
1.0000 "application " | TOPICAL_OIL | Status: DC | PRN
  Administered 2017-04-13: 1 via TOPICAL
  Filled 2017-04-13: qty 120

## 2017-04-13 MED ORDER — WITCH HAZEL-GLYCERIN EX PADS: 1.0000 "application " | MEDICATED_PAD | CUTANEOUS | Status: DC | PRN

## 2017-04-13 MED ORDER — PRENATAL MULTIVITAMIN CH
1.0000 | ORAL_TABLET | Freq: Every day | ORAL | Status: DC
  Administered 2017-04-13: 1 via ORAL
  Filled 2017-04-13: qty 1

## 2017-04-13 MED ORDER — ONDANSETRON HCL 4 MG/2ML IJ SOLN: 4.0000 mg | INTRAMUSCULAR | Status: DC | PRN

## 2017-04-13 MED ORDER — BENZOCAINE-MENTHOL 20-0.5 % EX AERO
1.0000 "application " | INHALATION_SPRAY | CUTANEOUS | Status: DC | PRN
  Administered 2017-04-13: 1 via TOPICAL
  Filled 2017-04-13: qty 56

## 2017-04-13 MED ORDER — ONDANSETRON HCL 4 MG PO TABS: 4.0000 mg | ORAL_TABLET | ORAL | Status: DC | PRN

## 2017-04-13 MED ORDER — IBUPROFEN 600 MG PO TABS
600.0000 mg | ORAL_TABLET | Freq: Four times a day (QID) | ORAL | Status: DC
  Administered 2017-04-13 – 2017-04-14 (×6): 600 mg via ORAL
  Filled 2017-04-13 (×5): qty 1

## 2017-04-13 MED ORDER — TETANUS-DIPHTH-ACELL PERTUSSIS 5-2.5-18.5 LF-MCG/0.5 IM SUSP: 0.5000 mL | Freq: Once | INTRAMUSCULAR | Status: DC

## 2017-04-13 MED ORDER — ACETAMINOPHEN 325 MG PO TABS: 650.0000 mg | ORAL_TABLET | ORAL | Status: DC | PRN

## 2017-04-13 MED ORDER — SIMETHICONE 80 MG PO CHEW: 80.0000 mg | CHEWABLE_TABLET | ORAL | Status: DC | PRN

## 2017-04-13 MED ORDER — ZOLPIDEM TARTRATE 5 MG PO TABS: 5.0000 mg | ORAL_TABLET | Freq: Every evening | ORAL | Status: DC | PRN

## 2017-04-13 NOTE — Progress Notes (Signed)
Post Partum Day 1 Subjective: no complaints, up ad lib, voiding and tolerating PO  Objective: Blood pressure (!) 109/59, pulse 87, temperature 98.2 F (36.8 C), temperature source Oral, resp. rate 18, height 5\' 4"  (1.626 m), weight 241 lb (109.3 kg), last menstrual period 07/09/2016, SpO2 98 %, unknown if currently breastfeeding.  Physical Exam:  General: alert, cooperative, appears stated age and no distress Lochia: appropriate Uterine Fundus: firm Incision: n/a DVT Evaluation: No evidence of DVT seen on physical exam.  Recent Labs    04/12/17 0940  HGB 12.6  HCT 38.2    Assessment/Plan: Plan for discharge tomorrow   LOS: 1 day   Krystal Glass 04/13/2017, 9:43 AM

## 2017-04-13 NOTE — Lactation Note (Signed)
This note was copied from a baby's chart. Lactation Consultation Note  Patient Name: Boy Stark Kleinatiana Koslow ZOXWR'UToday's Date: 04/13/2017 Reason for consult: Initial assessment;Primapara;1st time breastfeeding;Early term 8837-38.6wks  719 hours old early term female who is being exclusively BF by her mother; she's a P1. Mom was worried about her supply because her breasts didn't get any bigger during the pregnancy but the areola did and also got darker. Mom already has sore nipples, right nipple looked intact upon examination but left nipple has a deep positional stripe that was causing pain and discomfort to mom during prior feedings.  Per mom feedings at the breast are now more tolerable since she corrected the shallow latch with the help of her RN. She believes she got a deeper latch this time and heard baby swallowing. Offered BF assistance but mom verbalized that baby has already nursed but if she needed further assistance she'll call for help.  Taught mom how to hand express and were able to get 2 drops of colostrum, fed those back to baby with finger feeding. Parents also asked for assistance with burping baby, they're not sure how to do it. LC showed different positions to burp baby safely.  Encouraged mom to feed baby on cues at least 8-12 times/day STS and watch for swallows. Reviewed BF brochure, BF resources and feeding diary, Mom is aware of LC services and will call PRN.  Maternal Data Formula Feeding for Exclusion: No Has patient been taught Hand Expression?: Yes Does the patient have breastfeeding experience prior to this delivery?: No  Interventions Interventions: Breast feeding basics reviewed;Hand express;Breast compression;Breast massage;Expressed milk  Lactation Tools Discussed/Used WIC Program: Yes   Consult Status Consult Status: Follow-up Date: 04/14/17 Follow-up type: In-patient    Castor Gittleman Venetia ConstableS Sami Froh 04/13/2017, 4:38 PM

## 2017-04-14 ENCOUNTER — Inpatient Hospital Stay (HOSPITAL_COMMUNITY): Admission: RE | Admit: 2017-04-14 | Payer: Self-pay | Source: Ambulatory Visit

## 2017-04-14 DIAGNOSIS — Z3A38 38 weeks gestation of pregnancy: Secondary | ICD-10-CM

## 2017-04-14 DIAGNOSIS — O4202 Full-term premature rupture of membranes, onset of labor within 24 hours of rupture: Secondary | ICD-10-CM

## 2017-04-14 MED ORDER — IBUPROFEN 600 MG PO TABS: 600.0000 mg | ORAL_TABLET | Freq: Four times a day (QID) | ORAL | 0 refills | Status: DC

## 2017-04-14 NOTE — Discharge Instructions (Signed)
Postpartum Care After Vaginal Delivery °The period of time right after you deliver your newborn is called the postpartum period. °What kind of medical care will I receive? °· You may continue to receive fluids and medicines through an IV tube inserted into one of your veins. °· If an incision was made near your vagina (episiotomy) or if you had some vaginal tearing during delivery, cold compresses may be placed on your episiotomy or your tear. This helps to reduce pain and swelling. °· You may be given a squirt bottle to use when you go to the bathroom. You may use this until you are comfortable wiping as usual. To use the squirt bottle, follow these steps: °? Before you urinate, fill the squirt bottle with warm water. Do not use hot water. °? After you urinate, while you are sitting on the toilet, use the squirt bottle to rinse the area around your urethra and vaginal opening. This rinses away any urine and blood. °? You may do this instead of wiping. As you start healing, you may use the squirt bottle before wiping yourself. Make sure to wipe gently. °? Fill the squirt bottle with clean water every time you use the bathroom. °· You will be given sanitary pads to wear. °How can I expect to feel? °· You may not feel the need to urinate for several hours after delivery. °· You will have some soreness and pain in your abdomen and vagina. °· If you are breastfeeding, you may have uterine contractions every time you breastfeed for up to several weeks postpartum. Uterine contractions help your uterus return to its normal size. °· It is normal to have vaginal bleeding (lochia) after delivery. The amount and appearance of lochia is often similar to a menstrual period in the first week after delivery. It will gradually decrease over the next few weeks to a dry, yellow-brown discharge. For most women, lochia stops completely by 6-8 weeks after delivery. Vaginal bleeding can vary from woman to woman. °· Within the first few  days after delivery, you may have breast engorgement. This is when your breasts feel heavy, full, and uncomfortable. Your breasts may also throb and feel hard, tightly stretched, warm, and tender. After this occurs, you may have milk leaking from your breasts. Your health care provider can help you relieve discomfort due to breast engorgement. Breast engorgement should go away within a few days. °· You may feel more sad or worried than normal due to hormonal changes after delivery. These feelings should not last more than a few days. If these feelings do not go away after several days, speak with your health care provider. °How should I care for myself? °· Tell your health care provider if you have pain or discomfort. °· Drink enough water to keep your urine clear or pale yellow. °· Wash your hands thoroughly with soap and water for at least 20 seconds after changing your sanitary pads, after using the toilet, and before holding or feeding your baby. °· If you are not breastfeeding, avoid touching your breasts a lot. Doing this can make your breasts produce more milk. °· If you become weak or lightheaded, or you feel like you might faint, ask for help before: °? Getting out of bed. °? Showering. °· Change your sanitary pads frequently. Watch for any changes in your flow, such as a sudden increase in volume, a change in color, the passing of large blood clots. If you pass a blood clot from your vagina, save it   to show to your health care provider. Do not flush blood clots down the toilet without having your health care provider look at them. °· Make sure that all your vaccinations are up to date. This can help protect you and your baby from getting certain diseases. You may need to have immunizations done before you leave the hospital. °· If desired, talk with your health care provider about methods of family planning or birth control (contraception). °How can I start bonding with my baby? °Spending as much time as  possible with your baby is very important. During this time, you and your baby can get to know each other and develop a bond. Having your baby stay with you in your room (rooming in) can give you time to get to know your baby. Rooming in can also help you become comfortable caring for your baby. Breastfeeding can also help you bond with your baby. °How can I plan for returning home with my baby? °· Make sure that you have a car seat installed in your vehicle. °? Your car seat should be checked by a certified car seat installer to make sure that it is installed safely. °? Make sure that your baby fits into the car seat safely. °· Ask your health care provider any questions you have about caring for yourself or your baby. Make sure that you are able to contact your health care provider with any questions after leaving the hospital. °This information is not intended to replace advice given to you by your health care provider. Make sure you discuss any questions you have with your health care provider. °Document Released: 11/25/2006 Document Revised: 07/03/2015 Document Reviewed: 01/02/2015 °Elsevier Interactive Patient Education © 2018 Elsevier Inc. ° °

## 2017-04-14 NOTE — Lactation Note (Addendum)
This note was copied from a baby's chart. Lactation Consultation Note  Patient Name: Krystal Glass EAVWU'JToday's Date: 04/14/2017 Reason for consult: Follow-up assessment  This LC helped to change infant's positioning at the breast. Infant noted to swallow more once position was improved (swallows verified by cervical auscultation [L breast]). Positioning tips shared. Mom's nipple creased when infant released latch. Mom was very comfortable with feeding (besides initial tenderness). It appears that nipple creasing was from infant falling asleep & allowing nipple to move to a more anterior position in the mouth. Mom may be more proactive in unlatching infant from breast once he has fallen asleep.   Mom knows that b/c of her PCOS, she may not have a full milk supply. Mom understands that she has less ductal tissue in her R breast than on her L breast. Mom reports that she has always had "asymmetrical breasts" (which are not readily noticeable on visual exam).  Mom has a Programme researcher, broadcasting/film/videoLansinoh electric pump at home. Mom plans on beginning pumping once home to see if she can augment her milk supply.   Parents were given a personalized pictorial diaper sheet for expected output. Based on current output, parents understand that infant should be expected to have 2 voids & 1 more stool before 9:34pm.   Mom says that she participates in the [facility] and that she will have a weekly visit from an RN/IBCLC who will also do weekly weights.    Krystal Glass, [facility] Glass [facility] 04/14/2017, 10:47 AM

## 2017-04-14 NOTE — Discharge Summary (Signed)
OB Discharge Summary     Patient Name: Krystal Glass DOB: 1990-10-17 MRN: [id number]  Date of admission: 04/12/2017 Delivering MD: Pincus LargePHELPS, JAZMA Y   Date of discharge: 04/14/2017  Admitting diagnosis: 38WKS,WATER BROKE Intrauterine pregnancy: 7559w5d     Secondary diagnosis:  Active Problems:   Late prenatal care   Supervision of high risk pregnancy, antepartum, third trimester   Gestational diabetes   Obesity in pregnancy   SVD (spontaneous vaginal delivery)    Discharge diagnosis: Term Pregnancy Delivered and GDM A2                                                                                                Post partum procedures:none  Augmentation: none  Complications: None  Hospital course:  Onset of Labor With Vaginal Delivery     27 y.o. yo G1P1001 at 4859w5d was admitted in Latent Labor and PROM on 04/12/2017. Patient had an uncomplicated labor course as follows: nipple stimulation only used for augmentation Membrane Rupture Time/Date: 6:30 AM ,04/12/2017   Intrapartum Procedures: Episiotomy: None [1]                                         Lacerations:  Labial [10];Perineal [11];Vaginal [6]  Patient had a delivery of a Viable infant. 04/12/2017  Information for the patient's newborn:  Rivka SpringJones, Boy Tija [[id number]][[id number]]  Delivery Method: Vag-Spont    Pateint had an uncomplicated postpartum course.  She is ambulating, tolerating a regular diet, passing flatus, and urinating well. Patient is discharged home in stable condition on 04/14/17.   Physical exam  Vitals:   04/13/17 0605 04/13/17 1639 04/13/17 1756 04/14/17 0517  BP: (!) 109/59 115/65 118/73 (!) 100/55  Pulse: 87 71 91 65  Resp: 18 18 18 18   Temp: 98.2 F (36.8 C) 98.6 F (37 C) 98.3 F (36.8 C) 98.1 F (36.7 C)  TempSrc: Oral Oral Oral Oral  SpO2:      Weight:      Height:       General: alert, cooperative and no distress Lochia: appropriate Uterine Fundus: soft Incision: Healing well with no significant  drainage DVT Evaluation: No evidence of DVT seen on physical exam. Labs: Lab Results  Component Value Date   WBC 8.5 04/12/2017   HGB 12.6 04/12/2017   HCT 38.2 04/12/2017   MCV 87.6 04/12/2017   PLT 266 04/12/2017   CMP Latest Ref Rng & Units 10/12/2015  Glucose 65 - 99 mg/dL 295(M101(H)  BUN 6 - 20 mg/dL 5(L)  Creatinine 8.410.44 - 1.00 mg/dL 3.240.62  Sodium 401135 - 027145 mmol/L 137  Potassium 3.5 - 5.1 mmol/L 3.7  Chloride 101 - 111 mmol/L 101  CO2 22 - 32 mmol/L 28  Calcium 8.9 - 10.3 mg/dL 2.5(D8.7(L)  Total Protein 6.5 - 8.1 g/dL -  Total Bilirubin 0.3 - 1.2 mg/dL -  Alkaline Phos 38 - 664126 U/L -  AST 15 - 41 U/L -  ALT 14 - 54 U/L -  Discharge instruction: per After Visit Summary and "Baby and Me Booklet".  After visit meds:  Allergies as of 04/14/2017   No Known Allergies     Medication List    STOP taking these medications   metFORMIN 500 MG tablet Commonly known as:  GLUCOPHAGE     TAKE these medications   ibuprofen 600 MG tablet Commonly known as:  ADVIL,MOTRIN Take 1 tablet (600 mg total) by mouth every 6 (six) hours.   prenatal vitamin w/FE, FA 27-1 MG Tabs tablet Take 1 tablet by mouth daily.       Diet: routine diet  Activity: Advance as tolerated. Pelvic rest for 6 weeks.   Outpatient follow up: 4 weeks Message sent to scheduling pool Please schedule this patient for Postpartum visit in: 4 weeks with the following provider: Any provider  For C/S patients schedule nurse incision check in weeks 2 weeks: no  High risk pregnancy complicated by: GDM  Delivery mode: SVD  Anticipated Birth Control: OCPs  PP Procedures needed: 2 hour GTT  Schedule Integrated BH visit: no   Postpartum contraception: Progesterone only pills  Newborn Data: Live born female  Birth Weight: 7 lb 4.2 oz (3295 g) APGAR: 8, 9  Newborn Delivery   Birth date/time:  04/12/2017 21:34:00 Delivery type:  Vaginal, Spontaneous     Baby Feeding: Breast Disposition:home with  mother   04/14/2017 Frederik Pear, MD

## 2017-04-14 NOTE — Lactation Note (Addendum)
This note was copied from a baby's chart. Lactation Consultation Note  Patient Name: Krystal Glass [id number]'XToday's Date: 04/14/2017   Primip with PCOS, GDM. Mom reports that breasts feel heavier. On palpation, breasts noted not to be filling at this time and ductal tissue not noted throughout entirety of breast, especially on R side.  Mom reports hearing infant gulp at breast. Mom's nipples have small compression stripes bilaterally. Mom reports that her nipples are pointed when infant releases latch.   Mom has Mom has my # to call for assist w/next feeding. Krystal Glass, [facility] Glass [facility] 04/14/2017, 8:23 AM

## 2017-04-15 ENCOUNTER — Telehealth: Payer: Self-pay | Admitting: General Practice

## 2017-04-15 NOTE — Telephone Encounter (Signed)
Left message for patient to give our office a call back in regards to appt on 05/20/17.

## 2017-04-18 ENCOUNTER — Encounter: Payer: Self-pay | Admitting: Obstetrics and Gynecology

## 2017-04-18 ENCOUNTER — Other Ambulatory Visit: Payer: Self-pay

## 2017-05-20 ENCOUNTER — Encounter: Payer: Self-pay | Admitting: Certified Nurse Midwife

## 2017-05-20 ENCOUNTER — Ambulatory Visit (INDEPENDENT_AMBULATORY_CARE_PROVIDER_SITE_OTHER): Payer: Self-pay | Admitting: Certified Nurse Midwife

## 2017-05-20 ENCOUNTER — Other Ambulatory Visit: Payer: Self-pay

## 2017-05-20 DIAGNOSIS — Z30011 Encounter for initial prescription of contraceptive pills: Secondary | ICD-10-CM

## 2017-05-20 DIAGNOSIS — O24429 Gestational diabetes mellitus in childbirth, unspecified control: Secondary | ICD-10-CM

## 2017-05-20 LAB — POCT PREGNANCY, URINE: Preg Test, Ur: NEGATIVE

## 2017-05-20 MED ORDER — NORETHINDRONE 0.35 MG PO TABS: 1.0000 | ORAL_TABLET | Freq: Every day | ORAL | 4 refills | Status: DC

## 2017-05-20 NOTE — Progress Notes (Signed)
Subjective:     Krystal Glass is a 27 y.o. female who presents for a postpartum visit. She is 5 weeks postpartum following a spontaneous vaginal delivery. I have fully reviewed the prenatal and intrapartum course. Pregnancy was complicated by GDM- oral medication.  The delivery was at 38.5 gestational weeks. Outcome: spontaneous vaginal delivery. Anesthesia: local. Postpartum course has been unremarkable. Baby's course has been unremarkable. Baby is feeding by both breast and bottle - Lucien MonsGerber Good Start Gentle. Bleeding no bleeding. Bowel function is normal. Bladder function is normal. Patient is sexually active, no burning or pulling with intercourse. Contraception method is none. Postpartum depression screening: positive. Pt would like to see [facility] at a later date. Scheduled for 2 hour GTT today- patient did not fast, ate prior to arrival today. Will reschedule GTT for later date this week.   The following portions of the patient's history were reviewed and updated as appropriate: allergies, past family history, past medical history, past social history, past surgical history and problem list.  Review of Systems Pertinent items noted in HPI and remainder of comprehensive ROS otherwise negative.   Objective:    LMP 07/09/2016 (Within Days)   General:  alert, cooperative and no distress   Breasts:  inspection negative, no nipple discharge or bleeding, no masses or nodularity palpable  Lungs: clear to auscultation bilaterally  Heart:  regular rate and rhythm and S1, S2 normal  Abdomen: soft, non-tender; bowel sounds normal; no masses,  no organomegaly   Vulva:  not evaluated  Vagina: not evaluated  Cervix:  not evaluated  Corpus: not examined  Adnexa:  not evaluated  Rectal Exam: Not performed.        Assessment/Plan:   1. Postpartum care and examination  -Normal postpartum exam. Pap smear not done at today's visit- last Pap 11/2016 and was normal.   2. Gestational diabetes mellitus (GDM),  delivered -patient did not come fasting this morning- ate prior to arrival.  -GTT rescheduled for Friday, discussed need to come fasting and to not eat after MN.  - Glucose tolerance, 2 hours; Future  3. Encounter for initial prescription of contraceptive pills -Educated and discussed options of birth control, patient verbalizes wanting birth control pills. Educated on birth control pills and breastfeeding, with need to take birth control pills everyday at the same time in order to prevent pregnancy. Patient verbalizes understanding.  -Contraception: POPs  - Pregnancy, urine POC - norethindrone (MICRONOR,CAMILA,ERRIN) 0.35 MG tablet; Take 1 tablet (0.35 mg total) by mouth daily.  Dispense: 3 Package; Refill: 4  Follow up in: 3 days on Friday for GTT or as needed.

## 2017-05-23 ENCOUNTER — Other Ambulatory Visit: Payer: Self-pay

## 2017-05-23 DIAGNOSIS — O24429 Gestational diabetes mellitus in childbirth, unspecified control: Secondary | ICD-10-CM

## 2017-05-24 LAB — GLUCOSE TOLERANCE, 2 HOURS
Glucose, 2 hour: 104 mg/dL (ref 65–139)
Glucose, GTT - Fasting: 91 mg/dL (ref 65–99)

## 2019-02-12 NOTE — L&D Delivery Note (Signed)
Delivery Note Pt began to feel uncomfortable, so got into the shower. 13 minutes later, she felt an urge to push. At 10:40 PM a viable female was delivered via Vaginal, Spontaneous (Presentation:   Occiput Anterior) whilst in the shower.  Baby caught by [employer].  APGAR: 8, 9; weight 8# 14 oz.  After 10 minutes, pt and baby moved to bed, very little blood loss at this point.  The cord was clamped and cut. The IV was noted to be infiltrated, so d/c'd  The placenta separated spontaneously and delivered via CCT and maternal pushing effort.  It was inspected and appears to be intact with a 3 VC.   Anesthesia: None Episiotomy: None Lacerations: None Suture Repair:  Est. Blood Loss (mL): 100  Mom to postpartum.  Baby to Couplet care / Skin to Skin.  Krystal Glass 11/02/2019, 12:12 AM

## 2019-04-02 ENCOUNTER — Encounter: Payer: Self-pay | Admitting: *Deleted

## 2019-04-22 ENCOUNTER — Ambulatory Visit (INDEPENDENT_AMBULATORY_CARE_PROVIDER_SITE_OTHER): Payer: Self-pay | Admitting: *Deleted

## 2019-04-22 ENCOUNTER — Other Ambulatory Visit: Payer: Self-pay

## 2019-04-22 ENCOUNTER — Encounter: Payer: Self-pay | Admitting: *Deleted

## 2019-04-22 DIAGNOSIS — Z8632 Personal history of gestational diabetes: Secondary | ICD-10-CM | POA: Insufficient documentation

## 2019-04-22 DIAGNOSIS — O99210 Obesity complicating pregnancy, unspecified trimester: Secondary | ICD-10-CM

## 2019-04-22 DIAGNOSIS — E282 Polycystic ovarian syndrome: Secondary | ICD-10-CM | POA: Insufficient documentation

## 2019-04-22 DIAGNOSIS — Z3490 Encounter for supervision of normal pregnancy, unspecified, unspecified trimester: Secondary | ICD-10-CM

## 2019-04-22 DIAGNOSIS — Z8659 Personal history of other mental and behavioral disorders: Secondary | ICD-10-CM

## 2019-04-22 DIAGNOSIS — O0993 Supervision of high risk pregnancy, unspecified, third trimester: Secondary | ICD-10-CM | POA: Insufficient documentation

## 2019-04-22 NOTE — Addendum Note (Signed)
Addended by: Gerome Apley on: 04/22/2019 12:17 PM   Modules accepted: Orders

## 2019-04-22 NOTE — Patient Instructions (Signed)

## 2019-04-22 NOTE — Progress Notes (Signed)
0881 I called Krystal Glass for her telephone visit and left a message I was calling for her telephone visit and will call again in a a few minutes; please be available to answer phone.  Krystal Marines,RN  8:39 I called Krystal Glass for her telephone visit and left a message I was calling for her telephone visit and since I did not connect with you; the visit will need to be rescheduled. Please call our office to reschedule. I will try your contact numbers as well. I called Krystal Glass's contact number for Krystal Glass and call went thru [employer] and rang multiple times and then I left a message. For her to call us.   She then called office and was transferred to nurse.  Krystal Carthen,RN  I connected with  Krystal Glass on 04/22/19 at [phone]  by telephone and verified that I am speaking with the correct person using two identifiers.   I discussed the limitations, risks, security and privacy concerns of performing an evaluation and management service by telephone and the availability of in person appointments. I also discussed with the patient that there may be a patient responsible charge related to this service. The patient expressed understanding and agreed to proceed.  Krystal Glass confirms she had positive pregnancy test at [facility]. States she has irregular periods because of PCOS . LMP 11/24/19. States she has been doing pregnancy tests anytime she doesn't have a period. States first home positive pregnancy test was 03/12/19.  She thinks she conceived around Christmas and had a negative pregnancy test end of December.  She then went to [facility] and they did an [place] and said she was 9.5 weeks and EDD 11/07/19.I explained we will need those records and I will call and ask them to send to [place]. She voices understanding.   I explained I am completing her New OB Intake today. We discussed Her EDD and that it is undeterrmined until we get her records [facility] ;but appears she is about 11 weeks based on  what she tells me.  I reviewed her allergies, meds, OB History, Medical /Surgical history, and appropriate screenings. I informed her of [employer] services and offered her a referral due to her history of postpartum depression - screens negative today. She accepted referral. I explained registrars will schedule and contact her with appointment.  Patient and/or legal guardian verbally consented to [facility] services about presenting concerns and psychiatric consultation as appropriate.    I explained I will send her the [employer] app and app was sent to her while on phone.  I explained we will give her  a blood pressure cuff when she has her first in person visit since she is Adopt a Mom. We will  show her how to use it. Explained  then we will have her take her blood pressure weekly and enter into the app. I explained she will have some visits in office and some virtually. She already has Sports coach. I reviewed her new ob  appointment date/ time with her , our location and to wear mask, no visitors.  I explained she will have a pelvic exam, ob bloodwork, hemoglobin a1C, cbg ,pap- may be done last 01/2017, and  genetic testing if desired,- she is undecided aboutt a panorama. I informed her we will  Schedule an [place] at 19 weeks  At her new ob visiit once we have assigned EDD. We [facility] and she gave them permission to send her records to  [place]. She voices understanding.  Krystal Torpey,RN 04/22/2019  8:45 AM

## 2019-04-22 NOTE — Progress Notes (Signed)
Received records from [facility] confirming EDD of 11/07/19 based on [place] in their office. Scheduled anatomy US for 19 weeks. Will send neilah fulwider message to notify. Tangi Shroff,RN

## 2019-04-26 NOTE — Progress Notes (Signed)
Patient seen and assessed by nursing staff during this encounter. I have reviewed the chart and agree with the documentation and plan.  Vonzella Nipple, PA-C 04/26/2019 2:22 PM

## 2019-04-29 ENCOUNTER — Institutional Professional Consult (permissible substitution): Payer: Self-pay

## 2019-05-03 ENCOUNTER — Institutional Professional Consult (permissible substitution): Payer: Self-pay

## 2019-05-03 ENCOUNTER — Ambulatory Visit (INDEPENDENT_AMBULATORY_CARE_PROVIDER_SITE_OTHER): Payer: Self-pay | Admitting: Licensed Clinical Social Worker

## 2019-05-03 ENCOUNTER — Encounter: Payer: Self-pay | Admitting: *Deleted

## 2019-05-03 ENCOUNTER — Other Ambulatory Visit: Payer: Self-pay

## 2019-05-03 DIAGNOSIS — Z3A13 13 weeks gestation of pregnancy: Secondary | ICD-10-CM

## 2019-05-03 DIAGNOSIS — Z8659 Personal history of other mental and behavioral disorders: Secondary | ICD-10-CM

## 2019-05-03 DIAGNOSIS — O99891 Other specified diseases and conditions complicating pregnancy: Secondary | ICD-10-CM

## 2019-05-03 NOTE — BH Specialist Note (Addendum)
Integrated Behavioral Health Initial Visit  MRN: [id number] Name: Krystal Glass  Number of Integrated Behavioral Health Clinician visits:: 1 Session Start time: 10:00am  Session End time: 10:22pm Total time: 22 mins   Type of Service: Integrated Behavioral Health in person visit- Individual Interpretor:no  Interpretor Name and Language: none    Warm Hand Off Completed.       SUBJECTIVE: Krystal Glass is a 29 y.o. 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 Reduce symptoms of: irritability, depressed mood and feelings of guilt   2. Increase knowledge and/or ability of:   3. Demonstrate ability to: Ms. Montelongo prefers to self manage symptoms without medication   INTERVENTIONS: Interventions utilized: supportive counseling   Standardized Assessments completed: 04/22/2019  ASSESSMENT: Patient currently experiencing depression affecting pregnancy    Patient may benefit from integrated behavioral health   PLAN: 1. Follow up with behavioral health clinician on : three weeks  2. Behavioral recommendations: continue taking prenatal vitamins, mindfulness, encourage self care activities and positive affirmations  3. Referral(s): none  4. "From scale of 1-10, how likely are you to follow plan?":   Gwyndolyn Saxon, LCSW

## 2019-05-06 ENCOUNTER — Ambulatory Visit (INDEPENDENT_AMBULATORY_CARE_PROVIDER_SITE_OTHER): Payer: Self-pay | Admitting: Medical

## 2019-05-06 ENCOUNTER — Other Ambulatory Visit: Payer: Self-pay

## 2019-05-06 ENCOUNTER — Encounter: Payer: Self-pay | Admitting: Medical

## 2019-05-06 VITALS — BP 103/63 | HR 86 | Wt 250.9 lb

## 2019-05-06 DIAGNOSIS — Z8632 Personal history of gestational diabetes: Secondary | ICD-10-CM

## 2019-05-06 DIAGNOSIS — O99891 Other specified diseases and conditions complicating pregnancy: Secondary | ICD-10-CM

## 2019-05-06 DIAGNOSIS — O99210 Obesity complicating pregnancy, unspecified trimester: Secondary | ICD-10-CM

## 2019-05-06 DIAGNOSIS — Z113 Encounter for screening for infections with a predominantly sexual mode of transmission: Secondary | ICD-10-CM

## 2019-05-06 DIAGNOSIS — Z8659 Personal history of other mental and behavioral disorders: Secondary | ICD-10-CM

## 2019-05-06 DIAGNOSIS — Z3A13 13 weeks gestation of pregnancy: Secondary | ICD-10-CM

## 2019-05-06 DIAGNOSIS — Z23 Encounter for immunization: Secondary | ICD-10-CM

## 2019-05-06 DIAGNOSIS — Z3490 Encounter for supervision of normal pregnancy, unspecified, unspecified trimester: Secondary | ICD-10-CM

## 2019-05-06 LAB — POCT URINALYSIS DIP (DEVICE)
Bilirubin Urine: NEGATIVE
Glucose, UA: NEGATIVE mg/dL
Ketones, ur: NEGATIVE mg/dL
Nitrite: NEGATIVE
Protein, ur: NEGATIVE mg/dL
Specific Gravity, Urine: >=1.03 (ref 1.005–1.030)
Urobilinogen, UA: 1 mg/dL (ref 0.0–1.0)
pH: 6.5 (ref 5.0–8.0)

## 2019-05-06 NOTE — Progress Notes (Signed)
   PRENATAL VISIT NOTE  Subjective:  Courtland Coppa is a 29 y.o. G3P1011 at [redacted]w[redacted]d being seen today for her first prenatal visit for this pregnancy.  She is currently monitored for the following issues for this high-risk pregnancy and has Obesity in pregnancy; Supervision of low-risk pregnancy; History of postpartum depression, currently pregnant; History of gestational diabetes mellitus (GDM); and PCOS (polycystic ovarian syndrome) on their problem list.  Patient reports no complaints.  Contractions: Not present. Vag. Bleeding: None.  Movement: Absent. Denies leaking of fluid.   She is planning to breastfeed. Planning partner vasectomy for contraception.   The following portions of the patient's history were reviewed and updated as appropriate: allergies, current medications, past family history, past medical history, past social history, past surgical history and problem list.   Objective:   Vitals:   05/06/19 0851  BP: 103/63  Pulse: 86  Weight: 250 lb 14.4 oz (113.8 kg)    Fetal Status: Fetal Heart Rate (bpm): 156   Movement: Absent     General:  Alert, oriented and cooperative. Patient is in no acute distress.  Skin: Skin is warm and dry. No rash noted.   Cardiovascular: Normal heart rate and rhythm noted  Respiratory: Normal respiratory effort, no problems with respiration noted. Clear to auscultation.   Abdomen: Soft, gravid, appropriate for gestational age. Normal bowel sounds. Non-tender. Pain/Pressure: Absent     Pelvic: Cervical exam deferred       Pap not indicated today   Extremities: Normal range of motion.  Edema: None  Mental Status: Normal mood and affect. Normal behavior. Normal judgment and thought content.   Assessment and Plan:  Pregnancy: G3P1011 at [redacted]w[redacted]d 1. Encounter for supervision of low-risk pregnancy, antepartum - Culture, OB Urine - Flu Vaccine QUAD 36+ mos IM - GC/Chlamydia probe amp ([place])not at [facility] - Genetic Screening - Obstetric Panel,  Including HIV - CHL AMB BABYSCRIPTS SCHEDULE OPTIMIZATION - Hemoglobin A1c - Hepatitis C Antibody - Normal pap 11/2016, repeat PP  2. History of postpartum depression, currently pregnant - Has seen [facility], declines additional services   3. History of gestational diabetes mellitus (GDM) - HgbA1c today  - Patient had good control with diet in last pregnancy   4. Obesity in pregnancy - Hgb A1c - ASA recommended   Preterm labor/second trimester symptoms and general obstetric precautions including but not limited to vaginal bleeding, contractions, leaking of fluid and fetal movement were reviewed in detail with the patient. Please refer to After Visit Summary for other counseling recommendations.   Return in about 4 weeks (around 06/03/2019) for LOB, Virtual.  Future Appointments  Date Time Provider Department Center  05/24/2019  1:00 PM Gwyndolyn Saxon, LCSW [facility] None    Vonzella Nipple, PA-C

## 2019-05-06 NOTE — Patient Instructions (Signed)
Childbirth Education Options: [facility] Classes:  Childbirth education classes can help you get ready for a positive parenting experience. You can also meet other expectant parents and get free stuff for your baby. Each class runs for five weeks on the same night and costs $45 for the mother-to-be and her support person. Medicaid covers the cost if you are eligible. Call [phone] to register. [facility] Childbirth Education:  [phone] or [phone] or [email or web address]  Baby & Me Class: Discuss newborn & infant parenting and family adjustment issues with other new mothers in a relaxed environment. Each week brings a new speaker or baby-centered activity. We encourage new mothers to join us every Thursday at 11:00am. Babies birth until crawling. No registration or fee. Daddy Boot Camp: This course offers Dads-to-be the tools and knowledge needed to feel confident on their journey to becoming new fathers. Experienced dads, who have been trained as coaches, teach dads-to-be how to hold, comfort, diaper, swaddle and play with their infant while being able to support the new mom as well. A class for men taught by men. $25/dad Big Brother/Big Sister: Let your children share in the joy of a new brother or sister in this special class designed just for them. Class includes discussion about how families care for babies: swaddling, holding, diapering, safety as well as how they can be helpful in their new role. This class is designed for children ages 2 to 6, but any age is welcome. Please register each child individually. $5/child  Mom Talk: This mom-led group offers support and connection to mothers as they journey through the adjustments and struggles of that sometimes overwhelming first year after the birth of a child. Tuesdays at 10:00am and Thursdays at 6:00pm. Babies welcome. No registration or fee. Breastfeeding Support Group: This group is a mother-to-mother  support circle where moms have the opportunity to share their breastfeeding experiences. A Lactation Consultant is present for questions and concerns. Meets each Tuesday at 11:00am. No fee or registration. Breastfeeding Your Baby: Learn what to expect in the first days of breastfeeding your newborn.  This class will help you feel more confident with the skills needed to begin your breastfeeding experience. Many new mothers are concerned about breastfeeding after leaving the hospital. This class will also address the most common fears and challenges about breastfeeding during the first few weeks, months and beyond. (call for fee) Comfort Techniques and Tour: This 2 hour interactive class will provide you the opportunity to learn & practice hands-on techniques that can help relieve some of the discomfort of labor and encourage your baby to rotate toward the best position for birth. You and your partner will be able to try a variety of labor positions with birth balls and rebozos as well as practice breathing, relaxation, and visualization techniques. A tour of the [facility] is included with this class. $20 per registrant and support person Childbirth Class- Weekend Option: This class is a Weekend version of our Birth & Baby series. It is designed for parents who have a difficult time fitting several weeks of classes into their schedule. It covers the care of your newborn and the basics of labor and childbirth. It also includes a Maternity Care Center Tour of [facility] and lunch. The class is held two consecutive days: beginning on Friday evening from 6:30 - 8:30 p.m. and the next day, Saturday from 9 a.m. - 4 p.m. (call for fee) Waterbirth Class: Interested in a waterbirth?  This   informational class will help you discover whether waterbirth is the right fit for you. Education about waterbirth itself, supplies you would need and how to assemble your support team is what you can  expect from this class. Some obstetrical practices require this class in order to pursue a waterbirth. (Not all obstetrical practices offer waterbirth-check with your healthcare provider.) Register only the expectant mom, but you are encouraged to bring your partner to class! Required if planning waterbirth, no fee. Infant/Child CPR: Parents, grandparents, babysitters, and friends learn Cardio-Pulmonary Resuscitation skills for infants and children. You will also learn how to treat both conscious and unconscious choking in infants and children. This Family & Friends program does not offer certification. Register each participant individually to ensure that enough mannequins are available. (Call for fee) Grandparent Love: Expecting a grandbaby? This class is for you! Learn about the latest infant care and safety recommendations and ways to support your own child as he or she transitions into the parenting role. Taught by Registered Nurses who are childbirth instructors, but most importantly...they are grandmothers too! $10/person. Childbirth Class- Natural Childbirth: This series of 5 weekly classes is for expectant parents who want to learn and practice natural methods of coping with the process of labor and childbirth. Relaxation, breathing, massage, visualization, role of the partner, and helpful positioning are highlighted. Participants learn how to be confident in their body's ability to give birth. This class will empower and help parents make informed decisions about their own care. Includes discussion that will help new parents transition into the immediate postpartum period. Maternity Care Center Tour of [facility] is included. We suggest taking this class between 25-32 weeks, but it's only a recommendation. $75 per registrant and one support person or $30 Medicaid. Childbirth Class- 3 week Series: This option of 3 weekly classes helps you and your labor partner prepare for childbirth. Newborn  care, labor & birth, cesarean birth, pain management, and comfort techniques are discussed and a Maternity Care Center Tour of [facility] is included. The class meets at the same time, on the same day of the week for 3 consecutive weeks beginning with the starting date you choose. $60 for registrant and one support person.  Marvelous Multiples: Expecting twins, triplets, or more? This class covers the differences in labor, birth, parenting, and breastfeeding issues that face multiples' parents. NICU tour is included. Led by a Certified Childbirth Educator who is the mother of twins. No fee. Caring for Baby: This class is for expectant and adoptive parents who want to learn and practice the most up-to-date newborn care for their babies. Focus is on birth through the first six weeks of life. Topics include feeding, bathing, diapering, crying, umbilical cord care, circumcision care and safe sleep. Parents learn to recognize symptoms of illness and when to call the pediatrician. Register only the mom-to-be and your partner or support person can plan to come with you! $10 per registrant and support person Childbirth Class- online option: This online class offers you the freedom to complete a Birth and Baby series in the comfort of your own home. The flexibility of this option allows you to review sections at your own pace, at times convenient to you and your support people. It includes additional video information, animations, quizzes, and extended activities. Get organized with helpful eClass tools, checklists, and trackers. Once you register online for the class, you will receive an email within a few days to accept the invitation and begin the class when the time   is right for you. The content will be available to you for 60 days. $60 for 60 days of online access for you and your support people.  Safe Medications in Pregnancy   Acne:  Benzoyl Peroxide  Salicylic Acid   Backache/Headache:  Tylenol: 2  regular strength every 4 hours OR        2 Extra strength every 6 hours   Colds/Coughs/Allergies:  Benadryl (alcohol free) 25 mg every 6 hours as needed  Breath right strips  Claritin  Cepacol throat lozenges  Chloraseptic throat spray  Cold-Eeze- up to three times per day  Cough drops, alcohol free  Flonase (by prescription only)  Guaifenesin  Mucinex  Robitussin DM (plain only, alcohol free)  Saline nasal spray/drops  Sudafed (pseudoephedrine) & Actifed * use only after [redacted] weeks gestation and if you do not have high blood pressure  Tylenol  Vicks Vaporub  Zinc lozenges  Zyrtec   Constipation:  Colace  Ducolax suppositories  Fleet enema  Glycerin suppositories  Metamucil  Milk of magnesia  Miralax  Senokot  Smooth move tea   Diarrhea:  Kaopectate  Imodium A-D   *NO pepto Bismol   Hemorrhoids:  Anusol  Anusol HC  Preparation H  Tucks   Indigestion:  Tums  Maalox  Mylanta  Zantac  Pepcid   Insomnia:  Benadryl (alcohol free) 25mg  every 6 hours as needed  Tylenol PM  Unisom, no Gelcaps   Leg Cramps:  Tums  MagGel   Nausea/Vomiting:  Bonine  Dramamine  Emetrol  Ginger extract  Sea bands  Meclizine  Nausea medication to take during pregnancy:  Unisom (doxylamine succinate 25 mg tablets) Take one tablet daily at bedtime. If symptoms are not adequately controlled, the dose can be increased to a maximum recommended dose of two tablets daily (1/2 tablet in the morning, 1/2 tablet mid-afternoon and one at bedtime).  Vitamin B6 100mg  tablets. Take one tablet twice a day (up to 200 mg per day).   Skin Rashes:  Aveeno products  Benadryl cream or 25mg  every 6 hours as needed  Calamine Lotion  1% cortisone cream   Yeast infection:  Gyne-lotrimin 7  Monistat 7    **If taking multiple medications, please check labels to avoid duplicating the same active ingredients  **take medication as directed on the label  ** Do not exceed 4000 mg of  tylenol in 24 hours  **Do not take medications that contain aspirin or ibuprofen

## 2019-05-07 LAB — OBSTETRIC PANEL, INCLUDING HIV
Antibody Screen: NEGATIVE
Basophils Absolute: 0 10*3/uL (ref 0.0–0.2)
Basos: 0 %
EOS (ABSOLUTE): 0.1 10*3/uL (ref 0.0–0.4)
Eos: 1 %
HIV Screen 4th Generation wRfx: NONREACTIVE
Hematocrit: 37 % (ref 34.0–46.6)
Hemoglobin: 12.3 g/dL (ref 11.1–15.9)
Hepatitis B Surface Ag: NEGATIVE
Immature Grans (Abs): 0 10*3/uL (ref 0.0–0.1)
Immature Granulocytes: 0 %
Lymphocytes Absolute: 1.8 10*3/uL (ref 0.7–3.1)
Lymphs: 28 %
MCH: 29.1 pg (ref 26.6–33.0)
MCHC: 33.2 g/dL (ref 31.5–35.7)
MCV: 88 fL (ref 79–97)
Monocytes Absolute: 0.4 10*3/uL (ref 0.1–0.9)
Monocytes: 6 %
Neutrophils Absolute: 4 10*3/uL (ref 1.4–7.0)
Neutrophils: 65 %
Platelets: 328 10*3/uL (ref 150–450)
RBC: 4.22 x10E6/uL (ref 3.77–5.28)
RDW: 13.3 % (ref 11.7–15.4)
RPR Ser Ql: NONREACTIVE
Rh Factor: POSITIVE
Rubella Antibodies, IGG: 5.69 index (ref >0.99)
WBC: 6.3 10*3/uL (ref 3.4–10.8)

## 2019-05-07 LAB — GC/CHLAMYDIA PROBE AMP (~~LOC~~) NOT AT ARMC
Chlamydia: NEGATIVE
Comment: NEGATIVE
Comment: NORMAL
Neisseria Gonorrhea: NEGATIVE

## 2019-05-07 LAB — HEPATITIS C ANTIBODY: Hep C Virus Ab: <0.1 s/co ratio (ref 0.0–0.9)

## 2019-05-07 LAB — HEMOGLOBIN A1C
Est. average glucose Bld gHb Est-mCnc: 126 mg/dL
Hgb A1c MFr Bld: 6 % — ABNORMAL HIGH (ref 4.8–5.6)

## 2019-05-08 LAB — CULTURE, OB URINE

## 2019-05-08 LAB — URINE CULTURE, OB REFLEX

## 2019-05-18 ENCOUNTER — Encounter: Payer: Self-pay | Admitting: *Deleted

## 2019-05-20 ENCOUNTER — Encounter: Payer: Self-pay | Admitting: *Deleted

## 2019-05-24 ENCOUNTER — Encounter: Payer: Self-pay | Admitting: Licensed Clinical Social Worker

## 2019-05-25 ENCOUNTER — Ambulatory Visit (INDEPENDENT_AMBULATORY_CARE_PROVIDER_SITE_OTHER): Payer: Self-pay | Admitting: Licensed Clinical Social Worker

## 2019-05-25 DIAGNOSIS — Z8659 Personal history of other mental and behavioral disorders: Secondary | ICD-10-CM

## 2019-05-25 DIAGNOSIS — O99891 Other specified diseases and conditions complicating pregnancy: Secondary | ICD-10-CM

## 2019-05-25 NOTE — BH Specialist Note (Signed)
Integrated Behavioral Health Follow Up Visit  MRN: [id number] Name: Jadda Hunsucker  Number of Integrated Behavioral Health Clinician visits: 2 Session Start time: 1:02pm  Session End time: 1:14pm Total time: 12 mins   Type of Service: Integrated Behavioral Health- Individual Interpretor:no  Interpretor Name and Language: none  SUBJECTIVE: Adalae Baysinger is a 29 y.o. female accompanied by n/a Patient was referred by Harlon Flor  for history of postpartum depression. Patient reports the following symptoms/concerns: social isolation Duration of problem:current pregnancy; Severity of problem: mild  OBJECTIVE: Mood: good and Affect: congruent Risk of harm to self or others: no risk of harm to self or others.   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  Reduce symptoms of: social isolation and irritability  2.  Increase knowledge and/or ability of: identify emotions and symptoms   3.  Demonstrate ability to: Self manage symptoms without medication   INTERVENTIONS: Interventions utilized:  Supportive counseling  Standardized Assessments completed: 04/22/2019  ASSESSMENT: Patient currently experiencing depression affecting pregnancy   Patient may benefit from integrated behavioral health   PLAN: 1. Follow up with behavioral health clinician on : four weeks  2. Behavioral recommendations: increase social interaction, continue taking prenatal vitamins, practice mindfulness techniques to alleviate stress and create a self care plan with family to help support  3. Referral(s): none  4. "From scale of 1-10, how likely are you to follow plan?":   Gwyndolyn Saxon, LCSW

## 2019-05-26 ENCOUNTER — Encounter: Payer: Self-pay | Admitting: *Deleted

## 2019-06-03 ENCOUNTER — Telehealth (INDEPENDENT_AMBULATORY_CARE_PROVIDER_SITE_OTHER): Payer: Self-pay | Admitting: Obstetrics and Gynecology

## 2019-06-03 DIAGNOSIS — Z3A17 17 weeks gestation of pregnancy: Secondary | ICD-10-CM

## 2019-06-03 DIAGNOSIS — Z3490 Encounter for supervision of normal pregnancy, unspecified, unspecified trimester: Secondary | ICD-10-CM

## 2019-06-03 DIAGNOSIS — Z8632 Personal history of gestational diabetes: Secondary | ICD-10-CM

## 2019-06-03 DIAGNOSIS — O99891 Other specified diseases and conditions complicating pregnancy: Secondary | ICD-10-CM

## 2019-06-03 DIAGNOSIS — Z8659 Personal history of other mental and behavioral disorders: Secondary | ICD-10-CM

## 2019-06-03 DIAGNOSIS — E282 Polycystic ovarian syndrome: Secondary | ICD-10-CM

## 2019-06-03 NOTE — Progress Notes (Signed)
I connected with  [name] on 06/03/19 at 11:15 AM EDT by telephone and verified that I am speaking with the correct person using two identifiers.   I discussed the limitations, risks, security and privacy concerns of performing an evaluation and management service by telephone and the availability of in person appointments. I also discussed with the patient that there may be a patient responsible charge related to this service. The patient expressed understanding and agreed to proceed.  Janene Madeira Nehal Witting, CMA 06/03/2019  11:04 AM

## 2019-06-03 NOTE — Progress Notes (Signed)
   TELEHEALTH VIRTUAL OBSTETRICS VISIT ENCOUNTER NOTE  I connected with Krystal Glass on 06/03/19 at 11:15 AM EDT by telephone at home and verified that I am speaking with the correct person using two identifiers.   I discussed the limitations, risks, security and privacy concerns of performing an evaluation and management service by telephone and the availability of in person appointments. I also discussed with the patient that there may be a patient responsible charge related to this service. The patient expressed understanding and agreed to proceed.  Subjective:  Krystal Glass is a 29 y.o. G3P1011 at [redacted]w[redacted]d being followed for ongoing prenatal care.  She is currently monitored for the following issues for this low-risk pregnancy and has Obesity in pregnancy; Supervision of low-risk pregnancy; History of postpartum depression, currently pregnant; History of gestational diabetes mellitus (GDM); and PCOS (polycystic ovarian syndrome) on their problem list.  Patient reports no complaints. Reports fetal movement. Denies any contractions, bleeding or leaking of fluid.   The following portions of the patient's history were reviewed and updated as appropriate: allergies, current medications, past family history, past medical history, past social history, past surgical history and problem list.   Objective:   General:  Alert, oriented and cooperative.   Mental Status: Normal mood and affect perceived. Normal judgment and thought content.  Rest of physical exam deferred due to type of encounter  Assessment and Plan:  Pregnancy: G3P1011 at [redacted]w[redacted]d 1. Encounter for supervision of low-risk pregnancy, antepartum   BP 133/80  2. History of gestational diabetes mellitus (GDM)  A1c 6.0 BASA daily- states she forgets some.   Preterm labor symptoms and general obstetric precautions including but not limited to vaginal bleeding, contractions, leaking of fluid and fetal movement were reviewed in detail with  the patient.  I discussed the assessment and treatment plan with the patient. The patient was provided an opportunity to ask questions and all were answered. The patient agreed with the plan and demonstrated an understanding of the instructions. The patient was advised to call back or seek an in-person office evaluation/go to MAU at [facility] for any urgent or concerning symptoms. Please refer to After Visit Summary for other counseling recommendations.   I provided 10 minutes of non-face-to-face time during this encounter.  Return in about 4 weeks (around 07/01/2019) for in person or virtual is ok .  No future appointments.  Venia Carbon, NP [facility] [employer], [facility]

## 2019-06-07 ENCOUNTER — Encounter: Payer: Self-pay | Admitting: Family Medicine

## 2019-06-10 ENCOUNTER — Encounter: Payer: Self-pay | Admitting: General Practice

## 2019-06-15 ENCOUNTER — Ambulatory Visit (HOSPITAL_COMMUNITY): Payer: Self-pay

## 2019-07-01 ENCOUNTER — Telehealth (INDEPENDENT_AMBULATORY_CARE_PROVIDER_SITE_OTHER): Payer: Self-pay | Admitting: Obstetrics and Gynecology

## 2019-07-01 ENCOUNTER — Other Ambulatory Visit: Payer: Self-pay

## 2019-07-01 DIAGNOSIS — Z3490 Encounter for supervision of normal pregnancy, unspecified, unspecified trimester: Secondary | ICD-10-CM

## 2019-07-01 DIAGNOSIS — O99282 Endocrine, nutritional and metabolic diseases complicating pregnancy, second trimester: Secondary | ICD-10-CM

## 2019-07-01 DIAGNOSIS — Z8632 Personal history of gestational diabetes: Secondary | ICD-10-CM

## 2019-07-01 DIAGNOSIS — Z3A21 21 weeks gestation of pregnancy: Secondary | ICD-10-CM

## 2019-07-01 DIAGNOSIS — Z8659 Personal history of other mental and behavioral disorders: Secondary | ICD-10-CM

## 2019-07-01 DIAGNOSIS — E282 Polycystic ovarian syndrome: Secondary | ICD-10-CM

## 2019-07-01 NOTE — Progress Notes (Signed)
   TELEHEALTH OBSTETRICS VISIT ENCOUNTER NOTE  I connected with Krystal Glass on 07/01/19 at 10:15 AM EDT by telephone at home and verified that I am speaking with the correct person using two identifiers.   I discussed the limitations, risks, security and privacy concerns of performing an evaluation and management service by telephone and the availability of in person appointments. I also discussed with the patient that there may be a patient responsible charge related to this service. The patient expressed understanding and agreed to proceed.  Subjective:  Krystal Glass is a 29 y.o. G3P1011 at [redacted]w[redacted]d being followed for ongoing prenatal care.  She is currently monitored for the following issues for this low-risk pregnancy and has Obesity in pregnancy; Supervision of low-risk pregnancy; History of postpartum depression, currently pregnant; History of gestational diabetes mellitus (GDM); and PCOS (polycystic ovarian syndrome) on their problem list.  Patient reports no complaints. Reports fetal movement. Denies any contractions, bleeding or leaking of fluid.   The following portions of the patient's history were reviewed and updated as appropriate: allergies, current medications, past family history, past medical history, past social history, past surgical history and problem list.   Objective:   General:  Alert, oriented and cooperative.   Mental Status: Normal mood and affect perceived. Normal judgment and thought content.  Rest of physical exam deferred due to type of encounter  Assessment and Plan:  Pregnancy: G3P1011 at [redacted]w[redacted]d 1. Encounter for supervision of low-risk pregnancy, antepartum  Had a death in the family; in the car at this time for visit.  BP 3 days ago less than 141/90, however does not recall exact number  Last [place] on 4/29- [place] was normal.   2. History of postpartum depression, currently pregnant  Doing well   3. History of gestational diabetes mellitus  (GDM)  A1c 6.0 BASA daily- states she forgets some.     Preterm labor symptoms and general obstetric precautions including but not limited to vaginal bleeding, contractions, leaking of fluid and fetal movement were reviewed in detail with the patient.  I discussed the assessment and treatment plan with the patient. The patient was provided an opportunity to ask questions and all were answered. The patient agreed with the plan and demonstrated an understanding of the instructions. The patient was advised to call back or seek an in-person office evaluation/go to [facility] at [facility] for any urgent or concerning symptoms. Please refer to After Visit Summary for other counseling recommendations.   I provided 10 minutes of non-face-to-face time during this encounter.  No follow-ups on file.  No future appointments.  Venia Carbon, NP [facility] [employer], [facility]

## 2019-07-01 NOTE — Progress Notes (Signed)
I connected with  Krystal Glass on 07/01/19 at 10:15 AM EDT by telephone and verified that I am speaking with the correct person using two identifiers.   I discussed the limitations, risks, security and privacy concerns of performing an evaluation and management service by telephone and the availability of in person appointments. I also discussed with the patient that there may be a patient responsible charge related to this service. The patient expressed understanding and agreed to proceed.  Krystal Glass, CMA 07/01/2019  10:13 AM

## 2019-07-29 ENCOUNTER — Other Ambulatory Visit: Payer: Self-pay

## 2019-07-29 ENCOUNTER — Ambulatory Visit (INDEPENDENT_AMBULATORY_CARE_PROVIDER_SITE_OTHER): Payer: Self-pay | Admitting: Obstetrics and Gynecology

## 2019-07-29 VITALS — BP 115/77 | HR 84 | Wt 257.9 lb

## 2019-07-29 DIAGNOSIS — Z3A25 25 weeks gestation of pregnancy: Secondary | ICD-10-CM

## 2019-07-29 DIAGNOSIS — E669 Obesity, unspecified: Secondary | ICD-10-CM

## 2019-07-29 DIAGNOSIS — O99210 Obesity complicating pregnancy, unspecified trimester: Secondary | ICD-10-CM

## 2019-07-29 DIAGNOSIS — Z3490 Encounter for supervision of normal pregnancy, unspecified, unspecified trimester: Secondary | ICD-10-CM

## 2019-07-29 NOTE — Patient Instructions (Signed)
Glucose Tolerance Test °Why am I having this test? °The glucose tolerance test (GTT) is done to check how your body processes sugar (glucose). This is one of several tests used to diagnose diabetes (diabetes mellitus). °Your health care provider may recommend this test if you: °· Have a family history of diabetes. °· Are very overweight (obese). °· Have infections that keep coming back (recurring). °· Have had a lot of wounds that did not heal quickly, especially on your legs and feet. °· Are a woman and have a history of giving birth to very large babies or a history of repeated fetal loss (stillbirth). °· Have had high glucose levels in your urine or blood: °? During a past pregnancy. °? After a heart attack, surgery, or prolonged periods of high stress. °What is being tested? °This test measures the amount of glucose in your blood at different times during a period of 2 hours. This indicates how well your body is able to process glucose. °What kind of sample is taken? ° °Blood samples are required for this test. They are usually collected by inserting a needle into a blood vessel. °How do I prepare for this test? °· For 3 days before your test, eat normally. Have plenty of carbohydrate-rich foods. °· Follow instructions from your health care provider about: °? Eating or drinking restrictions on the day of the test. You may be asked to not eat or drink anything other than water (fast) starting 8-12 hours before the test. °? Changing or stopping your regular medicines. Some medicines may interfere with this test. °Tell a health care provider about: °· All medicines you are taking, including vitamins, herbs, eye drops, creams, and over-the-counter medicines. °· Any blood disorders you have. °· Any surgeries you have had. °· Any medical conditions you have. °· Whether you are pregnant or may be pregnant. °What happens during the test? °First, your blood glucose will be measured. This is referred to as your fasting  blood glucose, since you fasted before the test. Then, you will drink a glucose solution that contains a certain amount of glucose. Your blood glucose will be measured again 1 and 2 hours after drinking the solution. °This test takes 2 hours to complete. You will need to stay at the testing location during this time. During the testing period: °· Do not eat or drink anything other than the glucose solution. You will be allowed to drink water. °· Do not exercise. °· Do not use any products that contain nicotine or tobacco, such as cigarettes and e-cigarettes. If you need help stopping, ask your health care provider. °The testing procedure may vary among health care providers and hospitals. °How are the results reported? °Your results will be reported as milligrams of glucose per deciliter of blood (mg/dL) or millimoles per liter (mmol/L). Your health care provider will compare your results to normal ranges that were established after testing a large group of people (reference ranges). Reference ranges may vary among labs and hospitals. For this test, common reference ranges are: °· Fasting: less than 110 mg/dL (6.1 mmol/L). °· 1 hour after drinking glucose: less than 180 mg/dL (10.0 mmol/L). °· 2 hours after drinking glucose: less than 140 mg/dL (7.8 mmol/L). °What do the results mean? °Results that are within the reference ranges are considered normal, meaning that your glucose levels are well controlled. Results higher than the reference ranges may mean that you recently experienced stress, such as from an injury or a sudden (acute) condition like a   heart attack or stroke, or that you have: °· Diabetes. °· Cushing syndrome. °· Tumors such as pheochromocytoma or glucagonoma. °· Kidney failure. °· Pancreatitis. °· Hyperthyroidism. °· An infection. °Talk with your health care provider about what your results mean. °Questions to ask your health care provider °Ask your health care provider, or the department that is  doing the test: °· When will my results be ready? °· How will I get my results? °· What are my treatment options? °· What other tests do I need? °· What are my next steps? °Summary °· The glucose tolerance test (GTT) is done to check how your body processes sugar (glucose). This is one of several tests used to diagnose diabetes (diabetes mellitus). °· This test measures the amount of glucose in your blood at different times during a period of 2 hours. This indicates how well your body is able to process glucose. °· Talk with your health care provider about what your results mean. °This information is not intended to replace advice given to you by your health care provider. Make sure you discuss any questions you have with your health care provider. °Document Revised: 01/10/2017 Document Reviewed: 09/09/2016 °Elsevier Patient Education © 2020 [employer] ° °

## 2019-07-29 NOTE — Progress Notes (Signed)
   PRENATAL VISIT NOTE  Subjective:  Krystal Glass is a 29 y.o. G3P1011 at [redacted]w[redacted]d being seen today for ongoing prenatal care.  She is currently monitored for the following issues for this low-risk pregnancy and has Obesity in pregnancy; Supervision of low-risk pregnancy; History of postpartum depression, currently pregnant; History of gestational diabetes mellitus (GDM); and PCOS (polycystic ovarian syndrome) on their problem list.  Patient reports no complaints.  Contractions: Not present. Vag. Bleeding: None.  Movement: Present. Denies leaking of fluid.   The following portions of the patient's history were reviewed and updated as appropriate: allergies, current medications, past family history, past medical history, past social history, past surgical history and problem list.   Objective:   Vitals:   07/29/19 1006  BP: 115/77  Pulse: 84  Weight: 257 lb 14.4 oz (117 kg)    Fetal Status: Fetal Heart Rate (bpm): 144 Fundal Height: 26 cm Movement: Present     General:  Alert, oriented and cooperative. Patient is in no acute distress.  Skin: Skin is warm and dry. No rash noted.   Cardiovascular: Normal heart rate noted  Respiratory: Normal respiratory effort, no problems with respiration noted  Abdomen: Soft, gravid, appropriate for gestational age.  Pain/Pressure: Absent     Pelvic: Cervical exam deferred        Extremities: Normal range of motion.  Edema: None  Mental Status: Normal mood and affect. Normal behavior. Normal judgment and thought content.   Assessment and Plan:  Pregnancy: G3P1011 at [redacted]w[redacted]d  1. Encounter for supervision of low-risk pregnancy, antepartum  Needs f/u US. [facility] not able to complete US d/t fetal position. Needs growth 2/2 to maternal obesity.   2. Obesity in pregnancy  Continue BASA BP good today.  Ac1: 6.0   Preterm labor symptoms and general obstetric precautions including but not limited to vaginal bleeding, contractions, leaking of fluid and  fetal movement were reviewed in detail with the patient. Please refer to After Visit Summary for other counseling recommendations.   Return for Return around 28 weeks for 2 hour GTT, come fasting .  Future Appointments  Date Time Provider Department Center  08/20/2019  8:40 AM [facility]  08/20/2019 10:35 AM Gaynel Schaafsma, Harolyn Rutherford, NP [facility]    Venia Carbon, NP

## 2019-07-29 NOTE — Progress Notes (Signed)
Pt states is having more d/c than she is used too, no odor nor irritation. Also she's having hip pain, more at night. Pt is also concerned that her Mom & Sister has Thyroid problems & she wants to make sure that she doesn't have any.

## 2019-07-29 NOTE — Progress Notes (Signed)
Called HD to schedule Follow Up U/S thru [facility]. They are booked thru June, was advised to call next week to see if [facility] Calendar is open.

## 2019-08-02 ENCOUNTER — Encounter: Payer: Self-pay | Admitting: Obstetrics and Gynecology

## 2019-08-20 ENCOUNTER — Other Ambulatory Visit: Payer: Self-pay

## 2019-08-20 ENCOUNTER — Ambulatory Visit (INDEPENDENT_AMBULATORY_CARE_PROVIDER_SITE_OTHER): Payer: Self-pay | Admitting: Obstetrics and Gynecology

## 2019-08-20 VITALS — BP 112/72 | HR 85 | Wt 264.2 lb

## 2019-08-20 DIAGNOSIS — Z3490 Encounter for supervision of normal pregnancy, unspecified, unspecified trimester: Secondary | ICD-10-CM

## 2019-08-20 DIAGNOSIS — Z8759 Personal history of other complications of pregnancy, childbirth and the puerperium: Secondary | ICD-10-CM

## 2019-08-20 DIAGNOSIS — O99343 Other mental disorders complicating pregnancy, third trimester: Secondary | ICD-10-CM

## 2019-08-20 DIAGNOSIS — F419 Anxiety disorder, unspecified: Secondary | ICD-10-CM

## 2019-08-20 DIAGNOSIS — Z3A28 28 weeks gestation of pregnancy: Secondary | ICD-10-CM

## 2019-08-20 DIAGNOSIS — Z23 Encounter for immunization: Secondary | ICD-10-CM

## 2019-08-20 DIAGNOSIS — Z3493 Encounter for supervision of normal pregnancy, unspecified, third trimester: Secondary | ICD-10-CM

## 2019-08-20 MED ORDER — HYDROXYZINE PAMOATE 50 MG PO CAPS
50.0000 mg | ORAL_CAPSULE | Freq: Every day | ORAL | 0 refills | Status: DC
Start: 2019-08-20 — End: 2019-11-17

## 2019-08-20 MED ORDER — BUSPIRONE HCL 7.5 MG PO TABS: 15.0000 mg | ORAL_TABLET | Freq: Three times a day (TID) | ORAL | 1 refills | Status: DC

## 2019-08-20 NOTE — Progress Notes (Signed)
   PRENATAL VISIT NOTE  Subjective:  Krystal Glass is a 29 y.o. G3P1011 at [redacted]w[redacted]d being seen today for ongoing prenatal care.  She is currently monitored for the following issues for this high-risk pregnancy and has Obesity in pregnancy; Supervision of low-risk pregnancy; History of postpartum depression, currently pregnant; History of gestational diabetes mellitus (GDM); PCOS (polycystic ovarian syndrome); Anxiety; and Gestational diabetes mellitus on their problem list.  Patient reports anxiety.  Contractions: Not present. Vag. Bleeding: None.  Movement: Present. Denies leaking of fluid.   The following portions of the patient's history were reviewed and updated as appropriate: allergies, current medications, past family history, past medical history, past social history, past surgical history and problem list.   Objective:   Vitals:   08/20/19 0855  BP: 112/72  Pulse: 85  Weight: 264 lb 3.2 oz (119.8 kg)    Fetal Status: Fetal Heart Rate (bpm): 162   Movement: Present     General:  Alert, oriented and cooperative. Patient is in no acute distress.  Skin: Skin is warm and dry. No rash noted.   Cardiovascular: Normal heart rate noted  Respiratory: Normal respiratory effort, no problems with respiration noted  Abdomen: Soft, gravid, appropriate for gestational age.  Pain/Pressure: Present     Pelvic: Cervical exam deferred        Extremities: Normal range of motion.  Edema: None  Mental Status: Normal mood and affect. Normal behavior. Normal judgment and thought content.   Assessment and Plan:   1. Encounter for supervision of low-risk pregnancy, antepartum  - Tdap vaccine greater than or equal to 7yo IM - She needs f/u US d/t pelvic fullness seen on previous US. Unable to pay the [facility] $140 up front cost. Feels if the [place] is rescheduled she will find a way to cover the cost.   2. Anxiety  Strong hx of postpartum depression.  Feels extremely overwhelmed with having a toddler  at home and being pregnant. Her partner works away from the home and works long hours. Mom feels like she is constantly giving and never has a break. She doesn't do anything for her self on a regular basis. She is starting to have panic attacks.   Anxiety started in high school and she started cutting herself. She doesn't have thoughts currently to harm her self or her child. She see's her future as bright and important and knows hurting herself wouldn't change her life. She is interested in seeing Asher Muir, and starting medication for anxiety.  We talked about the importance of deep/thoughtful breathing and meditation daily. Discussed the importance of getting outside everyday and asking partner for 30 mins - 1 hour of alone time daily.   Rx: Buspar and vistaril Close f/u in one week in the office    Preterm labor symptoms and general obstetric precautions including but not limited to vaginal bleeding, contractions, leaking of fluid and fetal movement were reviewed in detail with the patient. Please refer to After Visit Summary for other counseling recommendations.   Return in about 1 week (around 08/27/2019), or In person visit with me for mood check.  Future Appointments  Date Time Provider Department Center  08/26/2019 11:00 AM Rooney Swails, Harolyn Rutherford, NP [facility]  09/07/2019 10:15 AM [facility]    Venia Carbon, NP

## 2019-08-21 ENCOUNTER — Encounter: Payer: Self-pay | Admitting: Obstetrics and Gynecology

## 2019-08-21 DIAGNOSIS — O24419 Gestational diabetes mellitus in pregnancy, unspecified control: Secondary | ICD-10-CM | POA: Insufficient documentation

## 2019-08-21 LAB — GLUCOSE TOLERANCE, 2 HOURS W/ 1HR
Glucose, 1 hour: 207 mg/dL — ABNORMAL HIGH (ref 65–179)
Glucose, 2 hour: 144 mg/dL (ref 65–152)
Glucose, Fasting: 113 mg/dL — ABNORMAL HIGH (ref 65–91)

## 2019-08-21 LAB — RPR: RPR Ser Ql: NONREACTIVE

## 2019-08-21 LAB — CBC
Hematocrit: 34.2 % (ref 34.0–46.6)
Hemoglobin: 11.2 g/dL (ref 11.1–15.9)
MCH: 28 pg (ref 26.6–33.0)
MCHC: 32.7 g/dL (ref 31.5–35.7)
MCV: 86 fL (ref 79–97)
Platelets: 331 10*3/uL (ref 150–450)
RBC: 4 x10E6/uL (ref 3.77–5.28)
RDW: 13 % (ref 11.7–15.4)
WBC: 9.2 10*3/uL (ref 3.4–10.8)

## 2019-08-21 LAB — HIV ANTIBODY (ROUTINE TESTING W REFLEX): HIV Screen 4th Generation wRfx: NONREACTIVE

## 2019-08-23 ENCOUNTER — Telehealth (INDEPENDENT_AMBULATORY_CARE_PROVIDER_SITE_OTHER): Payer: Self-pay

## 2019-08-23 DIAGNOSIS — Z3493 Encounter for supervision of normal pregnancy, unspecified, third trimester: Secondary | ICD-10-CM

## 2019-08-23 NOTE — Telephone Encounter (Signed)
Pt reported on Friday, 08/20/19, that at her recent [facility] on 08/19/19 she was told her [place] could not be completed because she was unable to pay $140 at time of visit. Pt was unaware of charge. Called [facility] Department to reschedule follow-up US per Rasch, NP. Appt scheduled for 09/09/19 at 9AM. Spoke with billing department who states pt is listed as having 40% discount coverage for health department services. States pt is not part of [employer] program. Per chart review, pt was referred to our office through [employer] program; document scanned on 04/02/19. [facility] billing department states they will reach out to [employer] liaison and will call our office back tomorrow. Clinical staff will continue to follow up and will reach out to pt with new information.

## 2019-08-24 ENCOUNTER — Telehealth: Payer: Self-pay

## 2019-08-24 NOTE — Telephone Encounter (Signed)
-----   Message from Duane Lope, NP sent at 08/21/2019 11:03 PM EDT ----- Please call this patient and notify her that she did not pass her 2 hour GTT test. She has gestational DM and should be referred to diabetes coordinator.   Thank you,  Victorino Dike, NP

## 2019-08-24 NOTE — Telephone Encounter (Signed)
Called pt to advise of 2hr GTT results showing that she has Gestational Diabetes & that some one will be contacting her to schedule appointment with Nutrition/Diabetes. Pt verbalized understanding.

## 2019-08-25 NOTE — Telephone Encounter (Signed)
[employer] liason returned phone call to our office and states pt is a part of this program. Explained the program only covers the cost of initial [place], otherwise pt will be responsible for the cost.  Called pt with update; VM left stating I will send a MyChart message. MyChart message sent.

## 2019-08-26 ENCOUNTER — Encounter: Payer: Self-pay | Admitting: Obstetrics and Gynecology

## 2019-09-01 ENCOUNTER — Ambulatory Visit (INDEPENDENT_AMBULATORY_CARE_PROVIDER_SITE_OTHER): Payer: Self-pay | Admitting: Women's Health

## 2019-09-01 ENCOUNTER — Other Ambulatory Visit (HOSPITAL_COMMUNITY)
Admission: RE | Admit: 2019-09-01 | Discharge: 2019-09-01 | Disposition: A | Discharge status: Home / Self Care | Payer: Self-pay | Source: Ambulatory Visit | Attending: Women's Health | Admitting: Women's Health

## 2019-09-01 ENCOUNTER — Other Ambulatory Visit: Payer: Self-pay

## 2019-09-01 VITALS — BP 105/57 | HR 96 | Wt 265.5 lb

## 2019-09-01 DIAGNOSIS — B9689 Other specified bacterial agents as the cause of diseases classified elsewhere: Secondary | ICD-10-CM

## 2019-09-01 DIAGNOSIS — O23593 Infection of other part of genital tract in pregnancy, third trimester: Secondary | ICD-10-CM

## 2019-09-01 DIAGNOSIS — O98813 Other maternal infectious and parasitic diseases complicating pregnancy, third trimester: Secondary | ICD-10-CM

## 2019-09-01 DIAGNOSIS — Z3A30 30 weeks gestation of pregnancy: Secondary | ICD-10-CM

## 2019-09-01 DIAGNOSIS — N898 Other specified noninflammatory disorders of vagina: Secondary | ICD-10-CM

## 2019-09-01 DIAGNOSIS — O99891 Other specified diseases and conditions complicating pregnancy: Secondary | ICD-10-CM

## 2019-09-01 DIAGNOSIS — F419 Anxiety disorder, unspecified: Secondary | ICD-10-CM

## 2019-09-01 DIAGNOSIS — Z8759 Personal history of other complications of pregnancy, childbirth and the puerperium: Secondary | ICD-10-CM

## 2019-09-01 DIAGNOSIS — B373 Candidiasis of vulva and vagina: Secondary | ICD-10-CM

## 2019-09-01 DIAGNOSIS — O99343 Other mental disorders complicating pregnancy, third trimester: Secondary | ICD-10-CM

## 2019-09-01 DIAGNOSIS — Z1329 Encounter for screening for other suspected endocrine disorder: Secondary | ICD-10-CM

## 2019-09-01 DIAGNOSIS — Z3493 Encounter for supervision of normal pregnancy, unspecified, third trimester: Secondary | ICD-10-CM

## 2019-09-01 DIAGNOSIS — O24419 Gestational diabetes mellitus in pregnancy, unspecified control: Secondary | ICD-10-CM

## 2019-09-01 DIAGNOSIS — O26893 Other specified pregnancy related conditions, third trimester: Secondary | ICD-10-CM | POA: Insufficient documentation

## 2019-09-01 NOTE — Patient Instructions (Signed)
Maternity Assessment Unit (MAU)  The Maternity Assessment Unit (MAU) is located at the [facility] at [facility]. The address is: [address], [place], [place], [place] [address]. Please see map below for additional directions.    The Maternity Assessment Unit is designed to help you during your pregnancy, and for up to 6 weeks after delivery, with any pregnancy- or postpartum-related emergencies, if you think you are in labor, or if your water has broken. For example, if you experience nausea and vomiting, vaginal bleeding, severe abdominal or pelvic pain, elevated blood pressure or other problems related to your pregnancy or postpartum time, please come to the Maternity Assessment Unit for assistance.        Perinatal Anxiety When a woman feels excessive tension or worry (anxiety) during pregnancy or during the first 12 months after she gives birth, she has a condition called perinatal anxiety. Anxiety can interfere with work, school, relationships, and other everyday activities. If it is not managed properly, it can also cause problems in the mother and her baby.  If you are pregnant and you have symptoms of an anxiety disorder, it is important to talk with your health care provider. What are the causes? The exact cause of this condition is not known. Hormonal changes during and after pregnancy may play a role in causing perinatal anxiety. What increases the risk? You are more likely to develop this condition if:  You have a personal or family history of depression, anxiety, or mood disorders.  You experience a stressful life event during pregnancy, such as the death of a loved one.  You have a lot of regular life stress, such as being a single parent.  You have thyroid problems. What are the signs or symptoms? Perinatal anxiety can be different for everyone. It may include:  Panic attacks (panic disorder). These are intense episodes of fear or  discomfort that may also cause sweating, nausea, shortness of breath, or fear of dying. They usually last 5-15 minutes.  Reliving an upsetting (traumatic) event through distressing thoughts, dreams, or flashbacks (post-traumatic stress disorder, or PTSD).  Excessive worry about multiple problems (generalized anxiety disorder).  Fear and stress about leaving certain people or loved ones (separation anxiety).  Performing repetitive tasks (compulsions) to relieve stress or worry (obsessive compulsive disorder, or OCD).  Fear of certain objects or situations (phobias).  Excessive worrying, such as a constant feeling that something bad is going to happen.  Inability to relax.  Difficulty concentrating.  Sleep problems.  Frequent nightmares or disturbing thoughts. How is this diagnosed? This condition is diagnosed based on a physical exam and mental evaluation. In some cases, your health care provider may use an anxiety screening tool. These tools include a list of questions that can help a health care provider diagnose anxiety. Your health care provider may refer you to a mental health expert who specializes in anxiety. How is this treated? This condition may be treated with:  Medicines. Your health care provider will only give you medicines that have been proven safe for pregnancy and breastfeeding.  Talk therapy with a mental health professional to help change your patterns of thinking (cognitive behavioral therapy).  Mindfulness-based stress reduction.  Other relaxation therapies, such as deep breathing or guided muscle relaxation.  Support groups. Follow these instructions at home: Lifestyle  Do not use any products that contain nicotine or tobacco, such as cigarettes and e-cigarettes. If you need help quitting, ask your health care provider.  Do not use alcohol when you are pregnant. After your baby is born, limit alcohol intake to no more than 1 drink a day. One drink equals  12 oz of beer, 5 oz of wine, or 1 oz of hard liquor.  Consider joining a support group for new mothers. Ask your health care provider for recommendations.  Take good care of yourself. Make sure you: ? Get plenty of sleep. If you are having trouble sleeping, talk with your health care provider. ? Eat a healthy diet. This includes plenty of fruits and vegetables, whole grains, and lean proteins. ? Exercise regularly, as told by your health care provider. Ask your health care provider what exercises are safe for you. General instructions  Take over-the-counter and prescription medicines only as told by your health care provider.  Talk with your partner or family members about your feelings during pregnancy. Share any concerns or fears that you may have.  Ask for help with tasks or chores when you need it. Ask friends and family members to provide meals, watch your children, or help with cleaning.  Keep all follow-up visits as told by your health care provider. This is important. Contact a health care provider if:  You (or people close to you) notice that you have any symptoms of anxiety or depression.  You have anxiety and your symptoms get worse.  You experience side effects from medicines, such as nausea or sleep problems. Get help right away if:  You feel like hurting yourself, your baby, or someone else. If you ever feel like you may hurt yourself or others, or have thoughts about taking your own life, get help right away. You can go to your nearest emergency department or call:  Your local emergency services (911 in the [place]  A suicide crisis helpline, such as the National Suicide Prevention Lifeline at 912-649-3123. This is open 24 hours a day. Summary  Perinatal anxiety is when a woman feels excessive tension or worry during pregnancy or during the first 12 months after she gives birth.  Perinatal anxiety may include panic attacks, post-traumatic stress disorder,  separation anxiety, phobias, or generalized anxiety.  Perinatal anxiety can cause physical health problems in the mother and baby if not properly managed.  This condition is treated with medicines, talk therapy, stress reduction therapies, or a combination of two or more treatments.  Talk with your partner or family members about your concerns or fears. Do not be afraid to ask for help. This information is not intended to replace advice given to you by your health care provider. Make sure you discuss any questions you have with your health care provider. Document Revised: 01/31/2017 Document Reviewed: 03/27/2016 Elsevier Patient Education  2020 Elsevier Inc.        Perinatal Depression When a woman feels excessive sadness, anger, or anxiety during pregnancy or during the first 12 months after she gives birth, she has a condition called perinatal depression. Depression can interfere with work, school, relationships, and other everyday activities. If it is not managed properly, it can also cause problems in the mother and her baby. Sometimes, perinatal depression is left untreated because symptoms are thought to be normal mood swings during and right after pregnancy. If you have symptoms of depression, it is important to talk with your health care provider. What are the causes? The exact cause of this condition is not known. Hormonal changes during and after pregnancy may play a role in causing perinatal depression. What increases the risk?  You are more likely to develop this condition if:  You have a personal or family history of depression, anxiety, or mood disorders.  You experience a stressful life event during pregnancy, such as the death of a loved one.  You have a lot of regular life stress.  You do not have support from family members or loved ones, or you are in an abusive relationship. What are the signs or symptoms? Symptoms of this condition include:  Feeling sad or  hopeless.  Feelings of guilt.  Feeling irritable or overwhelmed.  Changes in your appetite.  Lack of energy or motivation.  Sleep problems.  Difficulty concentrating or completing tasks.  Loss of interest in hobbies or relationships.  Headaches or stomach problems that do not go away. How is this diagnosed? This condition is diagnosed based on a physical exam and mental evaluation. In some cases, your health care provider may use a depression screening tool. These tools include a list of questions that can help a health care provider diagnose depression. Your health care provider may refer you to a mental health expert who specializes in depression. How is this treated? This condition may be treated with:  Medicines. Your health care provider will only give you medicines that have been proven safe for pregnancy and breastfeeding.  Talk therapy with a mental health professional to help change your patterns of thinking (cognitive behavioral therapy).  Support groups.  Brain stimulation or light therapies.  Stress reduction therapies, such as mindfulness. Follow these instructions at home: Lifestyle  Do not use any products that contain nicotine or tobacco, such as cigarettes and e-cigarettes. If you need help quitting, ask your health care provider.  Do not use alcohol when you are pregnant. After your baby is born, limit alcohol intake to no more than 1 drink a day. One drink equals 12 oz of beer, 5 oz of wine, or 1 oz of hard liquor.  Consider joining a support group for new mothers. Ask your health care provider for recommendations.  Take good care of yourself. Make sure you: ? Get plenty of sleep. If you are having trouble sleeping, talk with your health care provider. ? Eat a healthy diet. This includes plenty of fruits and vegetables, whole grains, and lean proteins. ? Exercise regularly, as told by your health care provider. Ask your health care provider what  exercises are safe for you. General instructions  Take over-the-counter and prescription medicines only as told by your health care provider.  Talk with your partner or family members about your feelings during pregnancy. Share any concerns or anxieties that you may have.  Ask for help with tasks or chores when you need it. Ask friends and family members to provide meals, watch your children, or help with cleaning.  Keep all follow-up visits as told by your health care provider. This is important. Contact a health care provider if:  You (or people close to you) notice that you have any symptoms of depression.  You have depression and your symptoms get worse.  You experience side effects from medicines, such as nausea or sleep problems. Get help right away if:  You feel like hurting yourself, your baby, or someone else. If you ever feel like you may hurt yourself or others, or have thoughts about taking your own life, get help right away. You can go to your nearest emergency department or call:  Your local emergency services (911 in the [place]  A suicide crisis helpline, such as  the National Suicide Prevention Lifeline at (616)800-6741. This is open 24 hours a day. Summary  Perinatal depression is when a woman feels excessive sadness, anger, or anxiety during pregnancy or during the first 12 months after she gives birth.  If perinatal depression is not treated, it can lead to health problems for the mother and her baby.  This condition is treated with medicines, talk therapy, stress reduction therapies, or a combination of two or more treatments.  Talk with your partner or family members about your feelings. Do not be afraid to ask for help. This information is not intended to replace advice given to you by your health care provider. Make sure you discuss any questions you have with your health care provider. Document Revised: 07/15/2018 Document Reviewed: 03/27/2016 Elsevier  Patient Education  2020 [employer].        Postpartum Baby Blues The postpartum period begins right after the birth of a baby. During this time, there is often a lot of joy and excitement. It is also a time of many changes in the life of the parents. No matter how many times a mother gives birth, each child brings new challenges to the family, including different ways of relating to one another. It is common to have feelings of excitement along with confusing changes in moods, emotions, and thoughts. You may feel happy one minute and sad or stressed the next. These feelings of sadness usually happen in the period right after you have your baby, and they go away within a week or two. This is called the "baby blues." What are the causes? There is no known cause of baby blues. It is likely caused by a combination of factors. However, changes in hormone levels after childbirth are believed to trigger some of the symptoms. Other factors that can play a role in these mood changes include:  Lack of sleep.  Stressful life events, such as poverty, caring for a loved one, or death of a loved one.  Genetics. What are the signs or symptoms? Symptoms of this condition include:  Brief changes in mood, such as going from extreme happiness to sadness.  Decreased concentration.  Difficulty sleeping.  Crying spells and tearfulness.  Loss of appetite.  Irritability.  Anxiety. If the symptoms of baby blues last for more than 2 weeks or become more severe, you may have postpartum depression. How is this diagnosed? This condition is diagnosed based on an evaluation of your symptoms. There are no medical or lab tests that lead to a diagnosis, but there are various questionnaires that a health care provider may use to identify women with the baby blues or postpartum depression. How is this treated? Treatment is not needed for this condition. The baby blues usually go away on their own in 1-2  weeks. Social support is often all that is needed. You will be encouraged to get adequate sleep and rest. Follow these instructions at home: Lifestyle      Get as much rest as you can. Take a nap when the baby sleeps.  Exercise regularly as told by your health care provider. Some women find yoga and walking to be helpful.  Eat a balanced and nourishing diet. This includes plenty of fruits and vegetables, whole grains, and lean proteins.  Do little things that you enjoy. Have a cup of tea, take a bubble bath, read your favorite magazine, or listen to your favorite music.  Avoid alcohol.  Ask for help with household chores, cooking, grocery  shopping, or running errands. Do not try to do everything yourself. Consider hiring a postpartum doula to help. This is a professional who specializes in providing support to new mothers.  Try not to make any major life changes during pregnancy or right after giving birth. This can add stress. General instructions  Talk to people close to you about how you are feeling. Get support from your partner, family members, friends, or other new moms. You may want to join a support group.  Find ways to cope with stress. This may include: ? Writing your thoughts and feelings in a journal. ? Spending time outside. ? Spending time with people who make you laugh.  Try to stay positive in how you think. Think about the things you are grateful for.  Take over-the-counter and prescription medicines only as told by your health care provider.  Let your health care provider know if you have any concerns.  Keep all postpartum visits as told by your health care provider. This is important. Contact a health care provider if:  Your baby blues do not go away after 2 weeks. Get help right away if:  You have thoughts of taking your own life (suicidal thoughts).  You think you may harm the baby or other people.  You see or hear things that are not there  (hallucinations). Summary  After giving birth, you may feel happy one minute and sad or stressed the next. Feelings of sadness that happen right after the baby is born and go away after a week or two are called the "baby blues."  You can manage the baby blues by getting enough rest, eating a healthy diet, exercising, spending time with supportive people, and finding ways to cope with stress.  If feelings of sadness and stress last longer than 2 weeks or get in the way of caring for your baby, talk to your health care provider. This may mean you have postpartum depression. This information is not intended to replace advice given to you by your health care provider. Make sure you discuss any questions you have with your health care provider. Document Revised: 05/22/2018 Document Reviewed: 03/26/2016 Elsevier Patient Education  2020 [employer].        Preterm Labor and Birth Information  The normal length of a pregnancy is 39-41 weeks. Preterm labor is when labor starts before 37 completed weeks of pregnancy. What are the risk factors for preterm labor? Preterm labor is more likely to occur in women who:  Have certain infections during pregnancy such as a bladder infection, sexually transmitted infection, or infection inside the uterus (chorioamnionitis).  Have a shorter-than-normal cervix.  Have gone into preterm labor before.  Have had surgery on their cervix.  Are younger than age 66 or older than age 3.  Are African American.  Are pregnant with twins or multiple babies (multiple gestation).  Take street drugs or smoke while pregnant.  Do not gain enough weight while pregnant.  Became pregnant shortly after having been pregnant. What are the symptoms of preterm labor? Symptoms of preterm labor include:  Cramps similar to those that can happen during a menstrual period. The cramps may happen with diarrhea.  Pain in the abdomen or lower back.  Regular uterine  contractions that may feel like tightening of the abdomen.  A feeling of increased pressure in the pelvis.  Increased watery or bloody mucus discharge from the vagina.  Water breaking (ruptured amniotic sac). Why is it important to recognize signs of  preterm labor? It is important to recognize signs of preterm labor because babies who are born prematurely may not be fully developed. This can put them at an increased risk for:  Long-term (chronic) heart and lung problems.  Difficulty immediately after birth with regulating body systems, including blood sugar, body temperature, heart rate, and breathing rate.  Bleeding in the brain.  Cerebral palsy.  Learning difficulties.  Death. These risks are highest for babies who are born before 34 weeks of pregnancy. How is preterm labor treated? Treatment depends on the length of your pregnancy, your condition, and the health of your baby. It may involve:  Having a stitch (suture) placed in your cervix to prevent your cervix from opening too early (cerclage).  Taking or being given medicines, such as: ? Hormone medicines. These may be given early in pregnancy to help support the pregnancy. ? Medicine to stop contractions. ? Medicines to help mature the baby's lungs. These may be prescribed if the risk of delivery is high. ? Medicines to prevent your baby from developing cerebral palsy. If the labor happens before 34 weeks of pregnancy, you may need to stay in the hospital. What should I do if I think I am in preterm labor? If you think that you are going into preterm labor, call your health care provider right away. How can I prevent preterm labor in future pregnancies? To increase your chance of having a full-term pregnancy:  Do not use any tobacco products, such as cigarettes, chewing tobacco, and e-cigarettes. If you need help quitting, ask your health care provider.  Do not use street drugs or medicines that have not been prescribed  to you during your pregnancy.  Talk with your health care provider before taking any herbal supplements, even if you have been taking them regularly.  Make sure you gain a healthy amount of weight during your pregnancy.  Watch for infection. If you think that you might have an infection, get it checked right away.  Make sure to tell your health care provider if you have gone into preterm labor before. This information is not intended to replace advice given to you by your health care provider. Make sure you discuss any questions you have with your health care provider. Document Revised: 05/22/2018 Document Reviewed: 06/21/2015 Elsevier Patient Education  2020 [employer]

## 2019-09-01 NOTE — Progress Notes (Signed)
Subjective:  Krystal Glass is a 29 y.o. G3P1011 at [redacted]w[redacted]d being seen today for ongoing prenatal care.  She is currently monitored for the following issues for this high-risk pregnancy and has Obesity in pregnancy; Supervision of low-risk pregnancy; History of postpartum depression, currently pregnant; History of gestational diabetes mellitus (GDM); PCOS (polycystic ovarian syndrome); Anxiety; and Gestational diabetes mellitus on their problem list.  Patient reports patient endorses thick, chunky, white/green vaginal discharge without irritation or odor..  Contractions: Irritability. Vag. Bleeding: None.  Movement: Present. Denies leaking of fluid.   The following portions of the patient's history were reviewed and updated as appropriate: allergies, current medications, past family history, past medical history, past social history, past surgical history and problem list. Problem list updated.  Objective:   Vitals:   09/01/19 1352  BP: (!) 105/57  Pulse: 96  Weight: 265 lb 8 oz (120.4 kg)    Fetal Status: Fetal Heart Rate (bpm): 148   Movement: Present     General:  Alert, oriented and cooperative. Patient is in no acute distress.  Skin: Skin is warm and dry. No rash noted.   Cardiovascular: Normal heart rate noted  Respiratory: Normal respiratory effort, no problems with respiration noted  Abdomen: Soft, gravid, appropriate for gestational age. Pain/Pressure: Present     Pelvic: Vag. Bleeding: None Vag D/C Character: Other (Comment)   Cervical exam deferred        Extremities: Normal range of motion.  Edema: None  Mental Status: Normal mood and affect. Normal behavior. Normal judgment and thought content.   Urinalysis:      Assessment and Plan:  Pregnancy: G3P1011 at [redacted]w[redacted]d  1. Encounter for supervision of low-risk pregnancy in third trimester -ultrasound for 09/09/19 at 9AM at the health department.   2. Gestational diabetes mellitus (GDM) in third trimester, gestational diabetes  method of control unspecified -7/27 @ 10:15 for diabetes education, pt has not yet gotten supplies for monitoring blood sugar -on bASA, pt reports taking every other day  3. History of postpartum depression, currently pregnant - Ambulatory referral to [facility] - pt on Buspar and Vistaril, reports doing well  4. Anxiety - Ambulatory referral to [facility]  5. Thyroid disorder screening - pt was supposed to have with 28 week labs d/t family history, but was not drawn - TSH - T4, free  6. Vaginal discharge during pregnancy in third trimester - Cervicovaginal ancillary only( [place])  Preterm labor symptoms and general obstetric precautions including but not limited to vaginal bleeding, contractions, leaking of fluid and fetal movement were reviewed in detail with the patient. Please refer to After Visit Summary for other counseling recommendations.  I discussed the assessment and treatment plan with the patient. The patient was provided an opportunity to ask questions and all were answered. The patient agreed with the plan and demonstrated an understanding of the instructions. The patient was advised to call back or seek an in-person office evaluation/go to [facility]U at [facility] for any urgent or concerning symptoms. Return in about 2 weeks (around 09/15/2019) for in-person/HOB/MD ONLY, needs [facility] ASAP.   Adyan Palau, Odie Sera, NP

## 2019-09-01 NOTE — Progress Notes (Signed)
Pt states is having a chunky, greenish d/c with no odor nor irritation.

## 2019-09-02 ENCOUNTER — Other Ambulatory Visit: Payer: Self-pay | Admitting: Women's Health

## 2019-09-02 DIAGNOSIS — B3731 Acute candidiasis of vulva and vagina: Secondary | ICD-10-CM

## 2019-09-02 LAB — CERVICOVAGINAL ANCILLARY ONLY
Bacterial Vaginitis (gardnerella): POSITIVE — AB
Candida Glabrata: NEGATIVE
Candida Vaginitis: POSITIVE — AB
Chlamydia: NEGATIVE
Comment: NEGATIVE
Comment: NEGATIVE
Comment: NEGATIVE
Comment: NEGATIVE
Comment: NEGATIVE
Comment: NORMAL
Neisseria Gonorrhea: NEGATIVE
Trichomonas: NEGATIVE

## 2019-09-02 LAB — TSH: TSH: 0.677 u[IU]/mL (ref 0.450–4.500)

## 2019-09-02 LAB — T4, FREE: Free T4: 1.06 ng/dL (ref 0.82–1.77)

## 2019-09-02 MED ORDER — TERCONAZOLE 0.4 % VA CREA: 1.0000 | TOPICAL_CREAM | Freq: Every day | VAGINAL | 0 refills | Status: AC

## 2019-09-02 NOTE — Progress Notes (Signed)
RX terconazole for vaginal yeast.

## 2019-09-07 ENCOUNTER — Ambulatory Visit: Payer: Self-pay | Admitting: Registered"

## 2019-09-07 ENCOUNTER — Encounter: Payer: Self-pay | Attending: Women's Health | Admitting: Registered"

## 2019-09-07 ENCOUNTER — Other Ambulatory Visit: Payer: Self-pay

## 2019-09-07 DIAGNOSIS — O24419 Gestational diabetes mellitus in pregnancy, unspecified control: Secondary | ICD-10-CM | POA: Insufficient documentation

## 2019-09-07 DIAGNOSIS — Z713 Dietary counseling and surveillance: Secondary | ICD-10-CM | POA: Insufficient documentation

## 2019-09-07 NOTE — Progress Notes (Signed)
Patient was seen on 09/07/19 for Gestational Diabetes self-management. EDD 11/07/19. Patient states prior history of GDM. Diet history obtained. Patient eats limited breakfast, snacks during the day but not lunch, and balanced dinners. Pt states this lack of meal structure is due to being active with her 27 yr old. Beverages include water.    Patient had GDM education 2 yrs ago and has remembered most of the information from previous experience. Pt states she was mostly wanting to make sure she knew how to use the glucometer.   The following learning objectives were met by the patient :   States the definition of Gestational Diabetes  States why dietary management is important in controlling blood glucose  Describes the effects of carbohydrates on blood glucose levels  Demonstrates ability to create a balanced meal plan  Demonstrates carbohydrate counting   States when to check blood glucose levels  Demonstrates proper blood glucose monitoring techniques  States the effect of stress and exercise on blood glucose levels  States the importance of limiting caffeine and abstaining from alcohol and smoking  Plan:  Aim for 3 Carbohydrate Choices per meal (45 grams) +/- 1 either way  Aim for 1-2 Carbohydrate Choices per snack Begin reading food labels for Total Carbohydrate of foods If OK with your MD, consider  increasing your activity level by walking, Arm Chair Exercises or other activity daily as tolerated Begin checking Blood Glucose before breakfast and 2 hours after first bite of breakfast, lunch and dinner as directed by MD  Bring Log Book/Sheet and meter to every medical appointment  Baby Scripts: Patient was not introduced to [employer] states she will record on glucose log sheet Take medication if directed by MD  Blood glucose monitor given: [employer] Lot # [id number] Blood glucose reading: 95 mg/dL  Patient instructed to monitor glucose levels: FBS: 60 - 95 mg/dl 2 hour:  <120 mg/dl  Patient received the following handouts:  Nutrition Diabetes and Pregnancy  Carbohydrate Counting List  Blood glucose Log Sheet  mailed out PCOS and Inositol handout after visit  Patient will be seen for follow-up as needed.

## 2019-09-08 ENCOUNTER — Encounter (HOSPITAL_COMMUNITY): Payer: Self-pay | Admitting: Obstetrics and Gynecology

## 2019-09-08 ENCOUNTER — Inpatient Hospital Stay (HOSPITAL_COMMUNITY)
Admission: AD | Admit: 2019-09-08 | Discharge: 2019-09-08 | Disposition: A | Discharge status: Home / Self Care | Payer: Self-pay | Attending: Obstetrics and Gynecology | Admitting: Obstetrics and Gynecology

## 2019-09-08 DIAGNOSIS — O24419 Gestational diabetes mellitus in pregnancy, unspecified control: Secondary | ICD-10-CM | POA: Insufficient documentation

## 2019-09-08 DIAGNOSIS — R109 Unspecified abdominal pain: Secondary | ICD-10-CM | POA: Insufficient documentation

## 2019-09-08 DIAGNOSIS — O99891 Other specified diseases and conditions complicating pregnancy: Secondary | ICD-10-CM

## 2019-09-08 DIAGNOSIS — Z79899 Other long term (current) drug therapy: Secondary | ICD-10-CM | POA: Insufficient documentation

## 2019-09-08 DIAGNOSIS — Z3A31 31 weeks gestation of pregnancy: Secondary | ICD-10-CM | POA: Insufficient documentation

## 2019-09-08 DIAGNOSIS — O212 Late vomiting of pregnancy: Secondary | ICD-10-CM | POA: Insufficient documentation

## 2019-09-08 DIAGNOSIS — F419 Anxiety disorder, unspecified: Secondary | ICD-10-CM | POA: Insufficient documentation

## 2019-09-08 DIAGNOSIS — O26893 Other specified pregnancy related conditions, third trimester: Secondary | ICD-10-CM | POA: Insufficient documentation

## 2019-09-08 DIAGNOSIS — B9689 Other specified bacterial agents as the cause of diseases classified elsewhere: Secondary | ICD-10-CM | POA: Insufficient documentation

## 2019-09-08 DIAGNOSIS — O479 False labor, unspecified: Secondary | ICD-10-CM

## 2019-09-08 DIAGNOSIS — O98813 Other maternal infectious and parasitic diseases complicating pregnancy, third trimester: Secondary | ICD-10-CM | POA: Insufficient documentation

## 2019-09-08 DIAGNOSIS — O4703 False labor before 37 completed weeks of gestation, third trimester: Secondary | ICD-10-CM | POA: Insufficient documentation

## 2019-09-08 DIAGNOSIS — O99283 Endocrine, nutritional and metabolic diseases complicating pregnancy, third trimester: Secondary | ICD-10-CM | POA: Insufficient documentation

## 2019-09-08 DIAGNOSIS — O99413 Diseases of the circulatory system complicating pregnancy, third trimester: Secondary | ICD-10-CM | POA: Insufficient documentation

## 2019-09-08 DIAGNOSIS — N760 Acute vaginitis: Secondary | ICD-10-CM | POA: Insufficient documentation

## 2019-09-08 DIAGNOSIS — R1110 Vomiting, unspecified: Secondary | ICD-10-CM

## 2019-09-08 DIAGNOSIS — O99343 Other mental disorders complicating pregnancy, third trimester: Secondary | ICD-10-CM | POA: Insufficient documentation

## 2019-09-08 DIAGNOSIS — B373 Candidiasis of vulva and vagina: Secondary | ICD-10-CM | POA: Insufficient documentation

## 2019-09-08 DIAGNOSIS — R197 Diarrhea, unspecified: Secondary | ICD-10-CM | POA: Insufficient documentation

## 2019-09-08 DIAGNOSIS — R000 Tachycardia, unspecified: Secondary | ICD-10-CM | POA: Insufficient documentation

## 2019-09-08 DIAGNOSIS — Z20822 Contact with and (suspected) exposure to covid-19: Secondary | ICD-10-CM | POA: Insufficient documentation

## 2019-09-08 LAB — WET PREP, GENITAL
Sperm: NONE SEEN
Trich, Wet Prep: NONE SEEN
Yeast Wet Prep HPF POC: NONE SEEN

## 2019-09-08 LAB — URINALYSIS, ROUTINE W REFLEX MICROSCOPIC
Bilirubin Urine: NEGATIVE
Glucose, UA: NEGATIVE mg/dL
Ketones, ur: 80 mg/dL — AB
Nitrite: NEGATIVE
Protein, ur: NEGATIVE mg/dL
Specific Gravity, Urine: 1.019 (ref 1.005–1.030)
pH: 7 (ref 5.0–8.0)

## 2019-09-08 LAB — SARS CORONAVIRUS 2 BY RT PCR (HOSPITAL ORDER, PERFORMED IN ~~LOC~~ HOSPITAL LAB): SARS Coronavirus 2: NEGATIVE

## 2019-09-08 MED ORDER — ONDANSETRON 4 MG PO TBDP
4.0000 mg | ORAL_TABLET | Freq: Three times a day (TID) | ORAL | 0 refills | Status: DC | PRN
Start: 2019-09-08 — End: 2019-10-13

## 2019-09-08 MED ORDER — METRONIDAZOLE 500 MG PO TABS
500.0000 mg | ORAL_TABLET | Freq: Two times a day (BID) | ORAL | 0 refills | Status: AC
Start: 2019-09-08 — End: 2019-09-15

## 2019-09-08 NOTE — MAU Note (Signed)
Pt presents to MAU with c/o back pain, abdominal pain and diarrhea that started this morning around 1 pm. She had light spotting yesterday, but nothing today. She reports she has had a lot of anxiety attacks over the last week due to high stress environment. +FM

## 2019-09-08 NOTE — MAU Provider Note (Signed)
Chief Complaint:  Abdominal Pain, Back Pain, and Diarrhea  HPI: Krystal Glass is a 29 y.o. G3P1011 at [redacted]w[redacted]d by L/9 who presents to maternity admissions reporting 1 day of sudden onset abdominal pain, back pain, non-bloody diarrhea, and non-bloody emesis. Specifically pt reports tight, upper abdominal pain with onset at ~9:30PM on 7/27 without any known triggers. She had take-out [place] food last night but her partner and son ate the same dish without subsequent symptoms. She then woke up at 1:30AM this AM with low back pain and then 1 episode of non-bloody emesis and 2 episodes of loose diarrhea at 4:30AM. In the MAU pt reports improvement of her abdominal pain but continues to endorse nausea.  Of note pt reports increased anxiety over the past week. She recently started anxiety medications (Buspar, prn hydroxyzine) 1.5 weeks ago. Pt denies prior GI issues with onset of new medications. No known sick contacts but pt does have a young child at home. No history of HTN in prior pregnancies but current pregnancy complicated by A1GDM. Currently using vaginal antifungal (prescribed 7/22) for recent diagnosis of vaginal candidiasis.  She reports good fetal movement, denies LOF, vaginal bleeding, vaginal itching/burning, urinary symptoms, h/a, dizziness, n/v, or fever/chills. No SOB or chest pain.  HPI  Past Medical History: Past Medical History:  Diagnosis Date  . 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 Closed displaced fracture of left clavicle 10/12/2015  . Closed displaced segmental fracture of shaft of left humerus 10/09/2015  . Gestational diabetes   . PCOS (polycystic ovarian syndrome)   . Postpartum depression     Past obstetric history: OB History  Gravida Para Term Preterm AB Living  3 1 1  0 1 1  SAB TAB Ectopic Multiple Live Births  1 0 0 0 1    # Outcome Date GA Lbr Len/2nd Weight Sex Delivery Anes PTL Lv  3 Current           2 SAB 06/2018             Birth  Comments: [facility], then few days later felt something pass and it looked " kinda "like a blood clot and bled for a few days and then took a home upt the next week and was negative.   1 Term 04/12/17 [redacted]w[redacted]d 13:23 / 01:41 3295 g M Vag-Spont None  LIV     Birth Comments: GDM    Past Surgical History: Past Surgical History:  Procedure Laterality Date  . ORIF HUMERUS FRACTURE Left 10/10/2015   Procedure: OPEN REDUCTION INTERNAL FIXATION (ORIF) LEFT HUMERUS AND CLAVICLE FRACTURE;  Surgeon: 10/12/2015, MD;  Location: [facility] OR;  Service: Orthopedics;  Laterality: Left;    Family History: History reviewed. No pertinent family history.  Social History: Social History   Tobacco Use  . Smoking status: Never Smoker  . Smokeless tobacco: Never Used  Vaping Use  . Vaping Use: Never used  Substance Use Topics  . Alcohol use: No  . Drug use: No    Allergies: No Known Allergies  Meds:  Medications Prior to Admission  Medication Sig Dispense Refill Last Dose  . busPIRone (BUSPAR) 7.5 MG tablet Take 2 tablets (15 mg total) by mouth 3 (three) times daily. 90 tablet 1 09/07/2019 at Unknown time  . hydrOXYzine (VISTARIL) 50 MG capsule Take 1 capsule (50 mg total) by mouth at bedtime. 30 capsule 0 Past Week at Unknown time  .  prenatal vitamin w/FE, FA (PRENATAL 1 + 1) 27-1 MG TABS tablet Take 1 tablet by mouth daily. 30 each 6 09/07/2019 at Unknown time  . terconazole (TERAZOL 7) 0.4 % vaginal cream Place 1 applicator vaginally at bedtime for 7 days. 45 g 0 Past Week at Unknown time    ROS:  Review of Systems  Constitutional: Positive for fatigue. Negative for chills and fever.  HENT: Negative for congestion, rhinorrhea and sore throat.   Eyes: Negative for visual disturbance.  Respiratory: Negative for chest tightness and shortness of breath.   Cardiovascular: Negative for chest pain and leg swelling.  Gastrointestinal: Positive for abdominal pain,  diarrhea, nausea and vomiting. Negative for blood in stool and constipation.  Genitourinary: Negative for dysuria, pelvic pain, vaginal bleeding, vaginal discharge and vaginal pain.  Musculoskeletal: Positive for back pain.  Neurological: Positive for light-headedness. Negative for dizziness.  Psychiatric/Behavioral: The patient is nervous/anxious.    I have reviewed patient's Past Medical Hx, Surgical Hx, Family Hx, Social Hx, medications and allergies.   Physical Exam   Patient Vitals for the past 24 hrs:  BP Temp Temp src Pulse Resp SpO2 Weight  09/08/19 0949 (!) 132/73 97.8 F (36.6 C) Oral (!) 113 18 99 % --  09/08/19 0937 -- -- -- -- -- -- (!) 120.7 kg   Constitutional: Well-developed, well-nourished female in no acute distress.  Cardiovascular: mild tachycardia, regular rhythm, no murmurs Respiratory: normal effort, CTAB GI: Abd soft, non-tender, gravid appropriate for gestational age.  MS: Extremities nontender, no edema, normal ROM Neurologic: Alert and oriented x 4. Ext: warm and well perfused GU: Neg CVAT.  PELVIC EXAM: Cervix pink, without lesion, moderate white creamy discharge, vaginal walls and external genitalia normal Bimanual exam: Cervix 0/long/high, soft, midline, neg CMT, uterus nontender, nonenlarged   FHT: Reactive NST, Baseline 150, moderate variability, accelerations present, no decelerations Contractions: rare   Labs: Results for orders placed or performed during the hospital encounter of 09/08/19 (from the past 24 hour(s))  Urinalysis, Routine w reflex microscopic     Status: Abnormal   Collection Time: 09/08/19  9:48 AM  Result Value Ref Range   Color, Urine YELLOW YELLOW   APPearance CLOUDY (A) CLEAR   Specific Gravity, Urine 1.019 1.005 - 1.030   pH 7.0 5.0 - 8.0   Glucose, UA NEGATIVE NEGATIVE mg/dL   Hgb urine dipstick SMALL (A) NEGATIVE   Bilirubin Urine NEGATIVE NEGATIVE   Ketones, ur 80 (A) NEGATIVE mg/dL   Protein, ur NEGATIVE NEGATIVE  mg/dL   Nitrite NEGATIVE NEGATIVE   Leukocytes,Ua LARGE (A) NEGATIVE   RBC / HPF 0-5 0 - 5 RBC/hpf   WBC, UA 11-20 0 - 5 WBC/hpf   Bacteria, UA MANY (A) NONE SEEN   Squamous Epithelial / LPF 21-50 0 - 5   Mucus PRESENT   SARS Coronavirus 2 by RT PCR (hospital order, performed in [facility] hospital lab) Nasopharyngeal Nasopharyngeal Swab     Status: None   Collection Time: 09/08/19 10:35 AM   Specimen: Nasopharyngeal Swab  Result Value Ref Range   SARS Coronavirus 2 NEGATIVE NEGATIVE  Wet prep, genital     Status: Abnormal   Collection Time: 09/08/19 10:36 AM   Specimen: Vaginal  Result Value Ref Range   Yeast Wet Prep HPF POC NONE SEEN NONE SEEN   Trich, Wet Prep NONE SEEN NONE SEEN   Clue Cells Wet Prep HPF POC PRESENT (A) NONE SEEN   WBC, Wet Prep HPF POC MANY (A)  NONE SEEN   Sperm NONE SEEN    O/Positive/-- (03/25 [id number])  Imaging:  No results found.  MAU Course/MDM: Orders Placed This Encounter  Procedures  . SARS Coronavirus 2 by RT PCR (hospital order, [facility]) Nasopharyngeal Nasopharyngeal Swab  . Wet prep, genital  . Urinalysis, Routine w reflex microscopic  . Airborne and Contact precautions  . Discharge patient    Meds ordered this encounter  Medications  . metroNIDAZOLE (FLAGYL) 500 MG tablet    Sig: Take 1 tablet (500 mg total) by mouth 2 (two) times daily for 7 days.    Dispense:  14 tablet    Refill:  0  . ondansetron (ZOFRAN ODT) 4 MG disintegrating tablet    Sig: Take 1 tablet (4 mg total) by mouth every 8 (eight) hours as needed for nausea or vomiting.    Dispense:  10 tablet    Refill:  0    NST reviewed Reviewed exam findings and test results.  Treatments in MAU included physical exam, cervical exam and test results.   Pt discharge with strict bleeding/preterm labor/safety precautions.  Assessment: 1. Abdominal pain, vomiting, and diarrhea  Bacterial Vaginosis: Pt with 1 day of sudden onset of symptoms as noted.  No known sick contacts but given symptoms and young child at home, COVID test performed in MAU which was negative. Pt reports decrease po intake given symptoms but tolerating po fluids well without recurrent diarrhea/emesis. UA notable for 80 ketones, large LE and many bacteria but will not treat empirically given dirty catch (21-50 squamous cells) and no dysuria. Wet prep today notable for clue cells; given symptoms will treat with flagyl. GC/Chlamydia collected and pending at time of discharge. -Emphasized importance of good po intake and return precautions for persistent vomiting or inability to tolerate po intake. -Recommended completion of course for vaginal yeast infection as previously prescribed. -Prescribed flagyl 500mg  BID x7d for symptomatic BV in addition to prn zofran for nausea (per pt request). -Emphasized importance of COVID precautions, especially given increased risk of complications in pregnancy.  2. Anxiety in pregnancy in third trimester, antepartum  Sinus Tachycardia: Pt reports notable anxiety and panic attacks with recently prescribed Buspar and prn hydroxyzine. No reported safety concerns. Taking medications with good adherence. Tachycardic to 120s in MAU; most likely 2/2 anxiety and decreased po intake (improved with po fluids in MAU). No signs/symptoms concerning for PE (Well's Score 1.5 for initial tachycardia indicating low risk). Confirmed sinus tachycardia on EKG in MAU. Borderline tachycardia at prior prenatal appts. Recent thyroid labs on 09/01/19 wnl for pregnancy. -encouraged continued daily adherence with anxiety medications -provided strict return precautions for safety concerns, chest pain, SOB, unilateral leg swelling/pain or other concerning symptoms  3. False labor: -Pt with rare contractions on toco. Reactive NST and cervical exam closed/thick/high. -Provided strict return precautions prior to discharge.    Plan: Discharge home with plan to f/u with prenatal  provider on 8/4 or sooner if concern of return precautions as noted above. Labor precautions and fetal kick counts.  Allergies as of 09/08/2019   No Known Allergies     Medication List    TAKE these medications   busPIRone 7.5 MG tablet Commonly known as: BUSPAR Take 2 tablets (15 mg total) by mouth 3 (three) times daily.   hydrOXYzine 50 MG capsule Commonly known as: Vistaril Take 1 capsule (50 mg total) by mouth at bedtime.   metroNIDAZOLE 500 MG tablet Commonly known as: Flagyl Take 1  tablet (500 mg total) by mouth 2 (two) times daily for 7 days.   ondansetron 4 MG disintegrating tablet Commonly known as: Zofran ODT Take 1 tablet (4 mg total) by mouth every 8 (eight) hours as needed for nausea or vomiting.   prenatal vitamin w/FE, FA 27-1 MG Tabs tablet Take 1 tablet by mouth daily.   terconazole 0.4 % vaginal cream Commonly known as: TERAZOL 7 Place 1 applicator vaginally at bedtime for 7 days.      Lynnda Shields OB Fellow 09/08/2019 1:19 PM

## 2019-09-08 NOTE — Discharge Instructions (Signed)
You were seen today for concern of abdominal pain, vomiting and diarrhea. No signs of early labor on your exam. Your COVID test was negative. Be sure to drink plenty of water and continue current medications as prescribed.  Your wet prep showed bacterial vaginosis. Start flagyl 500mg  twice daily for 7 days.  You may take zofran every 8 hours as needed for nausea.  Please follow-up with your prenatal provider as previously scheduled or sooner if concern of vaginal bleeding, decreased fetal movement, leakage of fluid or contractions every or more for over 2 hours, or any other concerns.

## 2019-09-09 ENCOUNTER — Other Ambulatory Visit: Payer: Self-pay

## 2019-09-09 ENCOUNTER — Other Ambulatory Visit: Payer: Self-pay | Admitting: *Deleted

## 2019-09-09 DIAGNOSIS — O24419 Gestational diabetes mellitus in pregnancy, unspecified control: Secondary | ICD-10-CM

## 2019-09-09 LAB — GC/CHLAMYDIA PROBE AMP (~~LOC~~) NOT AT ARMC
Chlamydia: NEGATIVE
Comment: NEGATIVE
Comment: NORMAL
Neisseria Gonorrhea: NEGATIVE

## 2019-09-09 NOTE — BH Specialist Note (Signed)
Integrated Behavioral Health via Telemedicine Video (Caregility) Visit  09/09/2019 Krystal Glass [id number]  Number of Integrated Behavioral Health visits: 1 Session Start time: 2:19  Session End time: 2:53 Total time: 34  Referring Provider: Donia Ast, NP Type of Visit: Video Krystal/Family location: Home [facility] Provider location: [facility] for [facility]  All persons participating in visit: Krystal Glass and Krystal Glass    Confirmed Krystal's address: Yes  Confirmed Krystal's phone number: Yes  Any changes to demographics: No   Confirmed Krystal's insurance: Yes  Any changes to Krystal's insurance: No   Discussed confidentiality: Yes   I connected with Ahni Bradwell a video enabled telemedicine application (Caregility) and verified that I am speaking with the correct person using two identifiers.     I discussed the limitations of evaluation and management by telemedicine and the availability of in person appointments.  I discussed that the purpose of this visit is to provide behavioral health care while limiting exposure to the novel coronavirus.   Discussed there is a possibility of technology failure and discussed alternative modes of communication if that failure occurs.  I discussed that engaging in this virtual visit, they consent to the provision of behavioral healthcare and the services will be billed under their insurance.  Krystal and/or legal guardian expressed understanding and consented to virtual visit: Yes   PRESENTING CONCERNS: Krystal and/or family reports the following symptoms/concerns: Pt states her primary goal is to prevent postpartum depression as she experienced in the past; pt also concerned about being uninsured. Pt is taking Buspar and Vistaril as prescribed.  Duration of problem: Ongoing; Severity of problem: moderate  STRENGTHS (Protective Factors/Coping Skills): Adhering to treatment; open  to learn new skills  GOALS ADDRESSED: Krystal will: 1.  Reduce symptoms of: anxiety and depression  2.  Increase knowledge and/or ability of: coping skills and healthy habits  3.  Demonstrate ability to: Increase healthy adjustment to current life circumstances  INTERVENTIONS: Interventions utilized:  Mindfulness or Management consultant and Psychoeducation and/or Health Education Standardized Assessments completed: PHQ/GAD in past two weeks  ASSESSMENT: Krystal currently experiencing History of postpartum depression.   Krystal may benefit from psychoeducation and brief therapeutic interventions regarding coping with symptoms of depression and anxiety .  PLAN: 1. Follow up with behavioral health clinician on : Two weeks 2. Behavioral recommendations:  -CALM relaxation breathing exercise twice daily(morning; at bedtime with sleep sounds) -Read through Postpartum Planner (on After Visit Summary) -Consider apps (on AVS) as additional self-care 3. Referral(s): Integrated [employer] (In Clinic)  I discussed the assessment and treatment plan with the Krystal and/or parent/guardian. They were provided an opportunity to ask questions and all were answered. They agreed with the plan and demonstrated an understanding of the instructions.   They were advised to call back or seek an in-person evaluation if the symptoms worsen or if the condition fails to improve as anticipated.  Valetta Close Yehudah Standing  Depression screen [facility] 2/9 09/09/2019 08/20/2019 07/29/2019 07/01/2019 06/03/2019  Decreased Interest 1 1 0 0 0  Down, Depressed, Hopeless 1 2 0 0 0  PHQ - 2 Score 2 3 0 0 0  Altered sleeping 0 1 0 0 0  Tired, decreased energy 2 3 3 1  0  Change in appetite 3 2 2 1  0  Feeling bad or failure about yourself  0 2 0 0 0  Trouble concentrating 1 2 1  0 0  Moving slowly or fidgety/restless 0 1  0 0 0  Suicidal thoughts 0 1 0 0 0  PHQ-9 Score 8 15 6 2  0  Some recent data might be hidden   GAD  7 : Generalized Anxiety Score 09/09/2019 08/20/2019 07/29/2019 07/01/2019  Nervous, Anxious, on Edge 1 3 1 2   Control/stop worrying 1 3 1  0  Worry too much - different things 1 3 1 1   Trouble relaxing 0 3 0 1  Restless 0 2 0 0  Easily annoyed or irritable 2 3 1 1   Afraid - awful might happen 0 0 0 0  Total GAD 7 Score 5 17 4 5   Anxiety Difficulty - - - Somewhat difficult

## 2019-09-15 ENCOUNTER — Encounter: Payer: Self-pay | Admitting: Obstetrics & Gynecology

## 2019-09-15 ENCOUNTER — Ambulatory Visit (INDEPENDENT_AMBULATORY_CARE_PROVIDER_SITE_OTHER): Payer: Self-pay | Admitting: Obstetrics & Gynecology

## 2019-09-15 ENCOUNTER — Other Ambulatory Visit: Payer: Self-pay

## 2019-09-15 VITALS — BP 111/61 | HR 78 | Wt 264.0 lb

## 2019-09-15 DIAGNOSIS — Z8632 Personal history of gestational diabetes: Secondary | ICD-10-CM

## 2019-09-15 DIAGNOSIS — O24419 Gestational diabetes mellitus in pregnancy, unspecified control: Secondary | ICD-10-CM

## 2019-09-15 DIAGNOSIS — O99210 Obesity complicating pregnancy, unspecified trimester: Secondary | ICD-10-CM

## 2019-09-15 MED ORDER — METFORMIN HCL 500 MG PO TABS: 500.0000 mg | ORAL_TABLET | Freq: Every day | ORAL | 2 refills | Status: DC

## 2019-09-15 NOTE — Progress Notes (Signed)
° °  PRENATAL VISIT NOTE  Subjective:  Krystal Glass is a 29 y.o. G3P1011 at [redacted]w[redacted]d being seen today for ongoing prenatal care.  She is currently monitored for the following issues for this high-risk pregnancy and has Obesity in pregnancy; Supervision of low-risk pregnancy; History of postpartum depression, currently pregnant; History of gestational diabetes mellitus (GDM); PCOS (polycystic ovarian syndrome); Anxiety; and Gestational diabetes mellitus on their problem list.  Patient reports no complaints.  Contractions: Irritability. Vag. Bleeding: None.  Movement: Present. Denies leaking of fluid.   The following portions of the patient's history were reviewed and updated as appropriate: allergies, current medications, past family history, past medical history, past social history, past surgical history and problem list.   Objective:   Vitals:   09/15/19 1021  BP: 111/61  Pulse: 78  Weight: 264 lb (119.7 kg)    Fetal Status: Fetal Heart Rate (bpm): 130   Movement: Present     General:  Alert, oriented and cooperative. Patient is in no acute distress.  Skin: Skin is warm and dry. No rash noted.   Cardiovascular: Normal heart rate noted  Respiratory: Normal respiratory effort, no problems with respiration noted  Abdomen: Soft, gravid, appropriate for gestational age.  Pain/Pressure: Present     Pelvic: Cervical exam deferred        Extremities: Normal range of motion.  Edema: None  Mental Status: Normal mood and affect. Normal behavior. Normal judgment and thought content.   Assessment and Plan:  Pregnancy: G3P1011 at [redacted]w[redacted]d 1. Obesity in pregnancy Body mass index is 45.32 kg/m.   2. History of gestational diabetes mellitus (GDM) FBS upper 90's, start medication  3. Gestational diabetes mellitus (GDM) in third trimester, gestational diabetes method of control unspecified Weekly antenatal testing - metFORMIN (GLUCOPHAGE) 500 MG tablet; Take 1 tablet (500 mg total) by mouth at  bedtime.  Dispense: 30 tablet; Refill: 2  Preterm labor symptoms and general obstetric precautions including but not limited to vaginal bleeding, contractions, leaking of fluid and fetal movement were reviewed in detail with the patient. Please refer to After Visit Summary for other counseling recommendations.   Return in about 1 week (around 09/22/2019) for needs to start weekly BPP for diabetes, adopt-a-mom.  Future Appointments  Date Time Provider Department Center  09/16/2019  2:15 PM [facility]    Scheryl Darter, MD

## 2019-09-15 NOTE — Patient Instructions (Signed)
Gestational Diabetes Mellitus, Diagnosis Gestational diabetes (gestational diabetes mellitus) is a short-term (temporary) form of diabetes that can happen during pregnancy. It goes away after you give birth. It may be caused by one or both of these problems:  Your pancreas does not make enough of a hormone called insulin.  Your body does not respond in a normal way to insulin that it makes. Insulin lets sugars (glucose) go into cells in the body. This gives you energy. If you have diabetes, sugars cannot get into cells. This causes high blood sugar (hyperglycemia). If you get gestational diabetes, you are:  More likely to get it if you get pregnant again.  More likely to develop type 2 diabetes in the future. If gestational diabetes is treated, it may not hurt you or your baby. Your doctor will set treatment goals for you. In general, you should have these blood sugar levels:  After not eating for a long time (fasting): 95 mg/dL (5.3 mmol/L).  After meals (postprandial): ? One hour after a meal: at or below 140 mg/dL (7.8 mmol/L). ? Two hours after a meal: at or below 120 mg/dL (6.7 mmol/L).  A1c (hemoglobin A1c) level: 6-6.5%. Follow these instructions at home: Questions to ask your doctor   You may want to ask these questions: ? Do I need to meet with a diabetes educator? ? What equipment will I need to care for myself at home? ? What medicines do I need? When should I take them? ? How often do I need to check my blood sugar? ? What number can I call if I have questions? ? When is my next doctor's visit? General instructions  Take over-the-counter and prescription medicines only as told by your doctor.  Stay at a healthy weight during pregnancy.  Keep all follow-up visits as told by your doctor. This is important. Contact a doctor if:  Your blood sugar is at or above 240 mg/dL (13.3 mmol/L).  Your blood sugar is at or above 200 mg/dL (11.1 mmol/L) and you have ketones in  your pee (urine).  You have been sick or have had a fever for 2 days or more and you are not getting better.  You have any of these problems for more than 6 hours: ? You cannot eat or drink. ? You feel sick to your stomach (nauseous). ? You throw up (vomit). ? You have watery poop (diarrhea). Get help right away if:  Your blood sugar is lower than 54 mg/dL (3 mmol/L).  You get confused.  You have trouble: ? Thinking clearly. ? Breathing.  Your baby moves less than normal.  You have any of these: ? Moderate or large ketone levels in your pee. ? Blood coming from your vagina. ? Unusual fluid coming from your vagina. ? Early contractions. These may feel like tightness in your belly. Summary  Gestational diabetes is a short-term form of diabetes. It can happen while you are pregnant. It goes away after you give birth.  If gestational diabetes is treated, it may not hurt you or your baby. Your doctor will set treatment goals for you.  Keep all follow-up visits as told by your doctor. This is important. This information is not intended to replace advice given to you by your health care provider. Make sure you discuss any questions you have with your health care provider. Document Revised: 03/06/2017 Document Reviewed: 03/03/2015 [employer] Patient Education  2020 [employer]  

## 2019-09-16 ENCOUNTER — Ambulatory Visit (INDEPENDENT_AMBULATORY_CARE_PROVIDER_SITE_OTHER): Payer: Self-pay | Admitting: Clinical

## 2019-09-16 DIAGNOSIS — O99891 Other specified diseases and conditions complicating pregnancy: Secondary | ICD-10-CM

## 2019-09-16 DIAGNOSIS — F419 Anxiety disorder, unspecified: Secondary | ICD-10-CM

## 2019-09-16 DIAGNOSIS — Z3A32 32 weeks gestation of pregnancy: Secondary | ICD-10-CM

## 2019-09-16 DIAGNOSIS — Z8659 Personal history of other mental and behavioral disorders: Secondary | ICD-10-CM

## 2019-09-16 NOTE — Patient Instructions (Signed)
[facility] for [facility] [place], [place] [address] [phone] (main office) [phone] ([place] office)     BRAINSTORMING  Develop a Plan Goals: . Provide a way to start conversation about your new life with a baby . Assist parents in recognizing and using resources within their reach . Help pave the way before birth for an easier period of transition afterwards.  Make a list of the following information to keep in a central location: . Full name of Mom and Partner: _____________________________________________ . 82 full name and Date of Birth: ___________________________________________ . Home Address: ___________________________________________________________ ________________________________________________________________________ . Home Phone: ____________________________________________________________ . Parents' cell numbers: _____________________________________________________ ________________________________________________________________________ . Name and contact info for OB: ______________________________________________ . Name and contact info for Pediatrician:________________________________________ . Contact info for Lactation Consultants: ________________________________________  REST and SLEEP *You each need at least 4-5 hours of uninterrupted sleep every day. Write specific names and contact information.* . How are you going to rest in the postpartum period? While partner's home? When partner returns to work? When you both return to work? Marland Kitchen Where will your baby sleep? Marland Kitchen Who is available to help during the day? Evening? Night? . Who could move in for a period to help support you? Marland Kitchen What are some ideas to help you get enough  sleep? __________________________________________________________________________________________________________________________________________________________________________________________________________________________________________ NUTRITIOUS FOOD AND DRINK *Plan for meals before your baby is born so you can have healthy food to eat during the immediate postpartum period.* . Who will look after breakfast? Lunch? Dinner? List names and contact information. Brainstorm quick, healthy ideas for each meal. . What can you do before baby is born to prepare meals for the postpartum period? . How can others help you with meals? Marland Kitchen Which grocery stores provide online shopping and delivery? Marland Kitchen Which restaurants offer take-out or delivery options? ______________________________________________________________________________________________________________________________________________________________________________________________________________________________________________________________________________________________________________________________________________________________________________________________________  CARE FOR MOM *It's important that mom is cared for and pampered in the postpartum period. Remember, the most important ways new mothers need care are: sleep, nutrition, gentle exercise, and time off.* . Who can come take care of mom during this period? Make a list of people with their contact information. . List some activities that make you feel cared for, rested, and energized? Who can make sure you have opportunities to do these things? . Does mom have a space of her very own within your home that's just for her? Make a "[facility]" where she can be comfortable, rest, and renew herself  daily. ______________________________________________________________________________________________________________________________________________________________________________________________________________________________________________________________________________________________________________________________________________________________________________________________________    CARE FOR AND FEEDING BABY *Knowledgeable and encouraging people will offer the best support with regard to feeding your baby.* . Educate yourself and choose the best feeding option for your baby. . Make a list of people who will guide, support, and be a resource for you as your care for and feed your baby. (Friends that have breastfed or are currently breastfeeding, lactation consultants, breastfeeding support groups, etc.) . Consider a postpartum doula. (These websites can give you information: dona.org & BuyingShow.es) . Seek out local breastfeeding resources like the breastfeeding support group at [employer] or [employer]. ______________________________________________________________________________________________________________________________________________________________________________________________________________________________________________________________________________________________________________________________________________________________________________________________________  Verner Chol AND ERRANDS . Who can help with a thorough cleaning before baby is born? . Make a list of people who will help with housekeeping and chores, like laundry, light cleaning, dishes, bathrooms, etc. . Who can run some errands for you? Marland Kitchen What can you do to make sure you are stocked with basic supplies before baby is born? . Who is going to do the  shopping? ______________________________________________________________________________________________________________________________________________________________________________________________________________________________________________________________________________________________________________________________________________________________________________________________________     Family Adjustment *Nurture yourselves.it helps parents be more loving and allows for better bonding with their child.* . What sorts of things do you and partner enjoy doing together? Which activities help you to connect and strengthen your relationship? Make a list of those things. Make a list of people whom you trust to care for your baby so you can have some time together as a couple. . What types of things help partner feel connected to Mom? Make a list. . What needs will partner have in order to bond with baby? . Other children? Who will care for them when you go into labor and while you are in the hospital? . Think about what the needs of your older children might be. Who can help you meet those needs? In what ways are you helping them prepare for bringing baby home? List some specific strategies you have for family adjustment. _______________________________________________________________________________________________________________________________________________________________________________________________________________________________________________________________________________________________________________________________________________  SUPPORT *Someone who can empathize with experiences normalizes your problems and makes them more bearable.* . Make a list of other friends, neighbors, and/or co-workers you know with infants (and small children, if applicable) with whom you can connect. . Make a list of local or online support groups, mom groups, etc. in which you can be  involved. ______________________________________________________________________________________________________________________________________________________________________________________________________________________________________________________________________________________________________________________________________________________________________________________________________  Childcare Plans . Investigate and plan for childcare if mom is returning to work. . Talk about mom's concerns about her transition back to work. . Talk about partner's concerns regarding this transition.  Mental Health *Your mental health is one of the highest priorities for a pregnant or postpartum mom.* . 1 in 5 women experience anxiety and/or depression from the time of conception through the first year after birth. . Postpartum Mood Disorders are the #1 complication of pregnancy and childbirth and the suffering experienced by these mothers is not necessary! These illnesses are temporary and respond well to treatment, which often includes self-care, social support, talk therapy, and medication when needed. . Women experiencing anxiety and depression often say things like: "I'm supposed to be happy.why do I feel so sad?", "Why can't I snap out of it?", "I'm having thoughts that scare me." . There is no need to be embarrassed if you are feeling these symptoms: o Overwhelmed, anxious, angry, sad, guilty, irritable, hopeless, exhausted but can't sleep o You are NOT alone. You are NOT to blame. With help, you WILL be well. . Where can I find help? Medical professionals such as your OB, midwife, gynecologist, family practitioner, primary care provider, pediatrician, or mental health providers; [facility] support groups: Feelings After Birth, Breastfeeding Support Group, Baby and Me Group, and Fit 4 Two exercise classes. . You have permission to ask for help. It will confirm your feelings, validate your  experiences, share/learn coping strategies, and gain support and encouragement as you heal. You are important! BRAINSTORM . Make a list of local resources, including resources for mom and for partner. . Identify support groups. . Identify people to call late at night - include names and contact info. . Talk with partner about perinatal mood and anxiety disorders. . Talk with your OB, midwife, and doula about baby blues and about perinatal mood and anxiety disorders. . Talk with your pediatrician about perinatal mood and anxiety disorders.   Support & Sanity Savers   What do you really need?  . Basics . In preparing for a new baby, many expectant parents spend hours shopping for baby clothes, decorating the nursery, and deciding which car seat to   buy. Yet most don't think much about what the reality of parenting a newborn will be like, and what they need to make it through that. So, here is the advice of experienced parents. We know you'll read this, and think "they're exaggerating, I don't really need that." Just trust [place] on these, OK? Plan for all of this, and if it turns out you don't need it, come back and teach [place] how you did it!  Satira Anis (Once baby's survival needs are met, make sure you attend to your own survival needs!) . Sleep . An average newborn sleeps 16-18 hours per day, over 6-7 sleep periods, rarely more than three hours at a time. It is normal and healthy for a newborn to wake throughout the night... but really hard on parents!! . Naps. Prioritize sleep above any responsibilities like: cleaning house, visiting friends, running errands, etc.  Sleep whenever baby sleeps. If you can't nap, at least have restful times when baby eats. The more rest you get, the more patient you will be, the more emotionally stable, and better at solving problems.  . Food . You may not have realized it would be difficult to eat when you have a newborn. Yet, when we talk to . countless new  parents, they say things like "it may be 2:00 pm when I realize I haven't had breakfast yet." Or "every time we sit down to dinner, baby needs to eat, and my food gets cold, so I don't bother to eat it." . Finger food. Before your baby is born, stock up with one months' worth of food that: 1) you can eat with one hand while holding a baby, 2) doesn't need to be prepped, 3) is good hot or cold, 4) doesn't spoil when left out for a few hours, and 5) you like to eat. Think about: nuts, dried fruit, Clif bars, pretzels, jerky, gogurt, baby carrots, apples, bananas, crackers, cheez-n-crackers, string cheese, hot pockets or frozen burritos to microwave, garden burgers and breakfast pastries to put in the toaster, yogurt drinks, etc. . Restaurant Menus. Make lists of your favorite restaurants & menu items. When family/friends want to help, you can give specific information without much thought. They can either bring you the food or send gift cards for just the right meals. Rosaura Carpenter Meals.  Take some time to make a few meals to put in the freezer ahead of time.  Easy to freeze meals can be anything such as soup, lasagna, chicken pie, or spaghetti sauce. . Set up a Meal Schedule.  Ask friends and family to sign up to bring you meals during the first few weeks of being home. (It can be passed around at baby showers!) You have no idea how helpful this will be until you are in the throes of parenting.  https://hamilton-woodard.com/ is a great website to check out. . Emotional Support . Know who to call when you're stressed out. Parenting a newborn is very challenging work. There are times when it totally overwhelms your normal coping abilities. EVERY NEW PARENT NEEDS TO HAVE A PLAN FOR WHO TO CALL WHEN THEY JUST CAN'T COPE ANY MORE. (And it has to be someone other than the baby's other parent!) Before your baby is born, come up with at least one person you can call for support - write their phone number down and post it on the  refrigerator. Marland Kitchen Anxiety & Sadness. Baby blues are normal after pregnancy; however, there are more severe types of anxiety &  sadness which can occur and should not be ignored.  They are always treatable, but you have to take the first step by reaching out for help. [facility] offers a "Mom Talk" group which meets every Tuesday from 10 am - 11 am.  This group is for new moms who need support and connection after their babies are born.  Call [phone].  Marland Kitchen Really, Really Helpful (Plan for them! Make sure these happen often!!) . Physical Support with Taking Care of Yourselves . Asking friends and family. Before your baby is born, set up a schedule of people who can come and visit and help out (or ask a friend to schedule for you). Any time someone says "let me know what I can do to help," sign them up for a day. When they get there, their job is not to take care of the baby (that's your job and your joy). Their job is to take care of you!  . Postpartum doulas. If you don't have anyone you can call on for support, look into postpartum doulas:  professionals at helping parents with caring for baby, caring for themselves, getting breastfeeding started, and helping with household tasks. www.padanc.org is a helpful website for learning about doulas in our area. . Peer Support / Parent Groups . Why: One of the greatest ideas for new parents is to be around other new parents. Parent groups give you a chance to share and listen to others who are going through the same season of life, get a sense of what is normal infant development by watching several babies learn and grow, share your stories of triumph and struggles with empathetic ears, and forgive your own mistakes when you realize all parents are learning by trial and error. . Where to find: There are many places you can meet other new parents throughout our community.  [facility] offers the following classes for new moms and their little ones:  Baby  and Me (Birth to [place]) and Breastfeeding Support Group. Go to www.conehealthybaby.com or call [phone] for more information. . Time for your Relationship . It's easy to get so caught up in meeting baby's immediate needs that it's hard to find time to connect with your partner, and meet the needs of your relationship. It's also easy to forget what "quality time with your partner" actually looks like. If you take your baby on a date, you'd be amazed how much of your couple time is spent feeding the baby, diapering the baby, admiring the baby, and talking about the baby. . Dating: Try to take time for just the two of you. Babysitter tip: Sometimes when moms are breastfeeding a newborn, they find it hard to figure out how to schedule outings around baby's unpredictable feeding schedules. Have the babysitter come for a three hour period. When she comes over, if baby has just eaten, you can leave right away, and come back in two hours. If baby hasn't fed recently, you start the date at home. Once baby gets hungry and gets a good feeding in, you can head out for the rest of your date time. . Date Nights at Home: If you can't get out, at least set aside one evening a week to prioritize your relationship: whenever baby dozes off or doesn't have any immediate needs, spend a little time focusing on each other. . Potential conflicts: The main relationship conflicts that come up for new parents are: issues related to sexuality, financial stresses, a feeling of an unfair division  of household tasks, and conflicts in parenting styles. The more you can work on these issues before baby arrives, the better!  . Fun and Frills (Don't forget these. and don't feel guilty for indulging in them!) . Everyone has something in life that is a fun little treat that they do just for themselves. It may be: reading the morning paper, or going for a daily jog, or having coffee with a friend once a week, or going to a movie on Friday  nights, or fine chocolates, or bubble baths, or curling up with a good book. . Unless you do fun things for yourself every now and then, it's hard to have the energy for fun with your baby. Whatever your "special" treats are, make sure you find a way to continue to indulge in them after your baby is born. These special moments can recharge you, and allow you to return to baby with a new joy   PERINATAL MOOD DISORDERS: MATERNAL MENTAL HEALTH FROM CONCEPTION THROUGH THE POSTPARTUM PERIOD   Emergency and Crisis Resources:  If you are an imminent risk to self or others, are experiencing intense personal distress, and/or have noticed significant changes in activities of daily living, call:  . 911 . [facility]: [phone] . Mobile Crisis: 877-626-1772 . National Suicide Hotline: 1-800-273-8255 Or visit the following crisis centers: . Local Emergency Departments . [facility]: [address], [place] [phone]. Hours: 8:30AM-5PM. Insurance Accepted: Medicaid, Medicare, and Uninsured.  . [facility]  [address], [place] Mon-Friday 8am-3pm  [phone]                                                                                    Non-Crisis Resources: To identify specific providers that are covered by your insurance, contact your insurance company or local agencies: [place]: 1-800-256-2452 [facility]: 888-581-9988 [employer]: 1-800-939-5911 Postpartum Support International- [place] 1-800-944-4773                                                      Outpatient therapy and medication management providers:  [facility] [phone] Hours: 9AM-5PM  Insurance Accepted: AARP, Aetna, BCBS, Cigna, [employer], Humana, Medicare  [facility] [phone] Hours: 8AM-5PM  nsurance Accepted: All insurances EXCEPT AARP, Aetna, [employer], and  [employer] [facility]: [phone]             Hours: 8AM-8PM Insurance Accepted: Aetna, BCBS, Cigna, [employer], Medicaid, Medicare, Uninsured [facility]: [phone] [facility]: [phone] Hours: 8:30AM-7PM Insurance Accepted: Aetna, BCBS, Medicaid, Medicare, Tricare, United [facility]:  [phone]              Hours:9AM-5PM Insurance Accepted:  Aetna, BCBS, [facility], Medicaid, United Health Care  [facility] [phone] Hours: 9AM-5:30PM Insurance Accepted: AARP, Aetna, BCBS, Cigna, and Medicaid, Medicare, United Health Care [facility]:  [phone] Hours: 9am-5pm Insurance Accepted: BCBS; they do not accept Medicaid/Medicare   [facility]: [phone] Hours: 9am-9pm Insurance Accepted: All major insurance including Medicaid and Medicare [facility]: [phone] Hours: 9AM-5:30PM Insurance Accepted: All insurances EXCEPT Medicaid and Medicare. [facility]: [place]: [phone] [place]:  [place] (support for children in the NICU and/or with special needs), [employer]: [phone]                                                                                     Online Resources: Postpartum Support International: http://Marter-berg.com/  800-944-4PPD 2Moms Supporting Moms:  www.momssupportingmoms.net  /Emotional [employer] and Websites Here are a few free apps meant to help you to help yourself.  To find, try searching on the internet to see if the app is offered on Apple/Android devices. If your first choice doesn't come up on  your device, the good news is that there are many choices! Play around with different apps to see which ones are helpful to you.    Calm This is an app meant to help increase calm feelings. Includes info, strategies, and tools for tracking your feelings.      Calm Harm  This app is meant to help with self-harm. Provides many 5-minute or 15-min coping strategies for doing instead of hurting yourself.       [place]ops is a problem-solving tool to help deal with emotions and cope with stress you encounter wherever you are.      MindShift This app can help people cope with anxiety. Rather than trying to avoid anxiety, you can make an important shift and face it.      MY3  MY3 features a support system, safety plan and resources with the goal of offering a tool to use in a time of need.       My Life My Voice  This mood journal offers a simple solution for tracking your thoughts, feelings and moods. Animated emoticons can help identify your mood.       Relax Melodies Designed to help with sleep, on this app you can mix sounds and meditations for relaxation.      Smiling Mind Smiling Mind is meditation made easy: it's a simple tool that helps put a smile on your mind.  Stop, Breathe & Think  A friendly, simple guide for people through meditations for mindfulness and compassion.  Stop, Breathe and Think Kids Enter your current feelings and choose a "mission" to help you cope. Offers videos for certain moods instead of just sound recordings.       Team Orange The goal of this tool is to help teens change how they think, act, and react. This app helps you focus on your own good feelings and experiences.      The [place] Box The [place] Box (VHB) contains simple tools to help patients with coping, relaxation, distraction, and positive thinking.

## 2019-09-22 ENCOUNTER — Ambulatory Visit (INDEPENDENT_AMBULATORY_CARE_PROVIDER_SITE_OTHER): Payer: Self-pay | Admitting: Obstetrics & Gynecology

## 2019-09-22 ENCOUNTER — Ambulatory Visit (INDEPENDENT_AMBULATORY_CARE_PROVIDER_SITE_OTHER): Payer: Self-pay | Admitting: *Deleted

## 2019-09-22 ENCOUNTER — Other Ambulatory Visit: Payer: Self-pay

## 2019-09-22 ENCOUNTER — Ambulatory Visit (INDEPENDENT_AMBULATORY_CARE_PROVIDER_SITE_OTHER): Payer: Self-pay

## 2019-09-22 VITALS — BP 100/60 | HR 100 | Wt 265.6 lb

## 2019-09-22 DIAGNOSIS — O0993 Supervision of high risk pregnancy, unspecified, third trimester: Secondary | ICD-10-CM

## 2019-09-22 DIAGNOSIS — O24415 Gestational diabetes mellitus in pregnancy, controlled by oral hypoglycemic drugs: Secondary | ICD-10-CM

## 2019-09-22 MED ORDER — BUSPIRONE HCL 15 MG PO TABS: 15.0000 mg | ORAL_TABLET | Freq: Three times a day (TID) | ORAL | 1 refills | Status: DC

## 2019-09-22 NOTE — Progress Notes (Signed)
   PRENATAL VISIT NOTE  Subjective:  Krystal Glass is a 29 y.o. G3P1011 at [redacted]w[redacted]d being seen today for ongoing prenatal care.  She is currently monitored for the following issues for this high-risk pregnancy and has Obesity in pregnancy; Supervision of low-risk pregnancy; History of postpartum depression, currently pregnant; History of gestational diabetes mellitus (GDM); PCOS (polycystic ovarian syndrome); Anxiety; and Gestational diabetes mellitus on their problem list.  Patient reports no complaints.  Contractions: Irregular. Vag. Bleeding: None.  Movement: Present. Denies leaking of fluid.   The following portions of the patient's history were reviewed and updated as appropriate: allergies, current medications, past family history, past medical history, past social history, past surgical history and problem list.   Objective:   Vitals:   09/22/19 1404  BP: 100/60  Pulse: 100  Weight: 265 lb 9.6 oz (120.5 kg)    Fetal Status: Fetal Heart Rate (bpm): RNST   Movement: Present     General:  Alert, oriented and cooperative. Patient is in no acute distress.  Skin: Skin is warm and dry. No rash noted.   Cardiovascular: Normal heart rate noted  Respiratory: Normal respiratory effort, no problems with respiration noted  Abdomen: Soft, gravid, appropriate for gestational age.  Pain/Pressure: Present     Pelvic: Cervical exam deferred        Extremities: Normal range of motion.  Edema: None  Mental Status: Normal mood and affect. Normal behavior. Normal judgment and thought content.   Assessment and Plan:  Pregnancy: G3P1011 at [redacted]w[redacted]d 1. Encounter for supervision of high risk pregnancy in third trimester, antepartum Continue effective treatment - busPIRone (BUSPAR) 15 MG tablet; Take 1 tablet (15 mg total) by mouth 3 (three) times daily.  Dispense: 90 tablet; Refill: 1  2. Gestational diabetes mellitus (GDM) in third trimester controlled on oral hypoglycemic drug FBS 80's and pp <120 mon  Metformin  Preterm labor symptoms and general obstetric precautions including but not limited to vaginal bleeding, contractions, leaking of fluid and fetal movement were reviewed in detail with the patient. Please refer to After Visit Summary for other counseling recommendations.   Return in about 1 week (around 09/29/2019) for Weekly NST/BPP; HOB in 2 weeks.  Future Appointments  Date Time Provider Department Center  09/29/2019  2:15 PM [facility] NST [facility]  09/30/2019  3:45 PM [facility]    Scheryl Darter, MD

## 2019-09-22 NOTE — Patient Instructions (Signed)

## 2019-09-22 NOTE — Progress Notes (Signed)
Pt has questions about receiving the Covid vaccine during pregnancy. Pt states she is noticing a positive difference since starting Buspar. She requests 30 Ercia Crisafulli supply to be prescribed.

## 2019-09-29 ENCOUNTER — Ambulatory Visit (INDEPENDENT_AMBULATORY_CARE_PROVIDER_SITE_OTHER): Payer: Self-pay

## 2019-09-29 ENCOUNTER — Ambulatory Visit (INDEPENDENT_AMBULATORY_CARE_PROVIDER_SITE_OTHER): Payer: Self-pay | Admitting: *Deleted

## 2019-09-29 ENCOUNTER — Other Ambulatory Visit: Payer: Self-pay

## 2019-09-29 VITALS — BP 116/56 | HR 76 | Wt 265.0 lb

## 2019-09-29 DIAGNOSIS — O24415 Gestational diabetes mellitus in pregnancy, controlled by oral hypoglycemic drugs: Secondary | ICD-10-CM

## 2019-09-30 NOTE — BH Specialist Note (Signed)
Pt requests to reschedule, due to scheduling conflict.

## 2019-10-05 ENCOUNTER — Ambulatory Visit: Payer: Self-pay | Admitting: Clinical

## 2019-10-06 ENCOUNTER — Ambulatory Visit (INDEPENDENT_AMBULATORY_CARE_PROVIDER_SITE_OTHER): Payer: Self-pay

## 2019-10-06 ENCOUNTER — Ambulatory Visit (INDEPENDENT_AMBULATORY_CARE_PROVIDER_SITE_OTHER): Payer: Self-pay | Admitting: Obstetrics & Gynecology

## 2019-10-06 ENCOUNTER — Other Ambulatory Visit: Payer: Self-pay

## 2019-10-06 ENCOUNTER — Encounter: Payer: Self-pay | Admitting: Obstetrics & Gynecology

## 2019-10-06 ENCOUNTER — Ambulatory Visit (INDEPENDENT_AMBULATORY_CARE_PROVIDER_SITE_OTHER): Payer: Self-pay | Admitting: *Deleted

## 2019-10-06 VITALS — BP 112/62 | HR 92 | Wt 263.3 lb

## 2019-10-06 DIAGNOSIS — O24415 Gestational diabetes mellitus in pregnancy, controlled by oral hypoglycemic drugs: Secondary | ICD-10-CM

## 2019-10-06 DIAGNOSIS — R319 Hematuria, unspecified: Secondary | ICD-10-CM

## 2019-10-06 DIAGNOSIS — O0993 Supervision of high risk pregnancy, unspecified, third trimester: Secondary | ICD-10-CM

## 2019-10-06 LAB — POCT URINALYSIS DIP (DEVICE)
Bilirubin Urine: NEGATIVE
Glucose, UA: NEGATIVE mg/dL
Ketones, ur: NEGATIVE mg/dL
Nitrite: NEGATIVE
Protein, ur: NEGATIVE mg/dL
Specific Gravity, Urine: >=1.03 (ref 1.005–1.030)
Urobilinogen, UA: 1 mg/dL (ref 0.0–1.0)
pH: 6 (ref 5.0–8.0)

## 2019-10-06 IMAGING — US US FETAL BPP W/ NON-STRESS
1 series · 14 of 15 positions shown · non-contrast
Comparison: none

[Series 1: us fetal bpp w/ non-stress · 15 acquisitions, 14 frames shown]
[im 1/15]
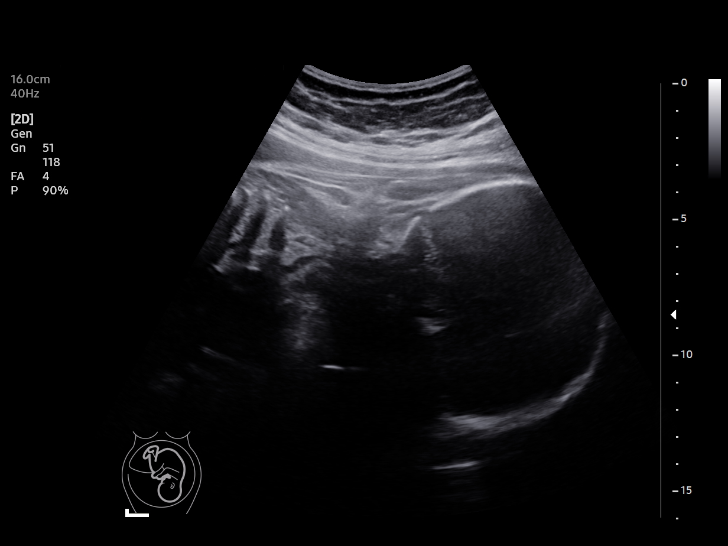
[im 2/15]
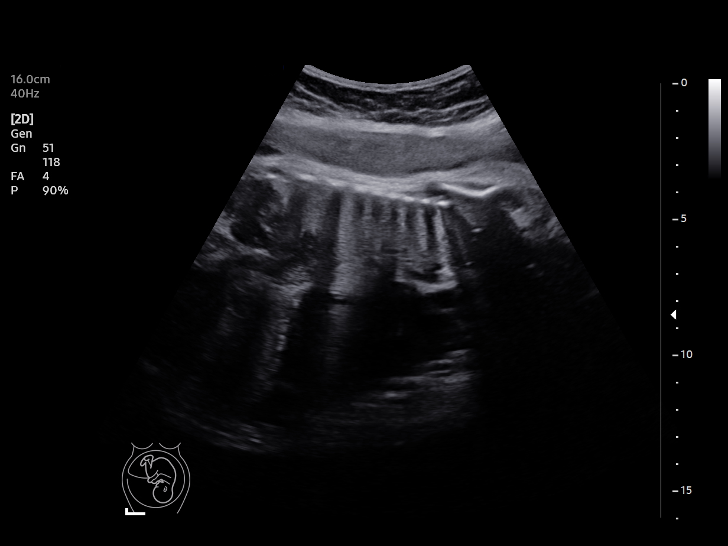
[im 3/15]
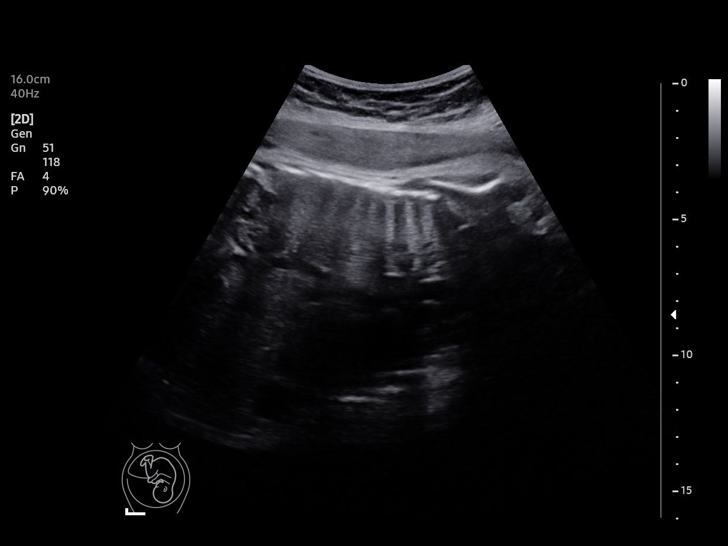
[im 4/15]
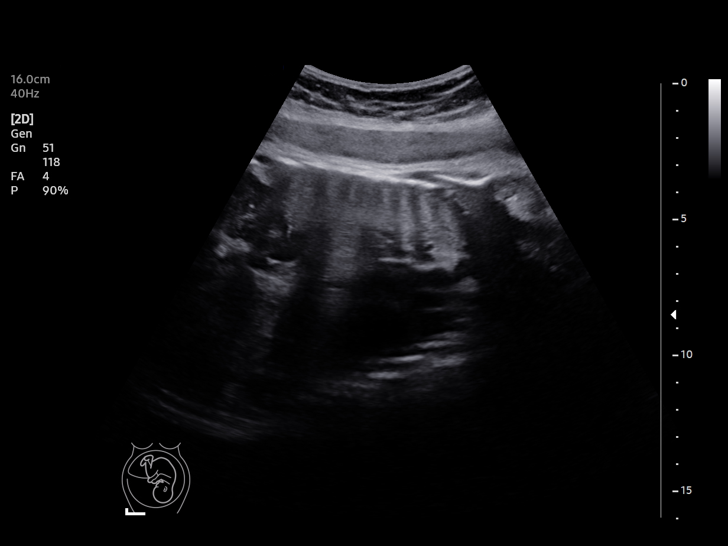
[im 5/15]
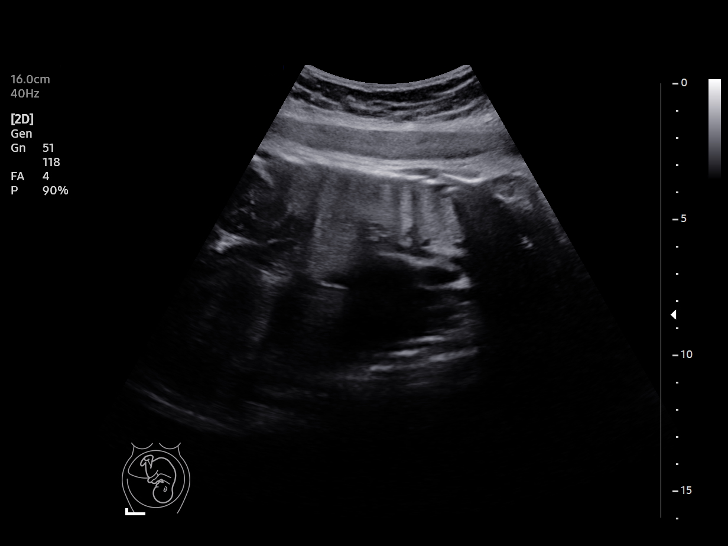
[im 6/15]
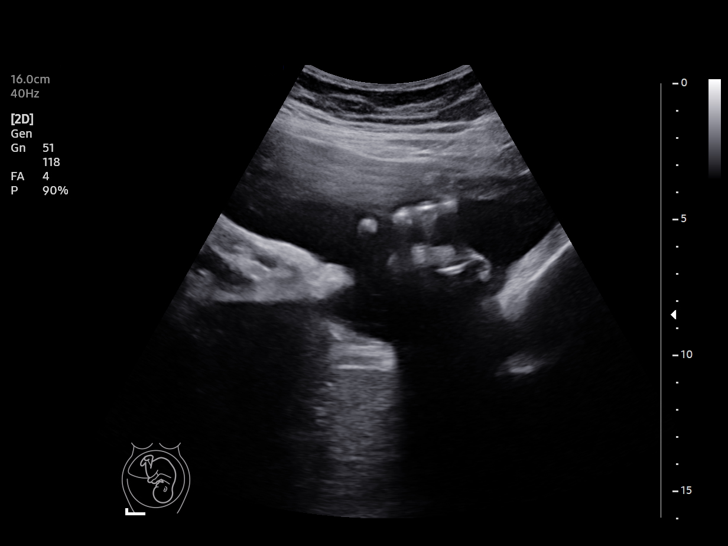
[im 7/15]
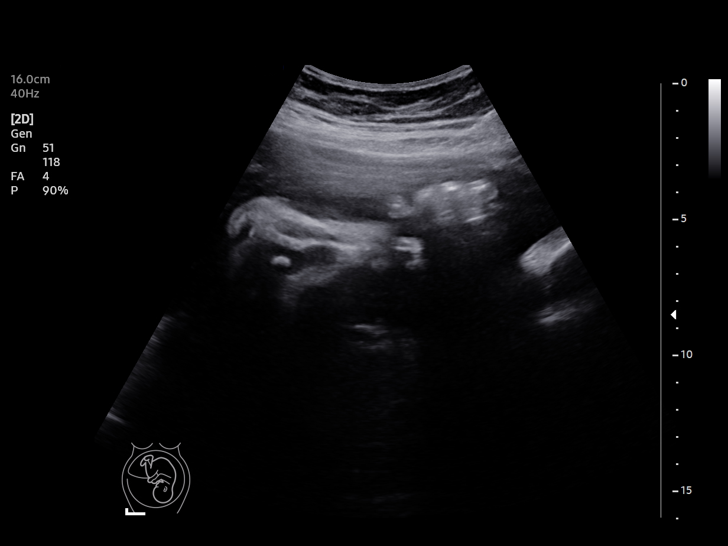
[im 9/15]
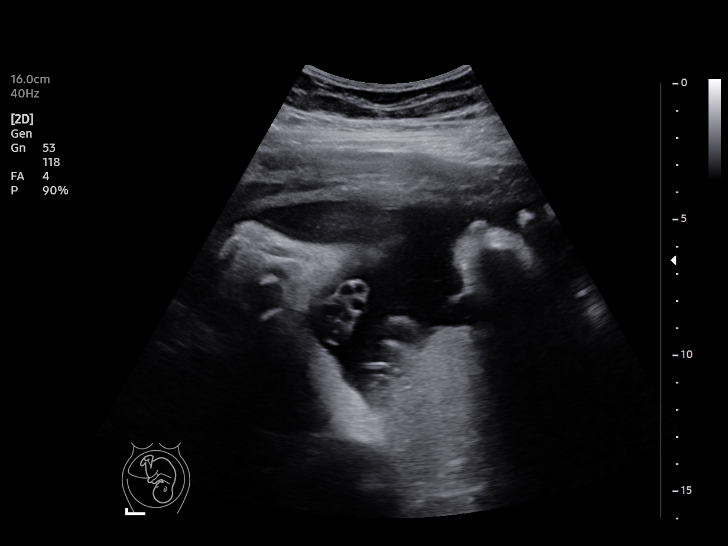
[im 10/15]
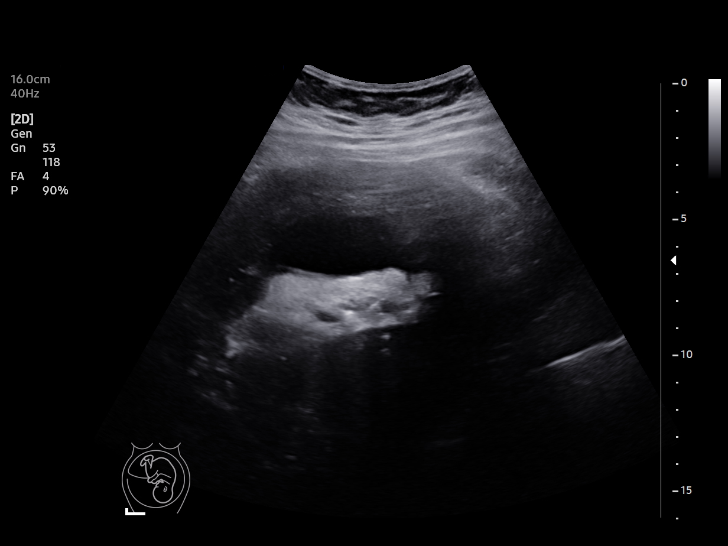
[im 11/15]
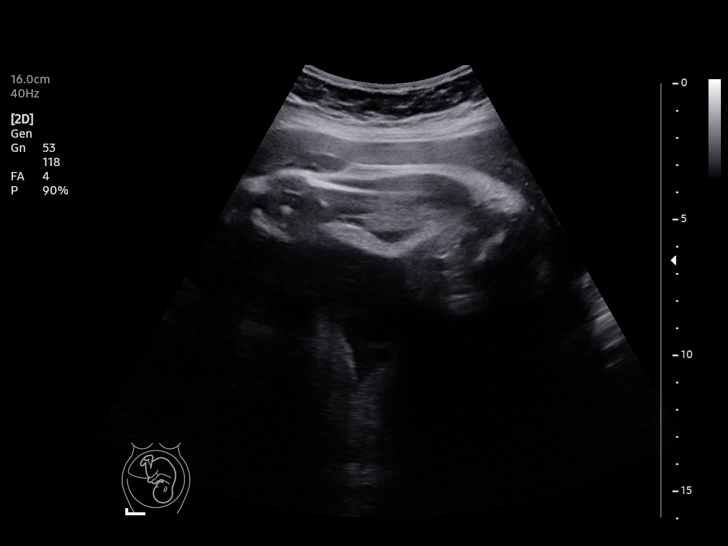
[im 12/15]
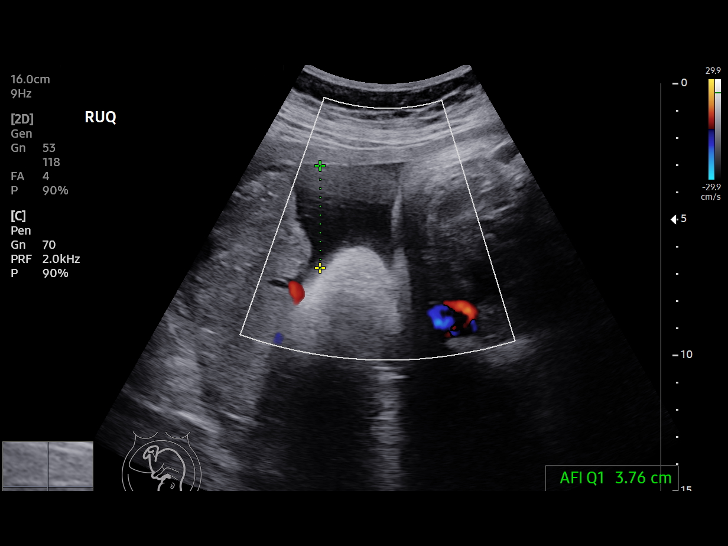
[im 13/15]
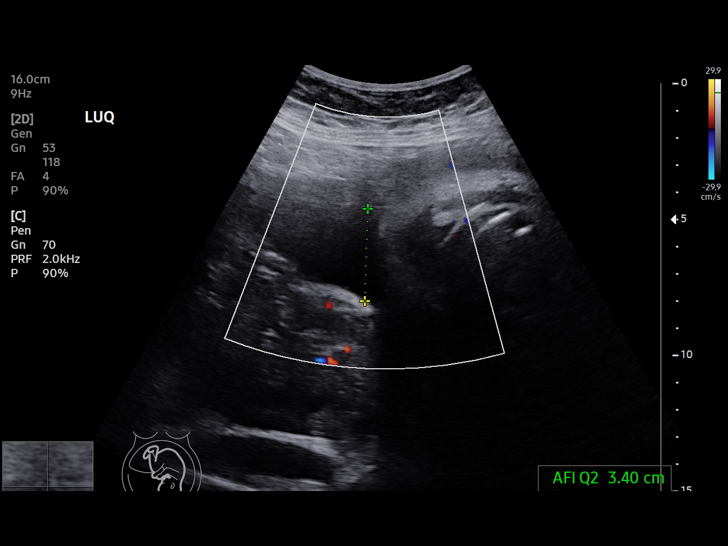
[im 14/15]
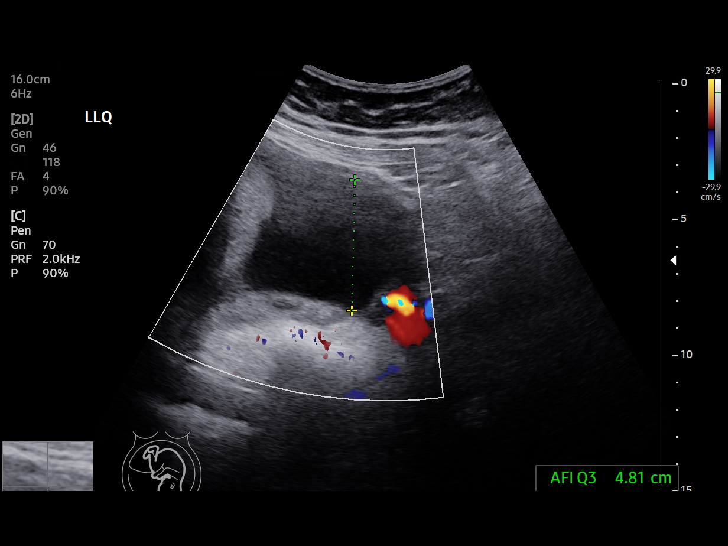
[im 15/15]
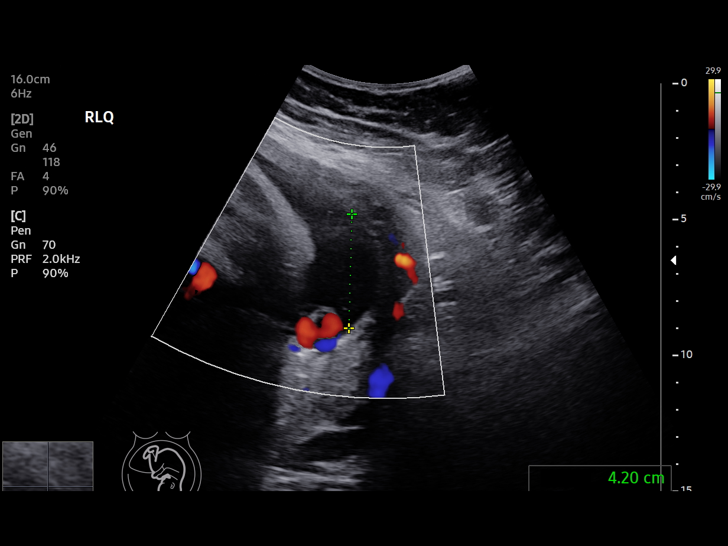

[14 of 15 positions shown; findings below may reference images not displayed]

[REDACTED]care at

 1  US FETAL BPP W/NONSTRESS              76818.4     SAE-EOL

Service(s) Provided

Indications

 Weeks of gestation of pregnancy not
 specified
 Gestational diabetes in pregnancy,             [OR]
 controlled by oral hypoglycemic drugs
Fetal Evaluation

 Num Of Fetuses:         1
 Preg. Location:         Intrauterine
 Cardiac Activity:       Observed
 Presentation:           Cephalic

 Amniotic Fluid
 AFI FV:      Within normal limits

 AFI Sum(cm)                 Largest Pocket(cm)


 RUQ(cm)       RLQ(cm)       LUQ(cm)        LLQ(cm)

Biophysical Evaluation
 Amniotic F.V:   Pocket => 2 cm             F. Tone:        Observed
 F. Movement:    Observed                   N.S.T:          Reactive
 F. Breathing:   Observed                   Score:          [DATE]
Impression

 BPP [DATE]
Recommendations

 -Continue weekly BPP till delivery.
                  SAE-EOL

## 2019-10-06 NOTE — Progress Notes (Signed)
   PRENATAL VISIT NOTE  Subjective:  Krystal Glass is a 29 y.o. G3P1011 at [redacted]w[redacted]d being seen today for ongoing prenatal care.  She is currently monitored for the following issues for this high-risk pregnancy and has Obesity in pregnancy; Supervision of low-risk pregnancy; History of postpartum depression, currently pregnant; History of gestational diabetes mellitus (GDM); PCOS (polycystic ovarian syndrome); Anxiety; and Gestational diabetes mellitus on their problem list.  Patient reports no bleeding, no cramping, no leaking, occasional contractions and blood glucose stable on qHs dosing of metformin. .  Contractions: Irregular. Vag. Bleeding: None.  Movement: Present. Denies leaking of fluid.   The following portions of the patient's history were reviewed and updated as appropriate: allergies, current medications, past family history, past medical history, past social history, past surgical history and problem list.   Objective:   Vitals:   10/06/19 0842  BP: 112/62  Pulse: 92  Weight: 263 lb 4.8 oz (119.4 kg)    Fetal Status: Fetal Heart Rate (bpm): NST   Movement: Present     General:  Alert, oriented and cooperative. Patient is in no acute distress.  Skin: Skin is warm and dry. No rash noted.   Cardiovascular: Normal heart rate noted  Respiratory: Normal respiratory effort, no problems with respiration noted  Abdomen: Soft, gravid, appropriate for gestational age.  Pain/Pressure: Present     Pelvic: Cervical exam deferred        Extremities: Normal range of motion.  Edema: None  Mental Status: Normal mood and affect. Normal behavior. Normal judgment and thought content.   Assessment and Plan:  Pregnancy: G3P1011 at [redacted]w[redacted]d 1. Encounter for supervision of high risk pregnancy in third trimester, antepartum NST reactive, BPP/NST weekly.   2. Gestational diabetes mellitus (GDM) in third trimester controlled on oral hypoglycemic drug Continue metformin qHS, diet and blood glucose  goals reviewed.   Preterm labor symptoms and general obstetric precautions including but not limited to vaginal bleeding, contractions, leaking of fluid and fetal movement were reviewed in detail with the patient. Please refer to After Visit Summary for other counseling recommendations.   Return in about 1 week (around 10/13/2019) for weekly NST/BPP and HOB.  Future Appointments  Date Time Provider Department Center  10/12/2019  2:45 PM [facility] HEALTH CLINICIAN [facility]    Malachy Chamber, MD

## 2019-10-06 NOTE — BH Specialist Note (Signed)
Integrated Behavioral Health via Telemedicine Video (Caregility) Visit  10/06/2019 Krystal Glass [id number]  Number of Integrated Behavioral Health visits: 2 Session Start time: 2:50  Session End time: 3:05 Total time: 15 minutes  Referring Provider: Donia Ast, NP Type of Visit: Video Patient/Family location: Home [facility] Provider location: Center for [facility]  All persons participating in visit: Patient Krystal Glass and [facility] Krystal Glass    Discussed confidentiality:  at previous visit  I connected with [name]  by a video enabled telemedicine application (Caregility) and verified that I am speaking with the correct person using two identifiers.    I discussed that engaging in this virtual visit, they consent to the provision of behavioral healthcare and the services will be billed under their insurance.   Patient and/or legal guardian expressed understanding and consented to virtual visit: Yes   PRESENTING CONCERNS: Patient and/or family reports the following symptoms/concerns: Pt states her primary concern today is worry that baby will come when she is home alone, along with mixed feelings about greater need for rest.  Duration of problem: Current pregnancy; Severity of problem: mild  STRENGTHS (Protective Factors/Coping Skills): Adhering to treatment, good social support; using daily self-coping strategies  GOALS ADDRESSED: Patient will: 1.  Reduce symptoms of: anxiety, depression and stress  2.  Increase knowledge and/or ability of: healthy habits and stress reduction  3.  Demonstrate ability to: Increase healthy adjustment to current life circumstances  INTERVENTIONS: Interventions utilized:  Supportive Counseling and Psychoeducation and/or Health Education Standardized Assessments completed: Not Needed  ASSESSMENT: Patient currently experiencing Adjustment disorder with anxiety.   Patient may benefit from  continued psychoeducation and brief therapeutic interventions regarding coping with symptoms of anxiety, depression, stress .  PLAN: 1. Follow up with behavioral health clinician on : Postpartum 2. Behavioral recommendations:  -Continue taking prenatal vitamin daily -Continue taking Buspar and Vistaril as prescribed by medical provider -Continue using self-coping strategies that remain helpful -Continue pacing physical activity throughout the day(10-15 minutes at a time for houswork, etc.) with guilt-free times of physical and emotional rest ("mindless" phone games and TV) 3. Referral(s): Integrated [employer] (In Clinic)  I discussed the assessment and treatment plan with the patient and/or parent/guardian. They were provided an opportunity to ask questions and all were answered. They agreed with the plan and demonstrated an understanding of the instructions.   They were advised to call back or seek an in-person evaluation if the symptoms worsen or if the condition fails to improve as anticipated.   Confirmed patient's address: Yes  Confirmed patient's phone number: Yes  Any changes to demographics: No   Confirmed patient's insurance: Yes  Any changes to patient's insurance: No   I discussed the limitations of evaluation and management by telemedicine and the availability of in person appointments.  I discussed that the purpose of this visit is to provide behavioral health care while limiting exposure to the novel coronavirus.   Discussed there is a possibility of technology failure and discussed alternative modes of communication if that failure occurs.  Krystal Glass

## 2019-10-06 NOTE — Patient Instructions (Signed)
Gestational Diabetes Mellitus, Self Care When you have gestational diabetes (gestational diabetes mellitus), you must make sure your blood sugar (glucose) stays in a healthy range. You can do this with:  Nutrition.  Exercise.  Lifestyle changes.  Medicines or insulin, if needed.  Support from your doctors and others. If you get treated for this condition, it may not hurt you or your unborn baby (fetus). If you do not get treated for this condition, it may cause problems that can hurt you or your unborn baby. If you get gestational diabetes, you are:  More likely to get it if you get pregnant again.  More likely to develop type 2 diabetes in the future. How to stay aware of blood sugar   Check your blood sugar every day while you are pregnant. Check it as often as told.  Call your doctor if your blood sugar is above your goal numbers for two tests in a row. Your doctor will set personal treatment goals for you. Generally, you should have these blood sugar levels:  Before meals, or after not eating for a long time (fasting or preprandial): at or below 95 mg/dL (5.3 mmol/L).  After meals (postprandial): ? One hour after a meal: at or below 140 mg/dL (7.8 mmol/L). ? Two hours after a meal: at or below 120 mg/dL (6.7 mmol/L).  A1c (hemoglobin A1c) level: 6-6.5%. How to manage high and low blood sugar Signs of high blood sugar High blood sugar is called hyperglycemia. Know the early signs of high blood sugar. Signs may include:  Feeling: ? Thirsty. ? Hungry. ? Very tired.  Needing to pee (urinate) more than usual.  Blurry vision. Signs of low blood sugar Low blood sugar is called hypoglycemia. This is when blood sugar is at or below 70 mg/dL (3.9 mmol/L). Signs may include:  Feeling: ? Hungry. ? Worried or nervous (anxious). ? Sweaty and clammy. ? Confused. ? Dizzy. ? Sleepy. ? Sick to your stomach (nauseous).  Having: ? A fast heartbeat. ? A headache. ? A change  in your vision. ? Tingling or no feeling (numbness) around your mouth, lips, or tongue. ? Jerky movements that you cannot control (seizure).  Having trouble with: ? Moving (coordination). ? Sleeping. ? Passing out (fainting). ? Getting upset easily (irritability). Treating low blood sugar To treat low blood sugar, eat or drink something sugary right away. If you can think clearly and swallow safely, follow the 15:15 rule:  Take 15 grams of a fast-acting carb (carbohydrate). Talk with your doctor about how much you should take.  Some fast-acting carbs are: ? Sugar tablets (glucose pills). Take 3-4 glucose pills. ? 6-8 pieces of hard candy. ? 4-6 oz (120-150 mL) of fruit juice. ? 4-6 oz (120-150 mL) of regular (not diet) soda. ? 1 Tbsp (15 mL) honey or sugar.  Check your blood sugar 15 minutes after you take the carb.  If your blood sugar is still at or below 70 mg/dL (3.9 mmol/L), take 15 grams of a carb again.  If your blood sugar does not go above 70 mg/dL (3.9 mmol/L) after 3 tries, get help right away.  After your blood sugar goes back to normal, eat a meal or a snack within 1 hour. Treating very low blood sugar If your blood sugar is at or below 54 mg/dL (3 mmol/L), you have very low blood sugar (severe hypoglycemia). This is an emergency. Do not wait to see if the symptoms will go away. Get medical help right  away. Call your local emergency services (911 in the [place] If you have very low blood sugar and you cannot eat or drink, you may need a glucagon shot (injection). A family member or friend should learn how to check your blood sugar and how to give you a glucagon shot. Ask your doctor if you need to have a glucagon shot kit at home. Follow these instructions at home: Medicine  Take your insulin and diabetes medicines as told.  If your doctor says you should take more or less insulin or medicines, do this exactly as told.  Do not run out of insulin or  medicines. Food   Make healthy food choices. These include: ? Chicken, fish, egg whites, and beans. ? Oats, whole wheat, bulgur, brown rice, quinoa, and millet. ? Fresh fruits and vegetables. ? Low-fat dairy products. ? Nuts, avocado, olive oil, and canola oil.  Meet with a food specialist (dietitian). He or she can help you make an eating plan that is right for you.  Follow instructions from your doctor about what you cannot eat or drink.  Drink enough fluid to keep your pee (urine) pale yellow.  Eat healthy snacks between healthy meals.  Keep track of carbs that you eat. Do this by reading food labels and learning food serving sizes.  Follow your sick day plan when you cannot eat or drink normally. Make this plan with your doctor so it is ready to use. Activity  Exercise for 30 or more minutes a day, or as much as your doctor recommends.  Talk with your doctor before you start a new exercise or activity. Your doctor may need to tell you to change: ? How much insulin or medicines you take. ? How much food you eat. Lifestyle  Do not drink alcohol.  Do not use any tobacco products. These include cigarettes, chewing tobacco, and e-cigarettes. If you need help quitting, ask your doctor.  Learn how to deal with stress. If you need help with this, ask your doctor. Body care  Stay up to date with your shots (immunizations).  Brush your teeth and gums two times a day. Floss one or more times a day.  Go to the dentist one or more times every 6 months.  Stay at a healthy weight while you are pregnant. General instructions  Take over-the-counter and prescription medicines only as told by your doctor.  Ask your doctor about risks of high blood pressure in pregnancy (preeclampsia and eclampsia).  Share your diabetes care plan with: ? Your work or school. ? People you live with.  Check your pee for ketones: ? When you are sick. ? As told by your doctor.  Carry a card or  wear jewelry that says you have diabetes.  Keep all follow-up visits as told by your doctor. This is important. Care after giving birth  Have your blood sugar checked 4-12 weeks after you give birth.  Get checked for diabetes one or more times during 3 years. Questions to ask your doctor  Do I need to meet with a diabetes educator?  Where can I find a support group for people with gestational diabetes? Where to find more information To learn more about diabetes, visit:  American Diabetes Association: www.diabetes.org  Centers for Disease Control and Prevention (CDC): www.cdc.gov Summary  Check your blood sugar (glucose) every day while you are pregnant. Check it as often as told.  Take your insulin and diabetes medicines as told.  Keep all follow-up visits as   told by your doctor. This is important.  Have your blood sugar checked 4-12 weeks after you give birth. This information is not intended to replace advice given to you by your health care provider. Make sure you discuss any questions you have with your health care provider. Document Revised: 07/21/2017 Document Reviewed: 03/03/2015 Elsevier Patient Education  2020 Elsevier Inc.  

## 2019-10-06 NOTE — Progress Notes (Signed)
Pt reports some increased blood sugars. She also has more frequent amount of UC's.

## 2019-10-09 LAB — URINE CULTURE, OB REFLEX

## 2019-10-09 LAB — CULTURE, OB URINE

## 2019-10-12 ENCOUNTER — Other Ambulatory Visit: Payer: Self-pay

## 2019-10-12 ENCOUNTER — Ambulatory Visit (INDEPENDENT_AMBULATORY_CARE_PROVIDER_SITE_OTHER): Payer: Self-pay | Admitting: Clinical

## 2019-10-12 DIAGNOSIS — F4322 Adjustment disorder with anxiety: Secondary | ICD-10-CM

## 2019-10-13 ENCOUNTER — Ambulatory Visit (INDEPENDENT_AMBULATORY_CARE_PROVIDER_SITE_OTHER): Payer: Self-pay

## 2019-10-13 ENCOUNTER — Other Ambulatory Visit (HOSPITAL_COMMUNITY)
Admission: RE | Admit: 2019-10-13 | Discharge: 2019-10-13 | Disposition: A | Discharge status: Home / Self Care | Payer: Self-pay | Source: Ambulatory Visit | Attending: Obstetrics & Gynecology | Admitting: Obstetrics & Gynecology

## 2019-10-13 ENCOUNTER — Ambulatory Visit (INDEPENDENT_AMBULATORY_CARE_PROVIDER_SITE_OTHER): Payer: Self-pay | Admitting: *Deleted

## 2019-10-13 ENCOUNTER — Ambulatory Visit (INDEPENDENT_AMBULATORY_CARE_PROVIDER_SITE_OTHER): Payer: Self-pay | Admitting: Obstetrics & Gynecology

## 2019-10-13 ENCOUNTER — Other Ambulatory Visit: Payer: Self-pay

## 2019-10-13 VITALS — BP 126/69 | HR 105 | Wt 261.6 lb

## 2019-10-13 DIAGNOSIS — O26893 Other specified pregnancy related conditions, third trimester: Secondary | ICD-10-CM

## 2019-10-13 DIAGNOSIS — O0993 Supervision of high risk pregnancy, unspecified, third trimester: Secondary | ICD-10-CM | POA: Insufficient documentation

## 2019-10-13 DIAGNOSIS — N898 Other specified noninflammatory disorders of vagina: Secondary | ICD-10-CM

## 2019-10-13 DIAGNOSIS — Z3A36 36 weeks gestation of pregnancy: Secondary | ICD-10-CM

## 2019-10-13 DIAGNOSIS — O24415 Gestational diabetes mellitus in pregnancy, controlled by oral hypoglycemic drugs: Secondary | ICD-10-CM

## 2019-10-13 NOTE — Patient Instructions (Signed)
Return to office for any scheduled appointments. Call the office or go to the MAU at [facility] if:  You begin to have strong, frequent contractions  Your water breaks.  Sometimes it is a big gush of fluid, sometimes it is just a trickle that keeps getting your panties wet or running down your legs  You have vaginal bleeding.  It is normal to have a small amount of spotting if your cervix was checked.   You do not feel your baby moving like normal.  If you do not, get something to eat and drink and lay down and focus on feeling your baby move.   If your baby is still not moving like normal, you should call the office or go to MAU.  Any other obstetric concerns.   

## 2019-10-13 NOTE — Progress Notes (Signed)
   PRENATAL VISIT NOTE  Subjective:  Krystal Glass is a 29 y.o. G3P1011 at [redacted]w[redacted]d being seen today for ongoing prenatal care.  She is currently monitored for the following issues for this high-risk pregnancy and has Obesity in pregnancy; Supervision of high-risk pregnancy, third trimester; History of postpartum depression, currently pregnant; PCOS (polycystic ovarian syndrome); Anxiety; and Gestational diabetes mellitus on their problem list.  Patient reports wet spots in her panties every morning x4 days and also at various times throughout the day. Reports clear liquid in the mornings.  Of note, AFI last week was 16.1 cm, is 10.8 cm today.  No vulvar irritation.  Contractions: Irregular. Vag. Bleeding: None.  Movement: Present. Denies leaking of fluid.   The following portions of the patient's history were reviewed and updated as appropriate: allergies, current medications, past family history, past medical history, past social history, past surgical history and problem list.   Objective:   Vitals:   10/13/19 0928  BP: 126/69  Pulse: (!) 105  Weight: 261 lb 9.6 oz (118.7 kg)    Fetal Status: Fetal Heart Rate (bpm): NST   Movement: Present  Presentation: Vertex  General:  Alert, oriented and cooperative. Patient is in no acute distress.  Skin: Skin is warm and dry. No rash noted.   Cardiovascular: Normal heart rate noted  Respiratory: Normal respiratory effort, no problems with respiration noted  Abdomen: Soft, gravid, appropriate for gestational age.  Pain/Pressure: Present     Pelvic: Copious thick, white-yellow, clumpy discharge seen. No overt fluid seen even after Valsalva. Fern test negative.  Dilation: 1 Effacement (%): Thick.  Exam performed in the presence of a chaperone.   Extremities: Normal range of motion.  Edema: Mild pitting, slight indentation  Mental Status: Normal mood and affect. Normal behavior. Normal judgment and thought content.    Assessment and Plan:   Pregnancy: G3P1011 at [redacted]w[redacted]d 1. Gestational diabetes mellitus (GDM) in third trimester controlled on oral hypoglycemic drug Reports normal range, fastings below 95, PP below 120. Did not bring log, told to bring next visit. Continue Metformin as prescribed.  NST performed today was reviewed and was found to be reactive. Subsequent BPP performed today was also reviewed and was found to be 10/10. AFI was also normal. Continue recommended antenatal testing and prenatal care.  2. Vaginal discharge during pregnancy in third trimester No evidence of ROM during exam. She was told to go to [facility] MAU for Amnisure if symptoms continue for more definitive testing. Will follow up results of discharge analysis and manage accordingly. - Cervicovaginal ancillary only( [place])  3. [redacted] weeks gestation of pregnancy 4. Encounter for supervision of high risk pregnancy in third trimester, antepartum Pelvic cultures done today.  - Strep Gp B NAA Preterm labor symptoms and general obstetric precautions including but not limited to vaginal bleeding, contractions, leaking of fluid and fetal movement were reviewed in detail with the patient. Please refer to After Visit Summary for other counseling recommendations.   Return in about 1 week (around 10/20/2019) for weekly NST/BPP and HOB for next 2 weeks.  Future Appointments  Date Time Provider Department Center  10/13/2019 10:50 AM [facility]  10/20/2019 10:15 AM [facility] NST [facility]  10/27/2019  8:15 AM [facility]ST [facility]  10/27/2019  9:15 AM Premont Bing, MD [facility]    Jaynie Collins, MD

## 2019-10-14 LAB — CERVICOVAGINAL ANCILLARY ONLY
Bacterial Vaginitis (gardnerella): POSITIVE — AB
Candida Glabrata: NEGATIVE
Candida Vaginitis: POSITIVE — AB
Chlamydia: NEGATIVE
Comment: NEGATIVE
Comment: NEGATIVE
Comment: NEGATIVE
Comment: NEGATIVE
Comment: NEGATIVE
Comment: NORMAL
Neisseria Gonorrhea: NEGATIVE
Trichomonas: NEGATIVE

## 2019-10-14 MED ORDER — METRONIDAZOLE 500 MG PO TABS: 500.0000 mg | ORAL_TABLET | Freq: Two times a day (BID) | ORAL | 0 refills | Status: AC

## 2019-10-14 MED ORDER — TERCONAZOLE 0.8 % VA CREA: 1.0000 | TOPICAL_CREAM | Freq: Every day | VAGINAL | 0 refills | Status: DC

## 2019-10-14 NOTE — Addendum Note (Signed)
Addended by: Jaynie Collins A on: 10/14/2019 03:28 PM   Modules accepted: Orders

## 2019-10-15 LAB — STREP GP B NAA: Strep Gp B NAA: NEGATIVE

## 2019-10-20 ENCOUNTER — Ambulatory Visit (INDEPENDENT_AMBULATORY_CARE_PROVIDER_SITE_OTHER): Payer: Self-pay | Admitting: Medical

## 2019-10-20 ENCOUNTER — Other Ambulatory Visit: Payer: Self-pay

## 2019-10-20 ENCOUNTER — Encounter: Payer: Self-pay | Admitting: Medical

## 2019-10-20 VITALS — BP 104/59 | HR 75 | Wt 265.5 lb

## 2019-10-20 DIAGNOSIS — O99210 Obesity complicating pregnancy, unspecified trimester: Secondary | ICD-10-CM

## 2019-10-20 DIAGNOSIS — O24419 Gestational diabetes mellitus in pregnancy, unspecified control: Secondary | ICD-10-CM

## 2019-10-20 DIAGNOSIS — O0993 Supervision of high risk pregnancy, unspecified, third trimester: Secondary | ICD-10-CM

## 2019-10-20 NOTE — Patient Instructions (Signed)
Fetal Movement Counts Patient Name: ________________________________________________ Patient Due Date: ____________________ What is a fetal movement count?  A fetal movement count is the number of times that you feel your baby move during a certain amount of time. This may also be called a fetal kick count. A fetal movement count is recommended for every pregnant woman. You may be asked to start counting fetal movements as early as week 28 of your pregnancy. Pay attention to when your baby is most active. You may notice your baby's sleep and wake cycles. You may also notice things that make your baby move more. You should do a fetal movement count:  When your baby is normally most active.  At the same time each day. A good time to count movements is while you are resting, after having something to eat and drink. How do I count fetal movements? 1. Find a quiet, comfortable area. Sit, or lie down on your side. 2. Write down the date, the start time and stop time, and the number of movements that you felt between those two times. Take this information with you to your health care visits. 3. Write down your start time when you feel the first movement. 4. Count kicks, flutters, swishes, rolls, and jabs. You should feel at least 10 movements. 5. You may stop counting after you have felt 10 movements, or if you have been counting for 2 hours. Write down the stop time. 6. If you do not feel 10 movements in 2 hours, contact your health care provider for further instructions. Your health care provider may want to do additional tests to assess your baby's well-being. Contact a health care provider if:  You feel fewer than 10 movements in 2 hours.  Your baby is not moving like he or she usually does. Date: ____________ Start time: ____________ Stop time: ____________ Movements: ____________ Date: ____________ Start time: ____________ Stop time: ____________ Movements: ____________ Date: ____________  Start time: ____________ Stop time: ____________ Movements: ____________ Date: ____________ Start time: ____________ Stop time: ____________ Movements: ____________ Date: ____________ Start time: ____________ Stop time: ____________ Movements: ____________ Date: ____________ Start time: ____________ Stop time: ____________ Movements: ____________ Date: ____________ Start time: ____________ Stop time: ____________ Movements: ____________ Date: ____________ Start time: ____________ Stop time: ____________ Movements: ____________ Date: ____________ Start time: ____________ Stop time: ____________ Movements: ____________ This information is not intended to replace advice given to you by your health care provider. Make sure you discuss any questions you have with your health care provider. Document Revised: 09/17/2018 Document Reviewed: 09/17/2018 Elsevier Patient Education  2020 [employer] Braxton Hicks Contractions Contractions of the uterus can occur throughout pregnancy, but they are not always a sign that you are in labor. You may have practice contractions called Braxton Hicks contractions. These false labor contractions are sometimes confused with true labor. What are Braxton Hicks contractions? Braxton Hicks contractions are tightening movements that occur in the muscles of the uterus before labor. Unlike true labor contractions, these contractions do not result in opening (dilation) and thinning of the cervix. Toward the end of pregnancy (32-34 weeks), Braxton Hicks contractions can happen more often and may become stronger. These contractions are sometimes difficult to tell apart from true labor because they can be very uncomfortable. You should not feel embarrassed if you go to the hospital with false labor. Sometimes, the only way to tell if you are in true labor is for your health care provider to look for changes in the cervix. The health care provider   will do a physical exam and may  monitor your contractions. If you are not in true labor, the exam should show that your cervix is not dilating and your water has not broken. If there are no other health problems associated with your pregnancy, it is completely safe for you to be sent home with false labor. You may continue to have Braxton Hicks contractions until you go into true labor. How to tell the difference between true labor and false labor True labor  Contractions last 30-70 seconds.  Contractions become very regular.  Discomfort is usually felt in the top of the uterus, and it spreads to the lower abdomen and low back.  Contractions do not go away with walking.  Contractions usually become more intense and increase in frequency.  The cervix dilates and gets thinner. False labor  Contractions are usually shorter and not as strong as true labor contractions.  Contractions are usually irregular.  Contractions are often felt in the front of the lower abdomen and in the groin.  Contractions may go away when you walk around or change positions while lying down.  Contractions get weaker and are shorter-lasting as time goes on.  The cervix usually does not dilate or become thin. Follow these instructions at home:   Take over-the-counter and prescription medicines only as told by your health care provider.  Keep up with your usual exercises and follow other instructions from your health care provider.  Eat and drink lightly if you think you are going into labor.  If Braxton Hicks contractions are making you uncomfortable: ? Change your position from lying down or resting to walking, or change from walking to resting. ? Sit and rest in a tub of warm water. ? Drink enough fluid to keep your urine pale yellow. Dehydration may cause these contractions. ? Do slow and deep breathing several times an hour.  Keep all follow-up prenatal visits as told by your health care provider. This is important. Contact a  health care provider if:  You have a fever.  You have continuous pain in your abdomen. Get help right away if:  Your contractions become stronger, more regular, and closer together.  You have fluid leaking or gushing from your vagina.  You pass blood-tinged mucus (bloody show).  You have bleeding from your vagina.  You have low back pain that you never had before.  You feel your baby's head pushing down and causing pelvic pressure.  Your baby is not moving inside you as much as it used to. Summary  Contractions that occur before labor are called Braxton Hicks contractions, false labor, or practice contractions.  Braxton Hicks contractions are usually shorter, weaker, farther apart, and less regular than true labor contractions. True labor contractions usually become progressively stronger and regular, and they become more frequent.  Manage discomfort from Braxton Hicks contractions by changing position, resting in a warm bath, drinking plenty of water, or practicing deep breathing. This information is not intended to replace advice given to you by your health care provider. Make sure you discuss any questions you have with your health care provider. Document Revised: 01/10/2017 Document Reviewed: 06/13/2016 Elsevier Patient Education  2020 Elsevier Inc.  

## 2019-10-20 NOTE — Progress Notes (Signed)
   PRENATAL VISIT NOTE  Subjective:  Krystal Glass is a 29 y.o. G3P1011 at [redacted]w[redacted]d being seen today for ongoing prenatal care.  She is currently monitored for the following issues for this high-risk pregnancy and has Obesity in pregnancy; Supervision of high-risk pregnancy, third trimester; History of postpartum depression, currently pregnant; PCOS (polycystic ovarian syndrome); Anxiety; and Gestational diabetes mellitus on their problem list.  Patient reports occasional contractions and continued discharge.  Contractions: Irregular. Vag. Bleeding: None.  Movement: Present. Denies leaking of fluid.   The following portions of the patient's history were reviewed and updated as appropriate: allergies, current medications, past family history, past medical history, past social history, past surgical history and problem list.   Objective:   Vitals:   10/20/19 1052  BP: (!) 104/59  Pulse: 75  Weight: 265 lb 8 oz (120.4 kg)    Fetal Status: Fetal Heart Rate (bpm): 145 Fundal Height: 40 cm Movement: Present     General:  Alert, oriented and cooperative. Patient is in no acute distress.  Skin: Skin is warm and dry. No rash noted.   Cardiovascular: Normal heart rate noted  Respiratory: Normal respiratory effort, no problems with respiration noted  Abdomen: Soft, gravid, appropriate for gestational age.  Pain/Pressure: Present     Pelvic: Cervical exam deferred        Extremities: Normal range of motion.  Edema: Trace  Mental Status: Normal mood and affect. Normal behavior. Normal judgment and thought content.   Assessment and Plan:  Pregnancy: G3P1011 at [redacted]w[redacted]d 1. Supervision of high-risk pregnancy, third trimester - Doing well  2. Gestational diabetes mellitus (GDM) in third trimester, gestational diabetes method of control unspecified - CBG log reviewed, all but one value WNL, patient not logging QID, states not feeling well enough for meals TID - Fasting CBGs 90-94 daily  - Unable to  perform NST, BPP today due to power outage, patient to return on Friday AM for testing  - Plan to schedule IOL for 39 weeks, orders entered   3. Obesity in pregnancy  Term labor symptoms and general obstetric precautions including but not limited to vaginal bleeding, contractions, leaking of fluid and fetal movement were reviewed in detail with the patient. Please refer to After Visit Summary for other counseling recommendations.   Return in about 1 week (around 10/27/2019) for [facility] APP, In-Person, NST/BPP.  Future Appointments  Date Time Provider Department Center  10/27/2019  8:15 AM [facility] NST [facility]  10/27/2019  9:15 AM [place] Bing, MD [facility]    Vonzella Nipple, PA-C

## 2019-10-21 ENCOUNTER — Encounter: Payer: Self-pay | Admitting: General Practice

## 2019-10-22 ENCOUNTER — Ambulatory Visit (INDEPENDENT_AMBULATORY_CARE_PROVIDER_SITE_OTHER): Payer: Self-pay | Admitting: *Deleted

## 2019-10-22 ENCOUNTER — Telehealth: Payer: Self-pay | Admitting: Advanced Practice Midwife

## 2019-10-22 ENCOUNTER — Other Ambulatory Visit: Payer: Self-pay

## 2019-10-22 ENCOUNTER — Ambulatory Visit (INDEPENDENT_AMBULATORY_CARE_PROVIDER_SITE_OTHER): Payer: Self-pay

## 2019-10-22 VITALS — BP 120/70 | HR 97 | Wt 265.0 lb

## 2019-10-22 DIAGNOSIS — O24419 Gestational diabetes mellitus in pregnancy, unspecified control: Secondary | ICD-10-CM

## 2019-10-22 NOTE — Telephone Encounter (Signed)
-----   Message from Drucilla Schmidt Day, RN sent at 10/22/2019 12:57 PM EDT ----- Regarding: RE: Call pt re: waterbirth Today was the first time I heard from her that she was interested in waterbirth. It also was not on her sticky note, so I don't think she has discussed it with anyone. She has been having fetal testing for weeks and I have not overheard any discussion with providers about waterbirth during that time. Today she was not scheduled to see a provider. Thank you for taking the time to call her.  ----- Message ----- From: Hurshel Party, CNM Sent: 10/22/2019  12:47 PM EDT To: Drucilla Schmidt Day, RN Subject: RE: Call pt re: waterbirth                     Diane,  Yes, per the newest guidelines, diabetes on medications is a contraindication for waterbirth.  I will call the patient and talk with her.  In the future, I would prefer she get scheduled at the office to see midwives if she plans a waterbirth, even if she has diabetes. The midwives might have talked with her weeks ago, told her she couldn't have a waterbirth, and then she could see doctors for her high risk status.  I am just trying to make this as efficient as possible, but I don't mind calling this patient.    Thanks so much.  Misty Stanley  ----- Message ----- From: Jill Side, RN Sent: 10/22/2019   9:04 AM EDT To: Hurshel Party, CNM Subject: Call pt re: waterbirth                         Would you mind calling this pt and discussing waterbirth?  She is scheduled for IOL on 9/20 and has attended the class. I have informed her that she most likely will not be able to have waterbirth due to being HR - and especially if she is being induced - as opposed to spontaneous labor. Anyway, I felt that you would be better able to answer her questions and provide information regarding our practices.  Thanks, Diane

## 2019-10-22 NOTE — Telephone Encounter (Signed)
Called patient at request of [facility] office because she reported an interest in waterbirth and has taken the class but has GDM on metformin.  GDM on metformin is contraindication to waterbirth and I called the patient today to discuss. Pt states understanding.  Pt wants to be midwife preferred and we will support her to have labor as low intervention as possible.

## 2019-10-26 ENCOUNTER — Telehealth (HOSPITAL_COMMUNITY): Payer: Self-pay | Admitting: *Deleted

## 2019-10-26 ENCOUNTER — Encounter (HOSPITAL_COMMUNITY): Payer: Self-pay | Admitting: *Deleted

## 2019-10-26 NOTE — Telephone Encounter (Signed)
Preadmission screen  

## 2019-10-27 ENCOUNTER — Other Ambulatory Visit: Payer: Self-pay

## 2019-10-27 ENCOUNTER — Ambulatory Visit (INDEPENDENT_AMBULATORY_CARE_PROVIDER_SITE_OTHER): Payer: Self-pay | Admitting: *Deleted

## 2019-10-27 ENCOUNTER — Ambulatory Visit (INDEPENDENT_AMBULATORY_CARE_PROVIDER_SITE_OTHER): Payer: Self-pay | Admitting: Obstetrics and Gynecology

## 2019-10-27 ENCOUNTER — Ambulatory Visit (INDEPENDENT_AMBULATORY_CARE_PROVIDER_SITE_OTHER): Payer: Self-pay

## 2019-10-27 VITALS — BP 121/68 | HR 108 | Wt 264.6 lb

## 2019-10-27 DIAGNOSIS — O24415 Gestational diabetes mellitus in pregnancy, controlled by oral hypoglycemic drugs: Secondary | ICD-10-CM

## 2019-10-27 DIAGNOSIS — O3663X0 Maternal care for excessive fetal growth, third trimester, not applicable or unspecified: Secondary | ICD-10-CM

## 2019-10-27 DIAGNOSIS — Z3A38 38 weeks gestation of pregnancy: Secondary | ICD-10-CM

## 2019-10-27 DIAGNOSIS — O0993 Supervision of high risk pregnancy, unspecified, third trimester: Secondary | ICD-10-CM

## 2019-10-27 DIAGNOSIS — O99210 Obesity complicating pregnancy, unspecified trimester: Secondary | ICD-10-CM

## 2019-10-27 NOTE — Progress Notes (Signed)
Pt requests refill of Hydroxyzine. She reports increased pelvic pressure and UC's.  IOL scheduled on 9/20

## 2019-10-27 NOTE — Progress Notes (Signed)

## 2019-10-27 NOTE — Progress Notes (Signed)
Scheduled MFM U/S for 10/28/19 @ 1130.  Pt notified.    Addison Naegeli, RN  10/27/19

## 2019-10-27 NOTE — Progress Notes (Signed)
Prenatal Visit Note Date: 10/27/2019 Clinic: [facility]  Subjective:  Krystal Glass is a 29 y.o. G3P1011 at [redacted]w[redacted]d being seen today for ongoing prenatal care.  She is currently monitored for the following issues for this high-risk pregnancy and has Obesity in pregnancy; Supervision of high-risk pregnancy, third trimester; History of postpartum depression, currently pregnant; PCOS (polycystic ovarian syndrome); Anxiety; Gestational diabetes mellitus; and Excessive fetal growth affecting management of mother in third trimester, antepartum on their problem list.  Patient reports no complaints.   Contractions: Irregular. Vag. Bleeding: None.  Movement: Present. Denies leaking of fluid.   The following portions of the patient's history were reviewed and updated as appropriate: allergies, current medications, past family history, past medical history, past social history, past surgical history and problem list. Problem list updated.  Objective:   Vitals:   10/27/19 0836  BP: 121/68  Pulse: (!) 108  Weight: 264 lb 9.6 oz (120 kg)    Fetal Status:     Movement: Present     General:  Alert, oriented and cooperative. Patient is in no acute distress.  Skin: Skin is warm and dry. No rash noted.   Cardiovascular: Normal heart rate noted  Respiratory: Normal respiratory effort, no problems with respiration noted  Abdomen: Soft, gravid, appropriate for gestational age. Pain/Pressure: Present     Pelvic:  Cervical exam deferred        Extremities: Normal range of motion.     Mental Status: Normal mood and affect. Normal behavior. Normal judgment and thought content.   Urinalysis:      Assessment and Plan:  Pregnancy: G3P1011 at [redacted]w[redacted]d  1. Supervision of high-risk pregnancy, third trimester Routine care. GBS neg  2. Gestational diabetes mellitus (GDM) in third trimester controlled on oral hypoglycemic drug On metformin 500mg  qhs. Normal BS long on baby scripts. Pt states  her last u/s was at [facility] in July and she doesn't have one scheduled. On that one on 7/29, she was on LGA with afi 19, efw >97%, 2535gm, ac 3wks ahead and anatomy normal.  First baby was 7lbs 23 inches and she states this one feels bigger - 8/29 MFM OB DETAIL +14 WK; Future  3. Obesity in pregnancy  4. Excessive fetal growth affecting management of pregnancy in third trimester, single or unspecified fetus Set up for u/s this week and MD visit.  - [place] MFM OB DETAIL +14 WK; Future  Term labor symptoms and general obstetric precautions including but not limited to vaginal bleeding, contractions, leaking of fluid and fetal movement were reviewed in detail with the patient. Please refer to After Visit Summary for other counseling recommendations.  Return in about 1 day (around 10/28/2019).   10/30/2019, MD

## 2019-10-28 ENCOUNTER — Ambulatory Visit: Payer: Self-pay | Attending: Obstetrics and Gynecology

## 2019-10-28 ENCOUNTER — Other Ambulatory Visit (HOSPITAL_COMMUNITY): Payer: Self-pay | Admitting: Advanced Practice Midwife

## 2019-10-28 ENCOUNTER — Telehealth: Payer: Self-pay | Admitting: Obstetrics and Gynecology

## 2019-10-28 ENCOUNTER — Encounter: Payer: Self-pay | Admitting: Obstetrics and Gynecology

## 2019-10-28 ENCOUNTER — Ambulatory Visit: Payer: Self-pay | Admitting: *Deleted

## 2019-10-28 DIAGNOSIS — O24419 Gestational diabetes mellitus in pregnancy, unspecified control: Secondary | ICD-10-CM

## 2019-10-28 DIAGNOSIS — E282 Polycystic ovarian syndrome: Secondary | ICD-10-CM | POA: Insufficient documentation

## 2019-10-28 DIAGNOSIS — O99891 Other specified diseases and conditions complicating pregnancy: Secondary | ICD-10-CM | POA: Insufficient documentation

## 2019-10-28 DIAGNOSIS — Z8659 Personal history of other mental and behavioral disorders: Secondary | ICD-10-CM

## 2019-10-28 DIAGNOSIS — O0993 Supervision of high risk pregnancy, unspecified, third trimester: Secondary | ICD-10-CM | POA: Diagnosis present

## 2019-10-28 DIAGNOSIS — O3663X0 Maternal care for excessive fetal growth, third trimester, not applicable or unspecified: Secondary | ICD-10-CM | POA: Diagnosis present

## 2019-10-28 DIAGNOSIS — O24415 Gestational diabetes mellitus in pregnancy, controlled by oral hypoglycemic drugs: Secondary | ICD-10-CM

## 2019-10-28 NOTE — Telephone Encounter (Signed)
Spoke with patient about her missed appointment. Explained to her Dr. Vergie Living wanted her to see a provider after her ultrasound with MFM. She stated she did see a provider. I tried to explain to her she was suppose to come to this office after her ultrasound. She said the MFM provider said she could leave.

## 2019-10-29 ENCOUNTER — Encounter: Payer: Self-pay | Admitting: Obstetrics and Gynecology

## 2019-10-29 DIAGNOSIS — O283 Abnormal ultrasonic finding on antenatal screening of mother: Secondary | ICD-10-CM | POA: Insufficient documentation

## 2019-10-30 ENCOUNTER — Other Ambulatory Visit (HOSPITAL_COMMUNITY): Admission: RE | Admit: 2019-10-30 | Payer: Self-pay | Source: Ambulatory Visit

## 2019-11-01 ENCOUNTER — Other Ambulatory Visit: Payer: Self-pay

## 2019-11-01 ENCOUNTER — Inpatient Hospital Stay (HOSPITAL_COMMUNITY)
Admission: AD | Admit: 2019-11-01 | Discharge: 2019-11-03 | DRG: 807 | Disposition: A | Discharge status: Home / Self Care | Payer: Medicaid Other | Attending: Obstetrics & Gynecology | Admitting: Obstetrics & Gynecology

## 2019-11-01 ENCOUNTER — Inpatient Hospital Stay (HOSPITAL_COMMUNITY): Payer: Medicaid Other

## 2019-11-01 ENCOUNTER — Encounter (HOSPITAL_COMMUNITY): Payer: Self-pay | Admitting: Family Medicine

## 2019-11-01 DIAGNOSIS — E669 Obesity, unspecified: Secondary | ICD-10-CM | POA: Diagnosis present

## 2019-11-01 DIAGNOSIS — Z20822 Contact with and (suspected) exposure to covid-19: Secondary | ICD-10-CM | POA: Diagnosis present

## 2019-11-01 DIAGNOSIS — O99891 Other specified diseases and conditions complicating pregnancy: Secondary | ICD-10-CM

## 2019-11-01 DIAGNOSIS — O24425 Gestational diabetes mellitus in childbirth, controlled by oral hypoglycemic drugs: Secondary | ICD-10-CM | POA: Diagnosis present

## 2019-11-01 DIAGNOSIS — Z3A39 39 weeks gestation of pregnancy: Secondary | ICD-10-CM

## 2019-11-01 DIAGNOSIS — F419 Anxiety disorder, unspecified: Secondary | ICD-10-CM | POA: Diagnosis present

## 2019-11-01 DIAGNOSIS — O24435 Gestational diabetes mellitus in puerperium, controlled by oral hypoglycemic drugs: Secondary | ICD-10-CM | POA: Diagnosis not present

## 2019-11-01 DIAGNOSIS — O283 Abnormal ultrasonic finding on antenatal screening of mother: Secondary | ICD-10-CM | POA: Diagnosis present

## 2019-11-01 DIAGNOSIS — O99210 Obesity complicating pregnancy, unspecified trimester: Secondary | ICD-10-CM | POA: Diagnosis present

## 2019-11-01 DIAGNOSIS — O99345 Other mental disorders complicating the puerperium: Secondary | ICD-10-CM | POA: Diagnosis not present

## 2019-11-01 DIAGNOSIS — O99214 Obesity complicating childbirth: Secondary | ICD-10-CM | POA: Diagnosis present

## 2019-11-01 DIAGNOSIS — Z8659 Personal history of other mental and behavioral disorders: Secondary | ICD-10-CM

## 2019-11-01 DIAGNOSIS — O24419 Gestational diabetes mellitus in pregnancy, unspecified control: Secondary | ICD-10-CM | POA: Diagnosis present

## 2019-11-01 DIAGNOSIS — O99344 Other mental disorders complicating childbirth: Secondary | ICD-10-CM | POA: Diagnosis present

## 2019-11-01 DIAGNOSIS — O99215 Obesity complicating the puerperium: Secondary | ICD-10-CM | POA: Diagnosis not present

## 2019-11-01 LAB — CBC
HCT: 35.1 % — ABNORMAL LOW (ref 36.0–46.0)
Hemoglobin: 10.7 g/dL — ABNORMAL LOW (ref 12.0–15.0)
MCH: 25.4 pg — ABNORMAL LOW (ref 26.0–34.0)
MCHC: 30.5 g/dL (ref 30.0–36.0)
MCV: 83.2 fL (ref 80.0–100.0)
Platelets: 316 10*3/uL (ref 150–400)
RBC: 4.22 MIL/uL (ref 3.87–5.11)
RDW: 14.5 % (ref 11.5–15.5)
WBC: 6.6 10*3/uL (ref 4.0–10.5)
nRBC: 0 % (ref 0.0–0.2)

## 2019-11-01 LAB — GLUCOSE, CAPILLARY
Glucose-Capillary: 102 mg/dL — ABNORMAL HIGH (ref 70–99)
Glucose-Capillary: 92 mg/dL (ref 70–99)
Glucose-Capillary: 81 mg/dL (ref 70–99)

## 2019-11-01 LAB — TYPE AND SCREEN
ABO/RH(D): O POS
Antibody Screen: NEGATIVE

## 2019-11-01 LAB — SARS CORONAVIRUS 2 BY RT PCR (HOSPITAL ORDER, PERFORMED IN ~~LOC~~ HOSPITAL LAB): SARS Coronavirus 2: NEGATIVE

## 2019-11-01 MED ORDER — OXYCODONE-ACETAMINOPHEN 5-325 MG PO TABS: 2.0000 | ORAL_TABLET | ORAL | Status: DC | PRN

## 2019-11-01 MED ORDER — TERBUTALINE SULFATE 1 MG/ML IJ SOLN: 0.2500 mg | Freq: Once | INTRAMUSCULAR | Status: DC | PRN

## 2019-11-01 MED ORDER — ACETAMINOPHEN 325 MG PO TABS: 650.0000 mg | ORAL_TABLET | ORAL | Status: DC | PRN

## 2019-11-01 MED ORDER — OXYTOCIN 10 UNIT/ML IJ SOLN: 10.0000 [IU] | Freq: Once | INTRAMUSCULAR | Status: AC

## 2019-11-01 MED ORDER — BUSPIRONE HCL 5 MG PO TABS
15.0000 mg | ORAL_TABLET | Freq: Three times a day (TID) | ORAL | Status: DC
  Administered 2019-11-01 – 2019-11-03 (×6): 15 mg via ORAL
  Filled 2019-11-01 (×4): qty 3
  Filled 2019-11-01 (×2): qty 1
  Filled 2019-11-01: qty 3

## 2019-11-01 MED ORDER — OXYTOCIN BOLUS FROM INFUSION: 333.0000 mL | Freq: Once | INTRAVENOUS | Status: DC

## 2019-11-01 MED ORDER — LACTATED RINGERS IV SOLN: 500.0000 mL | INTRAVENOUS | Status: DC | PRN

## 2019-11-01 MED ORDER — OXYCODONE-ACETAMINOPHEN 5-325 MG PO TABS: 1.0000 | ORAL_TABLET | ORAL | Status: DC | PRN

## 2019-11-01 MED ORDER — MISOPROSTOL 25 MCG QUARTER TABLET
25.0000 ug | ORAL_TABLET | ORAL | Status: DC | PRN
  Administered 2019-11-01 (×2): 25 ug via VAGINAL
  Filled 2019-11-01 (×2): qty 1

## 2019-11-01 MED ORDER — OXYTOCIN-SODIUM CHLORIDE 30-0.9 UT/500ML-% IV SOLN
2.5000 [IU]/h | INTRAVENOUS | Status: DC
  Filled 2019-11-01: qty 500

## 2019-11-01 MED ORDER — LIDOCAINE HCL (PF) 1 % IJ SOLN: 30.0000 mL | INTRAMUSCULAR | Status: DC | PRN

## 2019-11-01 MED ORDER — ONDANSETRON HCL 4 MG/2ML IJ SOLN: 4.0000 mg | Freq: Four times a day (QID) | INTRAMUSCULAR | Status: DC | PRN

## 2019-11-01 MED ORDER — SOD CITRATE-CITRIC ACID 500-334 MG/5ML PO SOLN: 30.0000 mL | ORAL | Status: DC | PRN

## 2019-11-01 MED ORDER — HYDROXYZINE HCL 50 MG PO TABS
50.0000 mg | ORAL_TABLET | Freq: Every day | ORAL | Status: DC
  Administered 2019-11-01 – 2019-11-02 (×2): 50 mg via ORAL
  Filled 2019-11-01 (×2): qty 1

## 2019-11-01 MED ORDER — OXYTOCIN 10 UNIT/ML IJ SOLN
INTRAMUSCULAR | Status: AC
  Administered 2019-11-01: 10 [IU] via INTRAMUSCULAR
  Filled 2019-11-01: qty 1

## 2019-11-01 MED ORDER — LACTATED RINGERS IV SOLN: INTRAVENOUS | Status: DC

## 2019-11-01 NOTE — Progress Notes (Signed)
Pt not really feeling ctx, q 2-3 minutes on EFM. FHR Cat 1.  2nd cytotec given at 1835. Pt wishes to avoid pitocin if possible. Has SROM w/clear fluid a few minutes ago. Will reevaluate at 2230.

## 2019-11-01 NOTE — Discharge Summary (Signed)
Postpartum Discharge Summary    Patient Name: Krystal Glass DOB: January 13, 1991 MRN: [id number]  Date of admission: 11/01/2019 Delivery date:11/01/2019  Delivering provider: Christin Fudge  Date of discharge: 11/03/2019  Admitting diagnosis: GDM (gestational diabetes mellitus) [O24.419] Intrauterine pregnancy: [redacted]w[redacted]d    Secondary diagnosis:  Active Problems:   Obesity in pregnancy   History of postpartum depression, currently pregnant   Anxiety   Abnormal left fetal kidney on ultrasound   GDM (gestational diabetes mellitus)  Additional problems: precipitous delivery    Discharge diagnosis: Term Pregnancy Delivered                                              Post partum procedures:none Augmentation: Cytotec and IP Foley Complications: None  Hospital course: Induction of Labor With Vaginal Delivery   29y.o. yo G3P1011 at 330w1das admitted to the hospital 11/01/2019 for induction of labor.  Indication for induction: A2 DM.  Patient had an uncomplicated labor course as follows: Membrane Rupture Time/Date: 8:26 PM ,11/01/2019   Delivery Method:Vaginal, Spontaneous  Episiotomy: None  Lacerations:  None  Details of delivery can be found in separate delivery note.  Patient had a routine postpartum course. Patient is discharged home 11/03/19.  Newborn Data: Birth date:11/01/2019  Birth time:10:40 PM  Gender:Female  Living status:Living  Apgars:8 ,9  Weight:4026 g   Magnesium Sulfate received: No BMZ received: No Rhophylac:N/A MMR:N/A T-DaP:Given prenatally Flu: N/A Transfusion:No  Physical exam  Vitals:   11/02/19 1224 11/02/19 1401 11/02/19 2203 11/03/19 0550  BP: (!) 103/55 118/60 (!) 114/56 108/68  Pulse: 74 (!) 59 68 75  Resp: '17 18 18 19  ' Temp: 98.9 F (37.2 C) 98.2 F (36.8 C) 99 F (37.2 C) 98.4 F (36.9 C)  TempSrc: Oral Oral Oral Oral  SpO2: 100%  100% 100%  Weight:      Height:       General: alert, cooperative and no distress Lochia:  appropriate Uterine Fundus: firm Incision: Healing well with no significant drainage DVT Evaluation: No evidence of DVT seen on physical exam. Labs: Lab Results  Component Value Date   WBC 6.6 11/01/2019   HGB 10.7 (L) 11/01/2019   HCT 35.1 (L) 11/01/2019   MCV 83.2 11/01/2019   PLT 316 11/01/2019   CMP Latest Ref Rng & Units 10/12/2015  Glucose 65 - 99 mg/dL 101(H)  BUN 6 - 20 mg/dL 5(L)  Creatinine 0.44 - 1.00 mg/dL 0.62  Sodium 135 - 145 mmol/L 137  Potassium 3.5 - 5.1 mmol/L 3.7  Chloride 101 - 111 mmol/L 101  CO2 22 - 32 mmol/L 28  Calcium 8.9 - 10.3 mg/dL 8.7(L)  Total Protein 6.5 - 8.1 g/dL -  Total Bilirubin 0.3 - 1.2 mg/dL -  Alkaline Phos 38 - 126 U/L -  AST 15 - 41 U/L -  ALT 14 - 54 U/L -   Edinburgh Score: Edinburgh Postnatal Depression Scale Screening Tool 11/02/2019  I have been able to laugh and see the funny side of things. 0  I have looked forward with enjoyment to things. 0  I have blamed myself unnecessarily when things went wrong. 2  I have been anxious or worried for no good reason. 2  I have felt scared or panicky for no good reason. 0  Things have been getting on top of me. 1  I  have been so unhappy that I have had difficulty sleeping. 0  I have felt sad or miserable. 0  I have been so unhappy that I have been crying. 0  The thought of harming myself has occurred to me. 0  Edinburgh Postnatal Depression Scale Total 5     After visit meds:  Allergies as of 11/03/2019   No Known Allergies     Medication List    TAKE these medications   acetaminophen 325 MG tablet Commonly known as: Tylenol Take 2 tablets (650 mg total) by mouth every 4 (four) hours as needed (for pain scale < 4).   busPIRone 15 MG tablet Commonly known as: BUSPAR Take 1 tablet (15 mg total) by mouth 3 (three) times daily.   hydrOXYzine 50 MG capsule Commonly known as: Vistaril Take 1 capsule (50 mg total) by mouth at bedtime.   ibuprofen 600 MG tablet Commonly known  as: ADVIL Take 1 tablet (600 mg total) by mouth every 6 (six) hours.   metFORMIN 500 MG tablet Commonly known as: Glucophage Take 1 tablet (500 mg total) by mouth at bedtime.   prenatal vitamin w/FE, FA 27-1 MG Tabs tablet Take 1 tablet by mouth daily.        Discharge home in stable condition Infant Feeding: Bottle and Breast Infant Disposition:home with mother Discharge instruction: per After Visit Summary and Postpartum booklet. Activity: Advance as tolerated. Pelvic rest for 6 weeks.  Diet: routine diet Future Appointments: Future Appointments  Date Time Provider [place]  11/18/2019  2:15 PM [place] [facility]  11/30/2019  2:15 PM Jorje Guild, NP [facility]   Follow up Visit:   Please schedule this patient for a Virtual postpartum visit in 4 weeks with the following provider: Any provider. Additional Postpartum F/U:Postpartum Depression checkup  High risk pregnancy complicated by: GDM Delivery mode:  Vaginal, Spontaneous  Anticipated Birth Control:  vasectomy   11/03/2019 Janet Berlin, MD

## 2019-11-01 NOTE — H&P (Addendum)
OBSTETRIC ADMISSION HISTORY AND PHYSICAL  Krystal Glass is a 29 y.o. female G3P1011 with IUP at [redacted]w[redacted]d by 9wk sono presenting for IOL for GDMA2. She reports +FMs, No LOF, no VB, no blurry vision, headaches or peripheral edema, and RUQ pain.  She plans on breast feeding. Her husband is planning for vasectomy.  She received her prenatal care at [facility] .  Dating: By Lolita Patella --->  Estimated Date of Delivery: 11/07/19  Sono:    @[redacted]w[redacted]d , CWD, abnormal left fetal kidney on ultrasound, cephalic presentation, 3601g, EFW   Prenatal History/Complications:  GDMA2- on metformin 500mg  once daily Obesity in Pregnancy Anxiety, history of postpartum depression- on buspirone and hydroxyzine, notes symptoms have been well controlled this pregnancy   Past Medical History: Past Medical History:  Diagnosis Date  . 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 Closed displaced fracture of left clavicle 10/12/2015  . Closed displaced segmental fracture of shaft of left humerus 10/09/2015  . Gestational diabetes   . PCOS (polycystic ovarian syndrome)   . Postpartum depression     Past Surgical History: Past Surgical History:  Procedure Laterality Date  . ORIF HUMERUS FRACTURE Left 10/10/2015   Procedure: OPEN REDUCTION INTERNAL FIXATION (ORIF) LEFT HUMERUS AND CLAVICLE FRACTURE;  Surgeon: 10/11/2015, MD;  Location: [facility] OR;  Service: Orthopedics;  Laterality: Left;    Obstetrical History: OB History    Gravida  3   Para  1   Term  1   Preterm  0   AB  1   Living  1     SAB  1   TAB  0   Ectopic  0   Multiple  0   Live Births  1           Social History Social History   Socioeconomic History  . Marital status: Married    Spouse name: Not on file  . Number of children: Not on file  . Years of education: Not on file  . Highest education level: Not on file  Occupational History  . Not on file  Tobacco Use  . Smoking status: Never Smoker  . Smokeless  tobacco: Never Used  Vaping Use  . Vaping Use: Never used  Substance and Sexual Activity  . Alcohol use: No  . Drug use: No  . Sexual activity: Yes    Birth control/protection: None  Other Topics Concern  . Not on file  Social History Narrative  . Not on file   Social Determinants of Health   Financial Resource Strain:   . Difficulty of Paying Living Expenses: Not on file  Food Insecurity: No Food Insecurity  . Worried About Myrene Galas in the Last Year: Never true  . Ran Out of Food in the Last Year: Never true  Transportation Needs: No Transportation Needs  . Lack of Transportation (Medical): No  . Lack of Transportation (Non-Medical): No  Physical Activity:   . Days of Exercise per Week: Not on file  . Minutes of Exercise per Session: Not on file  Stress:   . Feeling of Stress : Not on file  Social Connections:   . Frequency of Communication with Friends and Family: Not on file  . Frequency of Social Gatherings with Friends and Family: Not on file  . Attends Religious Services: Not on file  . Active Member of Clubs or Organizations: Not on file  . Attends [facility] Meetings: Not on file  .  Marital Status: Not on file    Family History: Family History  Problem Relation Age of Onset  . Hypothyroidism Mother     Allergies: No Known Allergies  Medications Prior to Admission  Medication Sig Dispense Refill Last Dose  . busPIRone (BUSPAR) 15 MG tablet Take 1 tablet (15 mg total) by mouth 3 (three) times daily. 90 tablet 1   . hydrOXYzine (VISTARIL) 50 MG capsule Take 1 capsule (50 mg total) by mouth at bedtime. 30 capsule 0   . metFORMIN (GLUCOPHAGE) 500 MG tablet Take 1 tablet (500 mg total) by mouth at bedtime. 30 tablet 2   . prenatal vitamin w/FE, FA (PRENATAL 1 + 1) 27-1 MG TABS tablet Take 1 tablet by mouth daily. 30 each 6      Review of Systems   All systems reviewed and negative except as stated in HPI  Temperature 98.6 F (37 C),  temperature source Oral, resp. rate 20, height 5\' 4"  (1.626 m), weight 119.9 kg, last menstrual period 11/24/2018, currently breastfeeding. General appearance: alert, cooperative and no distress Lungs: normal respiratory effort Heart: regular rate and rhythm Abdomen: soft, non-tender Pelvic: 11/01/19 @ 1400 SVE 1-2/60/-3, soft Extremities: No edema, no sign of DVT Presentation: cephalic Fetal monitoringBaseline: 140 bpm, moderate variability, + accelerations, no decelerations Uterine activity: None     Prenatal labs: ABO, Rh: O/Positive/-- (03/25 02-07-1999) Antibody: Negative (03/25 0922) Rubella: 5.69 (03/25 0922) RPR: Non Reactive (07/09 0846)  HBsAg: Negative (03/25 02-07-1999)  HIV: Non Reactive (07/09 0846)  GBS: Negative/-- (09/01 0957)  2 hr Glucola- 113, 207, 144 - abnormal 2 high values Genetic screening  Low risk Anatomy 07-10-1970 done at 18wk2d, mild bilateral renal pelvis fullness noted and follow-up exam recommended  Prenatal Transfer Tool  Maternal Diabetes: Yes:  Diabetes Type:  Insulin/Medication controlled Genetic Screening: Normal Maternal Ultrasounds/Referrals: Fetal Kidney Anomalies and Fetal Heart Anomalies Fetal Ultrasounds or other Referrals:  Fetal echo Maternal Substance Abuse:  No Significant Maternal Medications: Metformin, buspirone, hydroxyzine Significant Maternal Lab Results: Group B Strep negative  No results found for this or any previous visit (from the past 24 hour(s)).  Patient Active Problem List   Diagnosis Date Noted  . GDM (gestational diabetes mellitus) 11/01/2019  . Abnormal left fetal kidney on ultrasound 10/29/2019  . Excessive fetal growth affecting management of mother in third trimester, antepartum 10/27/2019  . Gestational diabetes mellitus 08/21/2019  . Anxiety 08/20/2019  . Supervision of high-risk pregnancy, third trimester 04/22/2019  . History of postpartum depression, currently pregnant 04/22/2019  . PCOS (polycystic ovarian syndrome)    . Obesity in pregnancy 03/28/2017    Assessment/Plan:  Krystal Glass is a 29 y.o. G3P1011 at [redacted]w[redacted]d here for IOL for GDMA2.  #Labor: at 1400, SVE 1-2/60/-3, FB placed and cytotec 25 mcg placed, pt tolerated well. #Pain: Declines epidural #FWB: Cat I FHT #ID:  GBS neg #MOF: breast #MOC: vasectomy #Circ:  Declines #Obesity in Pregnancy #GDMA2: Holding home metformin, CBG q4hrs in latent labor and q2hrs in active labor. #Anxiety, history of PP depression: on buspirone and hydroxyzine, SW consult postpartum  07-25-1992, MD  11/01/2019, 1:17 PM  Attestation of Supervision:  I confirm that I have verified the information documented in the above note and that I have also personally reperformed the history, physical exam and all medical decision making activities.  I have verified that all services and findings are accurately documented in this student's note; and I agree with management and plan as outlined in the  documentation. I have also made any necessary editorial changes.  Buspar 15mg  TID and Vistaril 50mg  at bedtime ordered to continue while patient in labor. Patient has hx of PP depression will order SW consult after delivery and plan for patient to be seen in 1 week for mood check.   , CNM Center for , [facility] 11/01/2019 2:38 PM

## 2019-11-01 NOTE — Progress Notes (Signed)
Labor Progress Note Krystal Glass is a 29 y.o. G3P1011 at [redacted]w[redacted]d presented for IOL- GDMA2. S: Resting at bedside, doing well. Barely feeling contractions, + FM. Doing deep breathing, just ate a meal. Mother and husband Feliz Beam in room.  O:  BP (!) 107/58   Pulse 75   Temp 98.4 F (36.9 C) (Axillary)   Resp 18   Ht 5\' 4"  (1.626 m)   Wt 119.9 kg   LMP 11/24/2018   BMI 45.38 kg/m  EFM: baseline 140s, moderate variability, + accelerations, no decelerations Toco: contractions every 3-4 minutes, mild, not feeling strongly  CVE: Dilation: 5 Effacement (%): 60 Cervical Position: Posterior Station: -2 Presentation: Vertex Exam by:: Dr. [email or web address]    A&P: 29 y.o. 34 [redacted]w[redacted]d presenting for IOL for GDMA2. #Labor: Progressing well. S/p FB, cytotec [redacted]w[redacted]d PV x1, second dose of cytotec placed at 1830 for further cervical ripening. #Pain: well controlled, not planning for epidural #FWB: cat I, reassuring #GBS negative #GDMA2- most recent CBG 92, q4hr in latent labor, q2hr in active labor #Anxiety/h/o PP depression- continuing home medications, SW consult postpartum  , MD 6:29 PM

## 2019-11-01 NOTE — Progress Notes (Signed)
Nipple stimulation via breast pump started at 1652.  Will continue to monitor.   Johnathan Hausen RN

## 2019-11-02 ENCOUNTER — Encounter (HOSPITAL_COMMUNITY): Payer: Self-pay | Admitting: Family Medicine

## 2019-11-02 LAB — RPR: RPR Ser Ql: NONREACTIVE

## 2019-11-02 MED ORDER — METHYLERGONOVINE MALEATE 0.2 MG PO TABS: 0.2000 mg | ORAL_TABLET | ORAL | Status: DC | PRN

## 2019-11-02 MED ORDER — SIMETHICONE 80 MG PO CHEW: 80.0000 mg | CHEWABLE_TABLET | ORAL | Status: DC | PRN

## 2019-11-02 MED ORDER — DOCUSATE SODIUM 100 MG PO CAPS: 100.0000 mg | ORAL_CAPSULE | Freq: Two times a day (BID) | ORAL | Status: DC

## 2019-11-02 MED ORDER — FLEET ENEMA 7-19 GM/118ML RE ENEM: 1.0000 | ENEMA | Freq: Every day | RECTAL | Status: DC | PRN

## 2019-11-02 MED ORDER — ONDANSETRON HCL 4 MG/2ML IJ SOLN: 4.0000 mg | INTRAMUSCULAR | Status: DC | PRN

## 2019-11-02 MED ORDER — ZOLPIDEM TARTRATE 5 MG PO TABS: 5.0000 mg | ORAL_TABLET | Freq: Every evening | ORAL | Status: DC | PRN

## 2019-11-02 MED ORDER — BENZOCAINE-MENTHOL 20-0.5 % EX AERO: 1.0000 "application " | INHALATION_SPRAY | CUTANEOUS | Status: DC | PRN

## 2019-11-02 MED ORDER — MEASLES, MUMPS & RUBELLA VAC IJ SOLR: 0.5000 mL | Freq: Once | INTRAMUSCULAR | Status: DC

## 2019-11-02 MED ORDER — TETANUS-DIPHTH-ACELL PERTUSSIS 5-2.5-18.5 LF-MCG/0.5 IM SUSP: 0.5000 mL | Freq: Once | INTRAMUSCULAR | Status: DC

## 2019-11-02 MED ORDER — METHYLERGONOVINE MALEATE 0.2 MG/ML IJ SOLN: 0.2000 mg | INTRAMUSCULAR | Status: DC | PRN

## 2019-11-02 MED ORDER — COCONUT OIL OIL: 1.0000 "application " | TOPICAL_OIL | Status: DC | PRN

## 2019-11-02 MED ORDER — ACETAMINOPHEN 325 MG PO TABS
650.0000 mg | ORAL_TABLET | ORAL | Status: DC | PRN
  Filled 2019-11-02: qty 2

## 2019-11-02 MED ORDER — DIPHENHYDRAMINE HCL 25 MG PO CAPS: 25.0000 mg | ORAL_CAPSULE | Freq: Four times a day (QID) | ORAL | Status: DC | PRN

## 2019-11-02 MED ORDER — IBUPROFEN 600 MG PO TABS
600.0000 mg | ORAL_TABLET | Freq: Four times a day (QID) | ORAL | Status: DC
  Administered 2019-11-02 – 2019-11-03 (×4): 600 mg via ORAL
  Filled 2019-11-02 (×6): qty 1

## 2019-11-02 MED ORDER — DIBUCAINE (PERIANAL) 1 % EX OINT: 1.0000 "application " | TOPICAL_OINTMENT | CUTANEOUS | Status: DC | PRN

## 2019-11-02 MED ORDER — WITCH HAZEL-GLYCERIN EX PADS: 1.0000 "application " | MEDICATED_PAD | CUTANEOUS | Status: DC | PRN

## 2019-11-02 MED ORDER — DOCUSATE SODIUM 100 MG PO CAPS
100.0000 mg | ORAL_CAPSULE | Freq: Two times a day (BID) | ORAL | Status: DC
  Administered 2019-11-02 – 2019-11-03 (×4): 100 mg via ORAL
  Filled 2019-11-02 (×4): qty 1

## 2019-11-02 MED ORDER — ONDANSETRON HCL 4 MG PO TABS: 4.0000 mg | ORAL_TABLET | ORAL | Status: DC | PRN

## 2019-11-02 MED ORDER — PRENATAL MULTIVITAMIN CH
1.0000 | ORAL_TABLET | Freq: Every day | ORAL | Status: DC
  Administered 2019-11-02 – 2019-11-03 (×2): 1 via ORAL
  Filled 2019-11-02 (×2): qty 1

## 2019-11-02 MED ORDER — FERROUS SULFATE 325 (65 FE) MG PO TABS
325.0000 mg | ORAL_TABLET | Freq: Two times a day (BID) | ORAL | Status: DC
  Administered 2019-11-02 – 2019-11-03 (×3): 325 mg via ORAL
  Filled 2019-11-02 (×3): qty 1

## 2019-11-02 MED ORDER — BISACODYL 10 MG RE SUPP: 10.0000 mg | Freq: Every day | RECTAL | Status: DC | PRN

## 2019-11-02 NOTE — Social Work (Signed)
CSW received consult for hx of Anxiety and Postpartum Depression.  CSW met with MOB to offer support and complete assessment.     CSW met introduced self and reason for visit. CSW congratulated MOB and asked how she is currently feeling. MOB stated she is doing well and feeling fine. CSW asked MOB about her mental health history. MOB stated she has been dealing with anxiety since her teenage years, but was not officially diagnosed until a few months ago. MOB stated when she was diagnosed she was also prescribed medication to help manage it. CSW asked MOB about her PPD history. 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 CSW asked MOB how she coped with the PPD, she expressed she attended therapy at [facility]. CSW asked MOB if therapy was helpful, she stated yes. CSW asked MOB who she identifies as supports, she expressed FOB and her entire immediate family. CSW asked MOB if she would attend therapy again, she stated yes. MOB declined any SI, HI or being involved in DV. MOB stated overall she is doing well right now.   CSW provided education regarding the baby blues period vs. perinatal mood disorders, discussed treatment and gave resources for mental health follow up if concerns arise.  CSW recommends self-evaluation during the postpartum time period using the New Mom Checklist from Postpartum Progress and encouraged MOB to contact a medical professional if symptoms are noted at any time.    CSW observed baby sleeping face down on MOB stomach. CSW provided education on Sudden Infant Death Syndrome (SIDS) precautions. CSW informed MOB of baby of the importance for baby not to co-sleep with anyone and to use the bedside basinet when sleeping. MOB expressed understanding. CSW asked MOB where baby will sleep once home, she stated in a basinet.   MOB expressed she has all of the essential items to care for baby, including a brand new carseat. MOB stated baby will  attend follow-up care at [facility]. MOB denies any transportation barriers. MOB declined needing any additional resources at this time.  CSW spoke with RN for MOB and informed her of baby co-sleeping and education provided to [facility]. RN expressed no additional concerns.   CSW identifies no further need for intervention and no barriers to discharge at this time.  Darra Lis, [place] Worker Women's and [employer]

## 2019-11-02 NOTE — Lactation Note (Addendum)
This note was copied from a baby's chart. Lactation Consultation Note  Patient Name: Krystal Glass Date: 11/02/2019 Reason for consult: Initial assessment;Mother's request;Term;Nipple pain/trauma;Other (Comment);Maternal endocrine disorder Type of Endocrine Disorder?: PCOS, GDM  Initial visit with 23 hours old infant with 1.39% weight loss of a P2 mother. Mother is attempting to latch infant to left breast, cradle position upon arrival. Mother reports breastfeeding is going well but her nipples are sore. Mother explains she is concerned her infant is repositioning to a shallower latch. Encouraged mother to bring infant closer to her body and using support pillows to maintain position. Mother reports a better latch with modified cradle and supporting infant's back.Talked about infant's proper alignment and asymetrical latch for a better milk transfer. Demonstrated how to improve infant's posture and showed strategies to keep infant awake while feeding. Parents verbalized agreement.  Mother expressed her concern for a potential low milk supply. Mother has PCOS and had issues with low milk supply with first child. Mother states she is interested in starting pumping early for better stimulation/supply. Explained the pros and cons of an abundant milk supply. Parents verbalized understanding.  Reviewed HE and mother demonstrated. Parents spoon-fed collected colostrum ~60mL. Reviewed pumping with mother's personal pump.  Provided comfort gels for sore nipples.  Infant had a large meconium stool. Mother is currently taking Buspar & Visparil. Invited mother to talk her pediatrician about medications and BF. Limited information regarding use of these medications and BF according to LactMed. Reviewed with mother average size of a NB stomach. Reviewed colostrum benefits for baby. Reviewed breastfeeding basics. Reviewed newborn behavior, feeding patterns and expectations with mother. Talked to mother  about the benefits of a deep latch to prevent nipple pain. Encouraged to contact [facility] for support when ready to breastfeed baby and recommended to request help for questions or concerns.     Maternal Data Formula Feeding for Exclusion: No Has patient been taught Hand Expression?: Yes Does the patient have breastfeeding experience prior to this delivery?: Yes  Feeding Feeding Type: Breast Fed  LATCH Score Latch: Grasps breast easily, tongue down, lips flanged, rhythmical sucking.  Audible Swallowing: Spontaneous and intermittent  Type of Nipple: Everted at rest and after stimulation  Comfort (Breast/Nipple): Filling, red/small blisters or bruises, mild/mod discomfort  Hold (Positioning): Assistance needed to correctly position infant at breast and maintain latch.  LATCH Score: 8  Interventions Interventions: Breast feeding basics reviewed;Assisted with latch;Breast massage;Hand express;Adjust position;Support pillows;Expressed milk;Comfort gels  Lactation Tools Discussed/Used Tools: Comfort gels WIC Program: Yes   Consult Status Consult Status: Follow-up Date: 11/03/19 Follow-up type: In-patient    Krystal Glass 11/02/2019, 10:07 PM

## 2019-11-03 DIAGNOSIS — E669 Obesity, unspecified: Secondary | ICD-10-CM

## 2019-11-03 DIAGNOSIS — O99345 Other mental disorders complicating the puerperium: Secondary | ICD-10-CM

## 2019-11-03 DIAGNOSIS — O99215 Obesity complicating the puerperium: Secondary | ICD-10-CM

## 2019-11-03 DIAGNOSIS — F419 Anxiety disorder, unspecified: Secondary | ICD-10-CM

## 2019-11-03 DIAGNOSIS — O24435 Gestational diabetes mellitus in puerperium, controlled by oral hypoglycemic drugs: Secondary | ICD-10-CM

## 2019-11-03 MED ORDER — ACETAMINOPHEN 325 MG PO TABS: 650.0000 mg | ORAL_TABLET | ORAL | Status: DC | PRN

## 2019-11-03 MED ORDER — IBUPROFEN 600 MG PO TABS
600.0000 mg | ORAL_TABLET | Freq: Four times a day (QID) | ORAL | 0 refills | Status: DC
Start: 2019-11-03 — End: 2020-11-02

## 2019-11-15 NOTE — BH Specialist Note (Signed)
Pt did not arrive to video visit and did not answer the phone ; Voicemail not set up, so unable to leave voice message; left MyChart message for patient.    

## 2019-11-16 ENCOUNTER — Encounter: Payer: Self-pay | Admitting: *Deleted

## 2019-11-17 ENCOUNTER — Other Ambulatory Visit: Payer: Self-pay | Admitting: *Deleted

## 2019-11-17 ENCOUNTER — Other Ambulatory Visit: Payer: Self-pay

## 2019-11-17 DIAGNOSIS — Z20822 Contact with and (suspected) exposure to covid-19: Secondary | ICD-10-CM

## 2019-11-17 MED ORDER — HYDROXYZINE PAMOATE 50 MG PO CAPS: 50.0000 mg | ORAL_CAPSULE | Freq: Every day | ORAL | 0 refills | Status: DC

## 2019-11-18 ENCOUNTER — Ambulatory Visit: Payer: Self-pay | Admitting: Clinical

## 2019-11-18 DIAGNOSIS — Z91199 Patient's noncompliance with other medical treatment and regimen due to unspecified reason: Secondary | ICD-10-CM

## 2019-11-19 LAB — SARS-COV-2, NAA 2 DAY TAT

## 2019-11-19 LAB — NOVEL CORONAVIRUS, NAA: SARS-CoV-2, NAA: DETECTED — AB

## 2019-11-20 ENCOUNTER — Telehealth: Payer: Self-pay | Admitting: Nurse Practitioner

## 2019-11-20 NOTE — Telephone Encounter (Signed)
Called to discuss with Krystal Glass about Covid symptoms and the use of casirivimab/imdevimab, a combination monoclonal antibody infusion for those with mild to moderate Covid symptoms and at a high risk of hospitalization.     Pt is qualified for this infusion at the [facility] infusion center due to co-morbid conditions (BMI and Hispanic).   Unable to reach. Voicemail left and mychart message sent.   Patient Active Problem List   Diagnosis Date Noted  . GDM (gestational diabetes mellitus) 11/01/2019  . Abnormal left fetal kidney on ultrasound 10/29/2019  . Excessive fetal growth affecting management of mother in third trimester, antepartum 10/27/2019  . Gestational diabetes mellitus 08/21/2019  . Anxiety 08/20/2019  . Supervision of high-risk pregnancy, third trimester 04/22/2019  . History of postpartum depression, currently pregnant 04/22/2019  . PCOS (polycystic ovarian syndrome)   . Obesity in pregnancy 03/28/2017    Willette Alma, AGPCNP-BC

## 2019-11-21 ENCOUNTER — Telehealth: Payer: Self-pay | Admitting: Physician Assistant

## 2019-11-21 NOTE — Telephone Encounter (Signed)
  Called to discuss with patient about Covid symptoms and the use of casirivimab/imdevimab, a monoclonal antibody infusion for those with mild to moderate Covid symptoms and at a high risk of hospitalization.   Patient is asymptomatic, call back number given.   Manson Passey, PA - C

## 2019-11-30 ENCOUNTER — Telehealth (INDEPENDENT_AMBULATORY_CARE_PROVIDER_SITE_OTHER): Payer: Self-pay | Admitting: Student

## 2019-11-30 DIAGNOSIS — O24419 Gestational diabetes mellitus in pregnancy, unspecified control: Secondary | ICD-10-CM

## 2019-11-30 DIAGNOSIS — O24439 Gestational diabetes mellitus in the puerperium, unspecified control: Secondary | ICD-10-CM

## 2019-11-30 NOTE — Progress Notes (Signed)
I connected with@ on 11/30/19 at  2:15 PM EDT by: Mychart video and verified that I am speaking with the correct person using two identifiers.  Patient is located at her mothers house  and provider is located at [employer] for [employer] [facility] [employer] [facility] .     The purpose of this virtual visit is to provide medical care while limiting exposure to the novel coronavirus. I discussed the limitations, risks, security and privacy concerns of performing an evaluation and management service by mychart and the availability of in person appointments. I also discussed with the patient that there may be a patient responsible charge related to this service. By engaging in this virtual visit, you consent to the provision of healthcare.  Additionally, you authorize for your insurance to be billed for the services provided during this visit.  The patient expressed understanding and agreed to proceed.  The following staff members participated in the virtual visit: Rica Mote Judeth Horn, NP   Post Partum Visit Note Subjective:   Krystal Glass is a 29 y.o. [phone] female being evaluated for postpartum followup.  She is 4 weeks postpartum following a SVD at  39.1 gestational weeks.  I have fully reviewed the prenatal and intrapartum course; pregnancy complicated by GDM .  Postpartum course has been unremarkable. Baby is doing well. Baby is feeding by both breast and bottle - gerber goodstart. Bleeding staining only. Bowel function is normal. Bladder function is normal. Patient is sexually active. Contraception method is condoms. Postpartum depression screening: Negative.   The pregnancy intention screening data noted above was reviewed. Potential methods of contraception were discussed. The patient elected to proceed with Vasectomy.   The following portions of the patient's history were reviewed and updated as appropriate: allergies, current medications, past family history, past medical  history, past social history, past surgical history and problem list.  Review of Systems Pertinent items are noted in HPI.   Objective:  There were no vitals filed for this visit. Self-Obtained       Assessment:    Normal postpartum exam.  Plan:  Essential components of care per ACOG recommendations:  1.  Mood and well being: Patient with negative depression screening today. Reviewed local resources for support.  - Patient does not use tobacco.  - hx of drug use? No    2. Infant care and feeding:  -Patient currently breastmilk feeding? Yes If breastmilk feeding discussed return to work and pumping. If needed, patient was provided letter for work to allow for every 2-3 hr pumping breaks, and to be granted a private location to express breastmilk and refrigerated area to store breastmilk. Reviewed importance of draining breast regularly to support lactation. -Social determinants of health (SDOH) reviewed in EPIC. No concerns  3. Sexuality, contraception and birth spacing - Patient does not want a pregnancy in the next year.  Desired family size is 2 children.  - Reviewed forms of contraception in tiered fashion. Patient desired vasectomy today.   - Discussed birth spacing of 18 months  4. Sleep and fatigue -Encouraged family/partner/community support of 4 hrs of uninterrupted sleep to help with mood and fatigue  5. Physical Recovery  - Discussed patients delivery and complications - patient is very happy with her delivery experience - Patient had a no lacerations - Patient has urinary incontinence? No  - Patient is safe to resume physical and sexual activity  6.  Health Maintenance - Last pap smear done 2018 and was normal  with negative HPV. She is due for a pap smear & is self pay. Referral send for BCCCP program  7. No Chronic Disease - PCP follow up - given list of PCPs  8. Gestational diabetes - will have return within the next 2 weeks for a pp GTT  12 minutes of  non-face-to-face time spent with the patient    Judeth Horn, NP Center for [employer], [facility]

## 2019-11-30 NOTE — Patient Instructions (Addendum)
Primary Care Resources:  [facility]  [address] [place] [address] Ph [phone] Every 2nd Saturday 9am-12pm  [email or web address] FREE Services  - [facility]  [address] [place] Ph [phone]  [address] [place] Ph [phone]  [email or web address] $45 per visit/Walk-in only  - [facility]  [address] [place] [address] Ph [phone]  1st & 3rd Saturday of each month 9:30am-12:30pm [email or web address] Sliding fee scale/Call to make an appointment  - [facility]  [address] [place] Ph [phone]  Hours Mon-Fri 9am-7pm & Sat 9am-1pm [email or web address] Visits start at $45 per visit/Call to make an appointment  [facility]  [address] [place] [address] Ph [phone]  Hours Mon-Wed 8:30am-5pm & Thurs 8:30am-8pm $5 per visit/Call for an eligibility appointment      Health Maintenance, Female Adopting a healthy lifestyle and getting preventive care are important in promoting health and wellness. Ask your health care provider about:  The right schedule for you to have regular tests and exams.  Things you can do on your own to prevent diseases and keep yourself healthy. What should I know about diet, weight, and exercise? Eat a healthy diet   Eat a diet that includes plenty of vegetables, fruits, low-fat dairy products, and lean protein.  Do not eat a lot of foods that are high in solid fats, added sugars, or sodium. Maintain a healthy weight Body mass index (BMI) is used to identify weight problems. It estimates body fat based on height and weight. Your health care provider can help determine your BMI and help you achieve or maintain a healthy weight. Get regular exercise Get regular exercise. This is one of the most important things you can do for your health. Most adults should:  Exercise  for at least 150 minutes each week. The exercise should increase your heart rate and make you sweat (moderate-intensity exercise).  Do strengthening exercises at least twice a week. This is in addition to the moderate-intensity exercise.  Spend less time sitting. Even light physical activity can be beneficial. Watch cholesterol and blood lipids Have your blood tested for lipids and cholesterol at 27 years of age, then have this test every 5 years. Have your cholesterol levels checked more often if:  Your lipid or cholesterol levels are high.  You are older than 27 years of age.  You are at high risk for heart disease. What should I know about cancer screening? Depending on your health history and family history, you may need to have cancer screening at various ages. This may include screening for:  Breast cancer.  Cervical cancer.  Colorectal cancer.  Skin cancer.  Lung cancer. What should I know about heart disease, diabetes, and high blood pressure? Blood pressure and heart disease  High blood pressure causes heart disease and increases the risk of stroke. This is more likely to develop in people who have high blood pressure readings, are of African descent, or are overweight.  Have your blood pressure checked: ? Every 3-5 years if you are 62-9 years of age. ? Every year if you are 39 years old or older. Diabetes Have regular diabetes screenings. This checks your fasting blood sugar level. Have the screening done:  Once every three years after age 59 if you are at a normal weight and have a low risk for diabetes.  More often and  at a younger age if you are overweight or have a high risk for diabetes. What should I know about preventing infection? Hepatitis B If you have a higher risk for hepatitis B, you should be screened for this virus. Talk with your health care provider to find out if you are at risk for hepatitis B infection. Hepatitis C Testing is recommended  for:  Everyone born from [birth year] through 1963.  Anyone with known risk factors for hepatitis C. Sexually transmitted infections (STIs)  Get screened for STIs, including gonorrhea and chlamydia, if: ? You are sexually active and are younger than 27 years of age. ? You are older than 27 years of age and your health care provider tells you that you are at risk for this type of infection. ? Your sexual activity has changed since you were last screened, and you are at increased risk for chlamydia or gonorrhea. Ask your health care provider if you are at risk.  Ask your health care provider about whether you are at high risk for HIV. Your health care provider may recommend a prescription medicine to help prevent HIV infection. If you choose to take medicine to prevent HIV, you should first get tested for HIV. You should then be tested every 3 months for as long as you are taking the medicine. Pregnancy  If you are about to stop having your period (premenopausal) and you may become pregnant, seek counseling before you get pregnant.  Take 400 to 800 micrograms (mcg) of folic acid every day if you become pregnant.  Ask for birth control (contraception) if you want to prevent pregnancy. Osteoporosis and menopause Osteoporosis is a disease in which the bones lose minerals and strength with aging. This can result in bone fractures. If you are 66 years old or older, or if you are at risk for osteoporosis and fractures, ask your health care provider if you should:  Be screened for bone loss.  Take a calcium or vitamin D supplement to lower your risk of fractures.  Be given hormone replacement therapy (HRT) to treat symptoms of menopause. Follow these instructions at home: Lifestyle  Do not use any products that contain nicotine or tobacco, such as cigarettes, e-cigarettes, and chewing tobacco. If you need help quitting, ask your health care provider.  Do not use street drugs.  Do not share  needles.  Ask your health care provider for help if you need support or information about quitting drugs. Alcohol use  Do not drink alcohol if: ? Your health care provider tells you not to drink. ? You are pregnant, may be pregnant, or are planning to become pregnant.  If you drink alcohol: ? Limit how much you use to 0-1 drink a day. ? Limit intake if you are breastfeeding.  Be aware of how much alcohol is in your drink. In the [place] one drink equals one 12 oz bottle of beer (355 mL), one 5 oz glass of wine (148 mL), or one 1 oz glass of hard liquor (44 mL). General instructions  Schedule regular health, dental, and eye exams.  Stay current with your vaccines.  Tell your health care provider if: ? You often feel depressed. ? You have ever been abused or do not feel safe at home. Summary  Adopting a healthy lifestyle and getting preventive care are important in promoting health and wellness.  Follow your health care provider's instructions about healthy diet, exercising, and getting tested or screened for diseases.  Follow your health care  provider's instructions on monitoring your cholesterol and blood pressure. This information is not intended to replace advice given to you by your health care provider. Make sure you discuss any questions you have with your health care provider. Document Revised: 01/21/2018 Document Reviewed: 01/21/2018 Elsevier Patient Education  2020 [employer].

## 2019-12-01 ENCOUNTER — Telehealth: Payer: Self-pay

## 2019-12-01 NOTE — Telephone Encounter (Signed)
Telephoned patient at home number. Left a voice message with [facility] contact information. 

## 2020-01-17 ENCOUNTER — Ambulatory Visit: Payer: Self-pay

## 2020-03-06 ENCOUNTER — Other Ambulatory Visit: Payer: Self-pay

## 2020-03-06 ENCOUNTER — Other Ambulatory Visit: Payer: Self-pay | Admitting: *Deleted

## 2020-03-06 DIAGNOSIS — Z124 Encounter for screening for malignant neoplasm of cervix: Secondary | ICD-10-CM

## 2020-03-06 NOTE — Progress Notes (Addendum)
Patient: Krystal Glass           Date of Birth: 11/16/1990           MRN: [id number] Visit Date: 03/06/2020 PCP: Patient, No Pcp Per  Cervical Cancer Screening Do you smoke?: No Have you ever had or been told you have an allergy to latex products?: No Marital status: Married Date of last pap smear: 2-5 yrs ago (11/21/2016-negative. [facility]) Date of last menstrual period: 02/20/20 Number of pregnancies: 3 Number of births: 2 Have you ever had any of the following? Hysterectomy: No Tubal ligation (tubes tied): No Abnormal bleeding: No Abnormal pap smear: No Venereal warts: No A sex partner with venereal warts: No A high risk* sex partner: No  Cervical Exam  Abnormal Observations: Thick yellowish colored discharge observed. Wet prep completed. Recommendations: Last Pap smear was 11/21/2016 at [facility] and normal. Per patient has no history of an abnormal Pap smear. Last Pap smear result is in Epic. Let patient know that if today's Pap smear is normal that her next Pap smear is due in 3 years. Let patient know will follow-up with her within the next week with results of wet prep and Pap smear by phone.      Patient's History Patient Active Problem List   Diagnosis Date Noted  . Gestational diabetes mellitus 08/21/2019  . Anxiety 08/20/2019  . History of postpartum depression, currently pregnant 04/22/2019  . PCOS (polycystic ovarian syndrome)    Past Medical History:  Diagnosis Date  . 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 Closed displaced fracture of left clavicle 10/12/2015  . Closed displaced segmental fracture of shaft of left humerus 10/09/2015  . Gestational diabetes   . PCOS (polycystic ovarian syndrome)   . Postpartum depression     Family History  Problem Relation Age of Onset  . Hypothyroidism Mother     Social History   Occupational History  . Not on file  Tobacco Use  . Smoking status: Never Smoker   . Smokeless tobacco: Never Used  Vaping Use  . Vaping Use: Never used  Substance and Sexual Activity  . Alcohol use: No  . Drug use: No  . Sexual activity: Yes    Birth control/protection: None

## 2020-03-07 ENCOUNTER — Telehealth: Payer: Self-pay

## 2020-03-07 LAB — CYTOLOGY - PAP: Diagnosis: NEGATIVE

## 2020-03-07 LAB — CERVICOVAGINAL ANCILLARY ONLY
Bacterial Vaginitis (gardnerella): NEGATIVE
Candida Glabrata: NEGATIVE
Candida Vaginitis: POSITIVE — AB
Comment: NEGATIVE
Comment: NEGATIVE
Comment: NEGATIVE
Comment: NEGATIVE
Trichomonas: NEGATIVE

## 2020-03-07 NOTE — Telephone Encounter (Signed)
Attempted to contact patient regarding Pap/Wet prep results. Left message on voicemail requesting a return call.

## 2020-03-08 ENCOUNTER — Other Ambulatory Visit: Payer: Self-pay

## 2020-03-08 ENCOUNTER — Telehealth: Payer: Self-pay

## 2020-03-08 MED ORDER — FLUCONAZOLE 150 MG PO TABS: 150.0000 mg | ORAL_TABLET | Freq: Once | ORAL | 0 refills | Status: AC

## 2020-03-08 NOTE — Telephone Encounter (Signed)
Patient informed negative Pap/HPV results, Wet Prep-positive for yeast infection, needs rx Diflucan sent to pharmacy. Patient verbalized understanding, and requested rx to be sent to [address] Rx sent.

## 2020-05-08 ENCOUNTER — Ambulatory Visit (INDEPENDENT_AMBULATORY_CARE_PROVIDER_SITE_OTHER): Payer: No Payment, Other | Admitting: Clinical

## 2020-05-08 ENCOUNTER — Other Ambulatory Visit: Payer: Self-pay

## 2020-05-08 DIAGNOSIS — F331 Major depressive disorder, recurrent, moderate: Secondary | ICD-10-CM | POA: Diagnosis not present

## 2020-05-12 DIAGNOSIS — F331 Major depressive disorder, recurrent, moderate: Secondary | ICD-10-CM | POA: Insufficient documentation

## 2020-05-12 NOTE — Progress Notes (Signed)
Comprehensive Clinical Assessment (CCA) Note  05/08/2020 Krystal Glass [id number]  Chief Complaint:  Chief Complaint  Patient presents with  . Anxiety  . Depression   Visit Diagnosis:  Major depressive disorder, recurrent episode, moderate w/ anxious distress   Interpretive Summary:   Client is a 30 year old female presenting to [facility] for outpatient behavioral health services. Client is referred by her OBGYN at [facility] clinic for a clinical assessment. Client presents with a history of depression and anxiety. Client reported she has struggled with depression and anxiety since the birth of her second child, a 70-monthold son. Client endorses depressed mood, feeling out of control, feeling on edge, passive SI without plan or intent, and constantly anxious about everything". I've struggled with anxiety and depression since my teens. Never treat just always self-treated but after having first son three years ago I feel like I can't manage it on my own. Client reported her depression developed from circumstances coming from an immigrant family and had to become responsible early. Client reported feelings of worthlessness since high school. Client reported in her family mental health is taboo and had no one to talk to about her suicidal thoughts. Client reported she has had intrusive thoughts of passive suicidal ideation without plan and/ or intent. Client reported no history of hospitalization for mental health reasons. Client denied substance use history. Client presented oriented times five, appropriately dressed, friendly, and cooperative. Client denied homicidal/ suicidal ideation, hallucinations, and delusions. Client was screened for pain, nutritional, CMalawisuicide severity scale, and the following SDOH: GAD 7 : Generalized Anxiety Score 05/08/2020 10/27/2019 10/22/2019 10/21/2019  Nervous, Anxious, on Edge _0 Control/stop worrying _1 0  Worry too much - different things _2 Trouble relaxing _3 0  Restless _4 Easily annoyed or irritable _5 Afraid - awful might happen 3 1 0 0  Total GAD 7 Score _6 Anxiety Difficulty Very difficult - - [facility] Total Score 5     Flowsheet Row Counsel[place] Total Score 19      Treatment recommendations:  individual therapy, psychiatric evaluation and medication management  Therapist provided information on format of appointment (virtual or face to face).  The client was advised to call back or seek an in-person evaluation if the symptoms worsen or if the condition fails to improve as anticipated before the next scheduled appointment. Client was in agreement with treatment recommendations.   CCA Biopsychosocial Intake/Chief Complaint:  Client reported she spoke to her OBGYN physician about anxiety following the birth of her 671-monthld son. Client reported she had brief session with an outpatient therapist before having baby but stopped once he was born. Client reported "I feel like I'm having postpartum depression, I feel out of control sometimes".  Current Symptoms/Problems: depressed mood, feeling on edge, tearfulness   Patient Reported Schizophrenia/Schizoaffective Diagnosis in Past: No  Type of Services Patient Feels are Needed: psychiatric evaluation, medication management, individual therapy   Initial Clinical Notes/Concerns: No data recorded  Mental Health Symptoms Depression:  Change in energy/activity; Difficulty Concentrating; Hopelessness; Increase/decrease in appetite; Irritability; Sleep (too much or little); Tearfulness   Duration of Depressive symptoms: Greater than two weeks   Mania:  None   Anxiety:   Tension; Difficulty concentrating  Psychosis:  None   Duration of Psychotic symptoms: No data recorded  Trauma:  None    Obsessions:  None   Compulsions:  None   Inattention:  None   Hyperactivity/Impulsivity:  N/A   Oppositional/Defiant Behaviors:  None   Emotional Irregularity:  None   Other Mood/Personality Symptoms:  No data recorded   Mental Status Exam Appearance and self-care  Stature:  Average   Weight:  Obese   Clothing:  Casual   Grooming:  Normal   Cosmetic use:  Age appropriate   Posture/gait:  Normal   Motor activity:  Not Remarkable   Sensorium  Attention:  Normal   Concentration:  Normal   Orientation:  X5   Recall/memory:  Normal   Affect and Mood  Affect:  Depressed   Mood:  Depressed   Relating  Eye contact:  Normal   Facial expression:  Depressed   Attitude toward examiner:  Cooperative   Thought and Language  Speech flow: Clear and Coherent   Thought content:  Appropriate to Mood and Circumstances   Preoccupation:  None   Hallucinations:  None   Organization:  No data recorded  [employer] of Knowledge:  Good   Intelligence:  Average   Abstraction:  Normal   Judgement:  Good   Reality Testing:  Adequate   Insight:  Good   Decision Making:  Normal   Social Functioning  Social Maturity:  Isolates   Social Judgement:  Normal   Stress  Stressors:  Family conflict   Coping Ability:  Normal   Skill Deficits:  Communication; Self-care; Activities of daily living   Supports:  Support needed; Family     Religion: Religion/Spirituality Are You A Religious Person?: No  Leisure/Recreation: Leisure / Recreation Do You Have Hobbies?: No  Exercise/Diet: Exercise/Diet Do You Exercise?: No Have You Gained or Lost A Significant Amount of Weight in the Past Six Months?: No Do You Follow a Special Diet?: No Do You Have Any Trouble Sleeping?: Yes   CCA Employment/Education Employment/Work Situation: Employment / Work Situation Employment situation: Unemployed  Education: Education Did Teacher, adult education From [employer]?: Yes   CCA Family/Childhood History Family and Relationship History: Family history Marital status: Married Number of Years Married: 5 What types of issues is patient dealing with in the relationship?: married since 2016. Client reported they both have struggled with communication due to depression and feelings overwhlemed at times. Does patient have children?: Yes How many children?: 2 How is patient's relationship with their children?: Good. Client reported they share a 28 year old and a 53 month old.  Childhood History:  Childhood History By whom was/is the patient raised?: Both parents Additional childhood history information: Client reported she was born in [place]. Client reported she was raised by her biological parents. Client reported she had to mature quickly and be responsible for her siblings while her parents worked. 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 Client reported they grew up poor. Client reported since 9 or 10 she knew things. Client reported their basic needs were barely met. Her dad left her mom when she was born and came back when she was 5. Does patient have siblings?: Yes Number of Siblings: 5 Did patient suffer any verbal/emotional/physical/sexual abuse as a child?: No Did patient suffer from severe childhood  neglect?: No Has patient ever been sexually abused/assaulted/raped as an adolescent or adult?: No Was the patient ever a victim of a crime or a disaster?: No Witnessed domestic violence?: No Has patient been affected by domestic violence as an adult?: No  Child/Adolescent Assessment:     CCA Substance Use Alcohol/Drug Use: Alcohol / Drug Use History of alcohol / drug use?: No history of alcohol / drug abuse                         ASAM's:  Six Dimensions of  Multidimensional Assessment  Dimension 1:  Acute Intoxication and/or Withdrawal Potential:      Dimension 2:  Biomedical Conditions and Complications:      Dimension 3:  Emotional, Behavioral, or Cognitive Conditions and Complications:     Dimension 4:  Readiness to Change:     Dimension 5:  Relapse, Continued use, or Continued Problem Potential:     Dimension 6:  Recovery/Living Environment:     ASAM Severity Score:    ASAM Recommended Level of Treatment:     Substance use Disorder (SUD)    Recommendations for Services/Supports/Treatments:    DSM5 Diagnoses: Patient Active Problem List   Diagnosis Date Noted  . Major depressive disorder, recurrent episode, moderate with anxious distress ([place]) 05/12/2020  . Gestational diabetes mellitus 08/21/2019  . Anxiety 08/20/2019  . History of postpartum depression, currently pregnant 04/22/2019  . PCOS (polycystic ovarian syndrome)     Patient Centered Plan: Patient is on the following Treatment Plan(s):  Depression   Referrals to Alternative Service(s): Referred to Alternative Service(s):   Place:   Date:   Time:    Referred to Alternative Service(s):   Place:   Date:   Time:    Referred to Alternative Service(s):   Place:   Date:   Time:    Referred to Alternative Service(s):   Place:   Date:   Time:     Bernestine Amass, LCSW

## 2020-06-16 ENCOUNTER — Ambulatory Visit (HOSPITAL_COMMUNITY): Payer: No Payment, Other | Admitting: Clinical

## 2020-07-11 ENCOUNTER — Ambulatory Visit (HOSPITAL_COMMUNITY): Payer: Self-pay | Admitting: Clinical

## 2020-08-07 ENCOUNTER — Ambulatory Visit (INDEPENDENT_AMBULATORY_CARE_PROVIDER_SITE_OTHER): Payer: No Payment, Other | Admitting: Clinical

## 2020-08-07 ENCOUNTER — Other Ambulatory Visit: Payer: Self-pay

## 2020-08-07 DIAGNOSIS — F331 Major depressive disorder, recurrent, moderate: Secondary | ICD-10-CM

## 2020-08-08 NOTE — Progress Notes (Signed)
   THERAPIST PROGRESS NOTE  Session Time: 45 minutes  Participation Level: Active  Behavioral Response: CasualAlertDepressed  Type of Therapy: Individual Therapy  Treatment Goals addressed: Coping  Interventions: CBT and Supportive  Summary:  Krystal Glass is a 30 y.o. female who presents for the scheduled session oriented times five, appropriately dressed, and friendly. Client denied hallucinations and delusions. Client reported on today she is doing fairly well. Client reported she recently celebrated her birthday with her family. Client reported her birthday provoked her to have thoughts about "where the time has gone" and everything she's been through in her relationship. Client reported she finds herself reflecting over the nature of her relationship with her husband. Client reported her husband has broken her trust by emotionally cheating on her by evidence that she has found in his phone.  Client reported she would like for her relationship to improve but her husband has been resistant to seek counseling.  Client reported he has been very supportive, helpful around the house, and a great father but he has caused her peace of mind.  Client reported otherwise the recent political events concerning [place] have onset memories of her previous terminated pregnancy.  Client reported she has no regrets due to her circumstances at that time and has only told to friend about that decision.  Client reported she feels she has unresolved emotions pertaining to that decision.   Suicidal/Homicidal: Nowithout intent/plan  Therapist Response:  Therapist began the session by asking the client how she has been since the last session. Therapist used eye contact, direct eye contact, and positive emotional support as she discussed her thoughts and feelings. Therapist used CBT to engage with the client to normalize her emotions regarding her thoughts. Therapist used CBT to ask open-ended questions to  gain clarity on origin of her thoughts and emotions. Therapist assigned homework for the client to brainstorm a list of components that describes a healthy relationship to her. Client was scheduled for next appointment.    Plan: Return again in 4 weeks for individual therapy.  Diagnosis: Major depressive disorder, recurrent episode, moderate w/ anxious distress   Krystal Rhymes Brisa Auth, LCSW 08/08/2020

## 2020-10-05 ENCOUNTER — Ambulatory Visit (HOSPITAL_COMMUNITY): Payer: No Payment, Other | Admitting: Clinical

## 2020-10-13 ENCOUNTER — Ambulatory Visit (HOSPITAL_COMMUNITY): Payer: No Payment, Other | Admitting: Clinical

## 2020-10-23 ENCOUNTER — Other Ambulatory Visit: Payer: Self-pay

## 2020-10-23 ENCOUNTER — Ambulatory Visit (HOSPITAL_COMMUNITY): Payer: Self-pay | Admitting: Clinical

## 2020-10-23 ENCOUNTER — Ambulatory Visit (INDEPENDENT_AMBULATORY_CARE_PROVIDER_SITE_OTHER): Payer: No Payment, Other | Admitting: Clinical

## 2020-10-23 DIAGNOSIS — F331 Major depressive disorder, recurrent, moderate: Secondary | ICD-10-CM | POA: Diagnosis not present

## 2020-10-23 NOTE — Progress Notes (Signed)
   THERAPIST PROGRESS NOTE  Session Time: 45 minutes  Participation Level: Active  Behavioral Response: CasualAlertDepressed  Type of Therapy: Individual Therapy  Treatment Goals addressed: Coping  Interventions: CBT and Supportive  Summary:  Krystal Glass is a 30 y.o. female who presents for the scheduled session oriented x5, appropriately dressed, and friendly.  Denied hallucinations and delusions. Client reported on today she has been having emotions of depression and anxiety over the past few weeks.  Client reported she thinks she has seasonal depression.  Client reported she has been having constant irritability and anxiety pertaining to everything that she needs to get done for her boys.  Client reported she feels bad because even though they are asking their age she is quick to feel frustrated lately.  Client reported her oldest son who is 26 years old began preschool.  Client reported having anxiety about him going to a "regular school" and being autistic.  Client reported she is worried about how he will be treated amongst his peers.  Client reported one of her major stressors is being worrying about the choices she makes about her sons wellbeing in education and how will affect their future.  Client reported those worries tied into her childhood experience moving to the [place] and being thrown into a new culture that she was not familiar with.  Client reported at times she finds herself having intrusive thoughts about the past and questioning why her parent treated her and her siblings the way that they did.  Client reported she and her husband recently took a vacation without the kids she still did not feel at ease.     Suicidal/Homicidal: Nowithout intent/plan  Therapist Response:  This began the appointment asking the client how she has been doing since last seen. Therapist used CBT to utilize active listening and positive emotional support with her thoughts and  feelings. Therapist used CBT to normalize the clients emotions. Therapist used CBT to engage with client and ask her about the origin where her current worries come from. Therapist assigned the client homework to practice self-care. Client was scheduled for next appointment.    Plan: Return again in 5 weeks.  Diagnosis: Depressive disorder, recurrent episode, moderate with anxious distress  Krystal Rhymes Leighann Amadon, LCSW 10/23/2020

## 2020-11-02 ENCOUNTER — Other Ambulatory Visit: Payer: Self-pay

## 2020-11-02 ENCOUNTER — Ambulatory Visit (HOSPITAL_COMMUNITY)
Admission: EM | Admit: 2020-11-02 | Discharge: 2020-11-02 | Disposition: A | Discharge status: Home / Self Care | Payer: No Payment, Other | Attending: Student | Admitting: Student

## 2020-11-02 DIAGNOSIS — Z635 Disruption of family by separation and divorce: Secondary | ICD-10-CM | POA: Insufficient documentation

## 2020-11-02 DIAGNOSIS — Z79899 Other long term (current) drug therapy: Secondary | ICD-10-CM | POA: Insufficient documentation

## 2020-11-02 DIAGNOSIS — Z9152 Personal history of nonsuicidal self-harm: Secondary | ICD-10-CM | POA: Insufficient documentation

## 2020-11-02 DIAGNOSIS — F419 Anxiety disorder, unspecified: Secondary | ICD-10-CM | POA: Insufficient documentation

## 2020-11-02 DIAGNOSIS — Z20822 Contact with and (suspected) exposure to covid-19: Secondary | ICD-10-CM | POA: Insufficient documentation

## 2020-11-02 DIAGNOSIS — Z560 Unemployment, unspecified: Secondary | ICD-10-CM | POA: Insufficient documentation

## 2020-11-02 DIAGNOSIS — F332 Major depressive disorder, recurrent severe without psychotic features: Secondary | ICD-10-CM | POA: Insufficient documentation

## 2020-11-02 DIAGNOSIS — R45851 Suicidal ideations: Secondary | ICD-10-CM | POA: Insufficient documentation

## 2020-11-02 LAB — POCT URINE DRUG SCREEN - MANUAL ENTRY (I-SCREEN)
POC Amphetamine UR: NOT DETECTED
POC Buprenorphine (BUP): NOT DETECTED
POC Cocaine UR: NOT DETECTED
POC Marijuana UR: NOT DETECTED
POC Methadone UR: NOT DETECTED
POC Methamphetamine UR: NOT DETECTED
POC Morphine: NOT DETECTED
POC Oxazepam (BZO): NOT DETECTED
POC Oxycodone UR: NOT DETECTED
POC Secobarbital (BAR): NOT DETECTED

## 2020-11-02 LAB — LIPID PANEL
Cholesterol: 222 mg/dL — ABNORMAL HIGH (ref 0–200)
HDL: 47 mg/dL (ref >40)
LDL Cholesterol: 141 mg/dL — ABNORMAL HIGH (ref 0–99)
Total CHOL/HDL Ratio: 4.7 RATIO
Triglycerides: 168 mg/dL — ABNORMAL HIGH (ref <150)
VLDL: 34 mg/dL (ref 0–40)

## 2020-11-02 LAB — CBC WITH DIFFERENTIAL/PLATELET
Abs Immature Granulocytes: 0.03 10*3/uL (ref 0.00–0.07)
Basophils Absolute: 0 10*3/uL (ref 0.0–0.1)
Basophils Relative: 0 %
Eosinophils Absolute: 0 10*3/uL (ref 0.0–0.5)
Eosinophils Relative: 1 %
HCT: 42.6 % (ref 36.0–46.0)
Hemoglobin: 14.4 g/dL (ref 12.0–15.0)
Immature Granulocytes: 0 %
Lymphocytes Relative: 25 %
Lymphs Abs: 1.8 10*3/uL (ref 0.7–4.0)
MCH: 29 pg (ref 26.0–34.0)
MCHC: 33.8 g/dL (ref 30.0–36.0)
MCV: 85.7 fL (ref 80.0–100.0)
Monocytes Absolute: 0.4 10*3/uL (ref 0.1–1.0)
Monocytes Relative: 5 %
Neutro Abs: 5.1 10*3/uL (ref 1.7–7.7)
Neutrophils Relative %: 69 %
Platelets: 348 10*3/uL (ref 150–400)
RBC: 4.97 MIL/uL (ref 3.87–5.11)
RDW: 12.7 % (ref 11.5–15.5)
WBC: 7.4 10*3/uL (ref 4.0–10.5)
nRBC: 0 % (ref 0.0–0.2)

## 2020-11-02 LAB — COMPREHENSIVE METABOLIC PANEL
ALT: 40 U/L (ref 0–44)
AST: 32 U/L (ref 15–41)
Albumin: 4.1 g/dL (ref 3.5–5.0)
Alkaline Phosphatase: 57 U/L (ref 38–126)
Anion gap: 11 (ref 5–15)
BUN: 12 mg/dL (ref 6–20)
CO2: 23 mmol/L (ref 22–32)
Calcium: 9.6 mg/dL (ref 8.9–10.3)
Chloride: 101 mmol/L (ref 98–111)
Creatinine, Ser: 0.59 mg/dL (ref 0.44–1.00)
GFR, Estimated: >60 mL/min (ref >60)
Glucose, Bld: 121 mg/dL — ABNORMAL HIGH (ref 70–99)
Potassium: 3.7 mmol/L (ref 3.5–5.1)
Sodium: 135 mmol/L (ref 135–145)
Total Bilirubin: 0.7 mg/dL (ref 0.3–1.2)
Total Protein: 7.6 g/dL (ref 6.5–8.1)

## 2020-11-02 LAB — RESP PANEL BY RT-PCR (FLU A&B, COVID) ARPGX2
Influenza A by PCR: NEGATIVE
Influenza B by PCR: NEGATIVE
SARS Coronavirus 2 by RT PCR: NEGATIVE

## 2020-11-02 LAB — POCT PREGNANCY, URINE
Preg Test, Ur: NEGATIVE
Preg Test, Ur: NEGATIVE

## 2020-11-02 LAB — POC SARS CORONAVIRUS 2 AG: SARSCOV2ONAVIRUS 2 AG: NEGATIVE

## 2020-11-02 LAB — POC SARS CORONAVIRUS 2 AG -  ED: SARS Coronavirus 2 Ag: NEGATIVE

## 2020-11-02 LAB — HEMOGLOBIN A1C
Hgb A1c MFr Bld: 6.4 % — ABNORMAL HIGH (ref 4.8–5.6)
Mean Plasma Glucose: 137 mg/dL

## 2020-11-02 LAB — TSH: TSH: 0.642 u[IU]/mL (ref 0.350–4.500)

## 2020-11-02 MED ORDER — TRAZODONE HCL 50 MG PO TABS: 50.0000 mg | ORAL_TABLET | Freq: Every evening | ORAL | Status: DC | PRN

## 2020-11-02 MED ORDER — HYDROXYZINE HCL 25 MG PO TABS: 25.0000 mg | ORAL_TABLET | Freq: Three times a day (TID) | ORAL | 0 refills | Status: DC | PRN

## 2020-11-02 MED ORDER — ACETAMINOPHEN 325 MG PO TABS: 650.0000 mg | ORAL_TABLET | Freq: Four times a day (QID) | ORAL | Status: DC | PRN

## 2020-11-02 MED ORDER — ALUM & MAG HYDROXIDE-SIMETH 200-200-20 MG/5ML PO SUSP: 30.0000 mL | ORAL | Status: DC | PRN

## 2020-11-02 MED ORDER — HYDROXYZINE HCL 25 MG PO TABS: 25.0000 mg | ORAL_TABLET | Freq: Three times a day (TID) | ORAL | Status: DC | PRN

## 2020-11-02 MED ORDER — MAGNESIUM HYDROXIDE 400 MG/5ML PO SUSP: 30.0000 mL | Freq: Every day | ORAL | Status: DC | PRN

## 2020-11-02 NOTE — Discharge Instructions (Addendum)
Patient is instructed prior to discharge to: Take all medications as prescribed by his/her mental healthcare provider. Report any adverse effects and or reactions from the medicines to his/her outpatient provider promptly. Patient has been instructed & cautioned: To not engage in alcohol and or illegal drug use while on prescription medicines. In the event of worsening symptoms, patient is instructed to call the crisis hotline, 911 and or go to the nearest ED for appropriate evaluation and treatment of symptoms. To follow-up with his/her primary care provider for your other medical issues, concerns and or health care needs.   Patient is instructed prior to discharge to: Take all medications as prescribed by his/her mental healthcare provider. Report any adverse effects and or reactions from the medicines to his/her outpatient provider promptly. Patient has been instructed & cautioned: To not engage in alcohol and or illegal drug use while on prescription medicines. In the event of worsening symptoms, patient is instructed to call the crisis hotline, 911 and or go to the nearest ED for appropriate evaluation and treatment of symptoms. To follow-up with his/her primary care provider for your other medical issues, concerns and or health care needs.    

## 2020-11-02 NOTE — ED Notes (Signed)
Pt A&O x 4, presents with suicidal ideations, plan to cut wrist with razor.  Family stressors noted. Denies hI ro AVH.  Skin search completed, no distress noted.  Calm & cooperative.  Monitoring for safety.

## 2020-11-02 NOTE — Progress Notes (Signed)
AVS reviewed with patient and prescription given.  All questions answered.  Patient verbalized understanding of information presented.  All belongings returned. Ambulated to lobby independently without issue.  Discharged in stable condition; no acute distress noted.

## 2020-11-02 NOTE — Progress Notes (Signed)
Patient requesting to discharge.  Provider notified.
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
## 2020-11-02 NOTE — Progress Notes (Signed)
   11/02/20 0220  [facility] Triage Screening (Walk-ins at [facility] only)  How Did You Hear About [place]? Self  What Is the Reason for Your Visit/Call Today? Krystal Glass is a 30 year old female presenting voluntary to [facility] due to SI with plan to cut wrist. Patient denied HI and psychosis. Patient reported onset of depression and anxiety since high school. Patient reported current stressors/triggers of SI includes marital discord, involving distrustful husbands addiction to pornography and its negative effects on their marriage. Patient reported tonight the truth of ongoing addiction was uncovered. Patient reported going to bathroom grabbing razor with intent to commit suicide by cutting herself. Patient reported "I feel like someone died, the past 10 years of being married was not real, a veil was lifted, felt like I had a hole in my chest, I couldn't breathe". Patient then reported going and handing child to husband and having a "meltdown". Patient denied prior suicide attempts and psych inpatient treatment. Patient reported self-harming behaviors in high school of superficial cutting and burning self. Patient is currently seeing Vernelle Emerald for outpatient therapy at [facility]. Patient does not have psychiatrist. Patient denied being prescribed any psych medications. Patient currently resides with husband and 2 children (1 y/o and 74 y/o). Patient reports marital discord, as listed above. Patient has been married for 10 years. Patient reported husband does have a gun in the home which is locked away and that she does not have access to gun. Patient was tearful and cooperative during assessment.  How Long Has This Been Causing You Problems? <Week  Have You Recently Had Any Thoughts About Hurting Yourself? Yes  How long ago did you have thoughts about hurting yourself? "few hours ago"  Are You Planning to Commit Suicide/Harm Yourself At This time? No  Have you Recently Had Thoughts About Hurting Someone Karolee Ohs? No  Are You  Planning To Harm Someone At This Time? No  Are you currently experiencing any auditory, visual or other hallucinations? No  Have You Used Any Alcohol or Drugs in the Past 24 Hours? No  Do you have any current medical co-morbidities that require immediate attention? No  Clinician description of patient physical appearance/behavior: casual/normal  What Do You Feel Would Help You the Most Today? Treatment for Depression or other mood problem;Medication(s)  If access to [facility] was not available, would you have sought care in the Emergency Department? Yes  Determination of Need Urgent (48 hours)  Options For Referral Medication Management;Outpatient Therapy
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
## 2020-11-02 NOTE — ED Provider Notes (Signed)
FBC/OBS ASAP Discharge Summary  Date and Time: 11/02/2020 9:32 PM  Name: Krystal Glass  MRN:  [id number]   Discharge Diagnoses:  Final diagnoses:  Severe episode of recurrent major depressive disorder, without psychotic features (HCC)  Anxiety    Subjective: Krystal Glass, 30 y.o., female patient seen face to face by this provider, chart reviewed and discussed with treatment team and Dr. Bronwen Betters on 11/02/20.    On today's assessment patient is fairly groomed.  She makes good eye contact.  She is calm/cooperative, alert/oriented x 4, with pleasant affect.she does not appear to be responding to internal/external stimuli.   Patient reports a history of depression and anxiety.  States the trigger for her stress and anxiety is her husband.  Patient discussed how her husband is unfaithful and lies to her consistently.  Reports when she is discharged her husband is going to move out.   Denies suicidal ideations at this time.  States yesterday she was very distraught after an argument with her spouse.  Patient contracts for safety.  Denies access to firearms/weapons.  States she would not actually attempt to kill herself because "I would not do that to my children I love them too much".  States, "all I want to do now is go home and love on my children".  Denies homicidal ideations.  Denies auditory and visual hallucinations.  Denies delusional thoughts and paranoia.  Patient is willing to engage in therapy.  Discussed [facility] behavioral health outpatient services open access walk-in hours also discussed PHP program.  Referral was made. Reassurance, support, and encouragement provided.   Collateral: Feliz Beam spouse.  States patient does not have access to firearms/weapons.  Reports he removed the firearm from their home.  States there are no large amounts of medications or any items that patient could possibly hurt herself with.  Spouse has no immediate safety concerns with patient returning  home.  Stay Summary:   Krystal Glass was admitted to [facility] Continuous Assessment. Krystal Glass was discharged with a 1 month prescription for hydroxyzine 25 mg p.o. 3 times daily as needed for anxiety.  An outpatient psychiatric resources.  Krystal Glass's improvement was monitored by continuous assessment/observation and her report of symptom reduction.  Her emotional and mental status was also monitored by staff.  She was evaluated for stability and plans for continued recovery upon discharge.  Krystal Glass will follow up with the services as listed below under Follow up Information.     Upon completion of this admission the Krystal Glass was both mentally and medically stable for discharge denying suicidal/homicidal ideation, auditory/visual/tactile hallucinations, delusional thoughts and paranoia.     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 10/12/2015   Closed displaced fracture of left clavicle 10/12/2015   Closed displaced segmental fracture of shaft of left humerus 10/09/2015   Gestational diabetes    PCOS (polycystic ovarian syndrome)    Postpartum depression     Past Surgical History:  Procedure Laterality Date   ORIF HUMERUS FRACTURE Left 10/10/2015   Procedure: OPEN REDUCTION INTERNAL FIXATION (ORIF) LEFT HUMERUS AND CLAVICLE FRACTURE;  Surgeon: Myrene Galas, MD;  Location: [facility] OR;  Service: Orthopedics;  Laterality: Left;   Family History:  Family History  Problem Relation Age of Onset   Hypothyroidism Mother    Family Psychiatric History: Unknown Social History:  Social History   Substance and Sexual Activity  Alcohol Use No     Social History   Substance and Sexual Activity  Drug Use No    Social History   Socioeconomic History   Marital status: Married    Spouse name: Not on file    Number of children: Not on file   Years of education: Not on file   Highest education level: Not on file  Occupational History   Not on file  Tobacco Use   Smoking status: Never   Smokeless tobacco: Never  Vaping Use   Vaping Use: Never used  Substance and Sexual Activity   Alcohol use: No   Drug use: No   Sexual activity: Yes    Birth control/protection: None  Other Topics Concern   Not on file  Social History Narrative   Not on file   Social Determinants of Health   Financial Resource Strain: Not on file  Food Insecurity: Not on file  Transportation Needs: Not on file  Physical Activity: Not on file  Stress: Not on file  Social Connections: Not on file   SDOH:  SDOH Screenings   Alcohol Screen: Not on file  Depression (PHQ2-9): Medium Risk   PHQ-2 Score: 19  Financial Resource Strain: Not on file  Food Insecurity: Not on file  Housing: Not on file  Physical Activity: Not on file  Social Connections: Not on file  Stress: Not on file  Tobacco Use: Not on file  Transportation Needs: Not on file    Tobacco Cessation:  N/A, patient does not currently use tobacco products  Current Medications:  No current facility-administered medications for this encounter.   Current Outpatient Medications  Medication Sig Dispense Refill   Multiple Vitamin (MULTIVITAMIN WITH MINERALS) TABS tablet Take 1 tablet by mouth daily.     hydrOXYzine (ATARAX/VISTARIL) 25 MG tablet Take 1 tablet (25 mg total) by mouth 3 (three) times daily as needed for anxiety. 90 tablet 0    PTA Medications: (Not in a hospital admission)   Musculoskeletal  Strength & Muscle Tone: within normal limits Gait & Station: normal Patient leans: N/A  Psychiatric Specialty Exam  Presentation  General Appearance: Appropriate for Environment; Fairly Groomed  Eye Contact:Good  Speech:Clear and Coherent; Normal Rate  Speech Volume:Normal  Handedness:Right   Mood and Affect  Mood:Anxious;  Depressed  Affect:Congruent   Thought Process  Thought Processes:Coherent  Descriptions of Associations:Intact  Orientation:Full (Time, Place and Person)  Thought Content:Logical  Diagnosis of Schizophrenia or Schizoaffective disorder in past: No    Hallucinations:Hallucinations: None  Ideas of Reference:None  Suicidal Thoughts:Suicidal Thoughts: No  Homicidal Thoughts:Homicidal Thoughts: No   Sensorium  Memory:Immediate Good; Recent Good; Remote Good  Judgment:Fair  Insight:Fair   Executive Functions  Concentration:Good  Attention Span:Good  Recall:Good  Fund of Knowledge:Good  Language:Good   Psychomotor Activity  Psychomotor Activity:Psychomotor Activity: Normal   Assets  Assets:Communication Skills; Desire for Improvement; Financial Resources/Insurance; Housing; Physical Health; Resilience; Social Support; Transportation   Sleep  Sleep:Sleep: Good Number of Hours of Sleep: 7   Nutritional Assessment (For OBS and FBC admissions only) Has the patient had a weight loss or gain of 10 pounds or more in the last 3 months?: No Has the patient had a decrease in food intake/or appetite?: Yes Does the patient have dental problems?: No Does the patient have eating habits or behaviors that may be indicators of an eating disorder including binging or inducing vomiting?: No Has the patient recently  lost weight without trying?: 0 Has the patient been eating poorly because of a decreased appetite?: 1 Malnutrition Screening Tool Score: 1   Physical Exam  Physical Exam Vitals and nursing note reviewed.  Constitutional:      General: She is not in acute distress.    Appearance: Normal appearance. She is not ill-appearing.  HENT:     Head: Normocephalic.  Eyes:     General:        Right eye: No discharge.        Left eye: No discharge.     Pupils: Pupils are equal, round, and reactive to light.  Cardiovascular:     Rate and Rhythm: Normal rate.   Pulmonary:     Effort: Pulmonary effort is normal.  Musculoskeletal:        General: Normal range of motion.     Cervical back: Normal range of motion.  Skin:    Coloration: Skin is not jaundiced or pale.  Neurological:     Mental Status: She is alert and oriented to person, place, and time.  Psychiatric:        Attention and Perception: Attention normal.        Mood and Affect: Mood is anxious and depressed.        Speech: Speech normal.        Behavior: Behavior normal. Behavior is cooperative.        Thought Content: Thought content normal.        Cognition and Memory: Cognition normal.        Judgment: Judgment is impulsive.   Review of Systems  Constitutional: Negative.   HENT: Negative.    Eyes: Negative.   Respiratory: Negative.    Cardiovascular: Negative.   Musculoskeletal: Negative.   Skin: Negative.   Neurological: Negative.   Psychiatric/Behavioral:  Positive for depression. The patient is nervous/anxious.   Blood pressure 126/67, pulse 72, temperature 98.2 F (36.8 C), temperature source Oral, resp. rate 16, SpO2 100 %, currently breastfeeding. There is no height or weight on file to calculate BMI.  Demographic Factors:  Adolescent or young adult and Caucasian  Loss Factors: NA  Historical Factors: Impulsivity  Risk Reduction Factors:   Responsible for children under 57 years of age, Sense of responsibility to family, Positive social support, Positive therapeutic relationship, and Positive coping skills or problem solving skills  Continued Clinical Symptoms:  Severe Anxiety and/or Agitation Depression:   Impulsivity  Cognitive Features That Contribute To Risk:  None    Suicide Risk:  Minimal: No identifiable suicidal ideation.  Patients presenting with no risk factors but with morbid ruminations; may be classified as minimal risk based on the severity of the depressive symptoms  Plan Of Care/Follow-up recommendations:  Activity:  AS TOLERATED   Diet:  REGULAR   Disposition:   Discharge patient   30-day prescription provided for hydroxyzine 25 mg p.o. 3 times daily as needed for anxiety.  Referral made to [facility] behavioral health outpatient services on the second floor.  Also provided open access walk-in hours.  No evidence of imminent risk to self or others at present.    Patient does not meet criteria for psychiatric inpatient admission. Discussed crisis plan, support from social network, calling 911, coming to the Emergency Department, and calling Suicide Hotline.   Ardis Hughs, NP 11/02/2020, 9:32 PM

## 2020-11-02 NOTE — Progress Notes (Signed)
Patient denied SI, HI, AVH at this time.  Declined breakfast.

## 2020-11-03 ENCOUNTER — Telehealth (HOSPITAL_COMMUNITY): Payer: Self-pay | Admitting: Professional

## 2020-11-06 ENCOUNTER — Ambulatory Visit (INDEPENDENT_AMBULATORY_CARE_PROVIDER_SITE_OTHER): Payer: No Payment, Other | Admitting: Clinical

## 2020-11-06 ENCOUNTER — Other Ambulatory Visit: Payer: Self-pay

## 2020-11-06 ENCOUNTER — Ambulatory Visit (HOSPITAL_COMMUNITY): Payer: Self-pay | Admitting: Clinical

## 2020-11-06 DIAGNOSIS — F331 Major depressive disorder, recurrent, moderate: Secondary | ICD-10-CM

## 2020-11-10 NOTE — Progress Notes (Signed)
   THERAPIST PROGRESS NOTE  Session Time: 45 minutes  Participation Level: Active  Behavioral Response: CasualAlertDepressed  Type of Therapy: Individual Therapy  Treatment Goals addressed: Coping  Interventions: CBT and Supportive  Summary:  Krystal Glass is a 30 y.o. female who presents for scheduled session oriented x5, appropriately dressed, and friendly.  Client denied hallucinations and delusions.  Client reported on today she is doing fairly well since she was last seen she made a visit to the behavioral health urgent care.  Client reported she has had ongoing stressors with her marriage.  Client reported recently she found her husband subscribe to inappropriate accounts related to pornography and other females.  Client reported she had trusted that he would stop doing that since they have previously discussed that but found emails detailing how much money he has been spending on these accounts.  Client reported she became somewhat overwhelmed with irritability and sadness that on 11/02/2020 she drove herself to the urgent care.  Client reported since that time she told her husband that she feels like she needs a break and that he needs to make steps towards making changes or they will separate.  Client reported her has been has agreed to his own individual counseling which she is hoping will help their marriage to improve.  Client reported she has things calm down from the situation of distress but is now going to also present to him the idea again of attending marriage counseling.     Suicidal/Homicidal: Nowithout intent/plan  Therapist Response:  Therapist began the appointment asking client how she has been doing since last seen. Therapist used CBT to utilize active listening and positive emotional support. Therapist used CBT to normalize clients reaction within normal levels to the stressor described. Therapist used CBT to discuss coping skills to help with distress  tolerance. Therapist assigned the client homework to discuss with her husband finding counselors for marriage counseling and to make an appointment somewhere before next appointment for therapy. Client was scheduled for next appointment.     Plan: Return again in 5 weeks.  Diagnosis: Major depressive disorder, recurrent episode, moderate with anxious distress   Neena Rhymes Ashlynne Shetterly, LCSW 11/10/2020

## 2020-11-20 ENCOUNTER — Telehealth (HOSPITAL_COMMUNITY): Payer: Self-pay | Admitting: Professional

## 2020-11-20 ENCOUNTER — Ambulatory Visit (HOSPITAL_COMMUNITY): Payer: No Payment, Other

## 2020-11-29 ENCOUNTER — Ambulatory Visit (INDEPENDENT_AMBULATORY_CARE_PROVIDER_SITE_OTHER): Payer: No Payment, Other | Admitting: Clinical

## 2020-11-29 ENCOUNTER — Other Ambulatory Visit: Payer: Self-pay

## 2020-11-29 DIAGNOSIS — F331 Major depressive disorder, recurrent, moderate: Secondary | ICD-10-CM

## 2020-11-29 NOTE — Progress Notes (Signed)
   THERAPIST PROGRESS NOTE  Session Time: 50 minutes  Participation Level: Active  Behavioral Response: CasualAlertAnxious  Type of Therapy: Individual Therapy  Treatment Goals addressed: Coping  Interventions: CBT and Supportive  Summary:  Cyanna Neace is a 30 y.o. female who presents for the scheduled appointment oriented x5, appropriately dressed, and friendly.  Client denied hallucinations and delusions.  Client reported on today she has been "neutral".  Client reported her 62-year-old son was kicked out of his private preschool because the director told her he was "a danger to the school".  Client reported she felt that was a dramatic to describe her signs behaviors.  Client reported her son has been someone before but it was usually due to him not being understood with what he needed.  Client reported she did meet with [facility] schools to have him formally evaluated and start the process of having him placed in specialized schooling settings.  Client reported otherwise she and her husband continue to cohabitate in the same house.  Client reported he is attending his own therapy sessions and is happy that he is doing that but she is still sorting through how she wants to move forward in the relationship.  Client reported she just wants to be "soft" but has always felt like she needed to have her guard up with her husband because of his disrespectful behaviors of viewing other women.  Client reported she has had a lot of anxiety due to feeling overwhelmed by being at home with the kids and arranging services for her son. Client reported when she attempts to do activities with her kids in the family outside of the home she struggles to see her son when he has a Psychologist, counselling.  Client reported his episodes are usually followed by her visually crying and or screaming as well.  Client stated "I love my kids but I also have grieved the idea of the cage she thought she have". Client reported she  struggles with having guilty feelings about that thought and learning how to confidentiality.    Suicidal/Homicidal: Nowithout intent/plan  Therapist Response:  Therapist began the session asking the client how she has been doing since last seen. Therapist used CBT to utilize active listening and positive emotional support. Therapist used CBT ask the client to identify source of her anxious and depressed thoughts and feelings. Therapist used CBT to normalize the clients emotions. Therapist used CBT to engage with the client to identify the source of her thoughts and behaviors from childhood. Therapist used CBT to engage with the client to reframe her negative thoughts about herself. Therapist assigned the client homework to practice positive affirmations and watching educational videos on youtube for mothers learning to cope with children of special needs. Client was scheduled for next appointments.    Plan: Return again in 4 weeks.  Diagnosis: MDD, recurrent episode, moderate with anxious distress    Neena Rhymes Odelle Kosier, LCSW 11/29/2020

## 2020-12-13 ENCOUNTER — Ambulatory Visit (INDEPENDENT_AMBULATORY_CARE_PROVIDER_SITE_OTHER): Payer: No Payment, Other | Admitting: Clinical

## 2020-12-13 ENCOUNTER — Other Ambulatory Visit: Payer: Self-pay

## 2020-12-13 DIAGNOSIS — F331 Major depressive disorder, recurrent, moderate: Secondary | ICD-10-CM | POA: Diagnosis not present

## 2020-12-15 NOTE — Progress Notes (Signed)
   THERAPIST PROGRESS NOTE  Session Time: 45 minutes  Participation Level: Active  Behavioral Response: CasualAlertEuthymic  Type of Therapy: Individual Therapy  Treatment Goals addressed: Coping  Interventions: CBT and Supportive  Summary:  Krystal Glass is a 30 y.o. female who presents for the scheduled session oriented times five, appropriately dressed, and friendly. Client denied hallucinations and delusions. Client reported on today she is doing fairly well. Client reported she has been working on learning to be self aware. Client reported she has been working on learning how to cope with her anxiety and catch it early on. Client reported she has been working on detecting her early warning signs such as feeling jittery and jumping from one tasks to the next. Client reported by history she has had black and white thinking with no room to reason in between. Client discussed with the therapist her childhood trauma of immigrating to the [place] abruptly. Client reported feeling depressed and cutting. Client reported she is coming into accepting her parents will not acknowledge things to her pleasing about how they have affected her mental health. Client reported as she is working on finding her balance with motherhood and helping her son has triggered thoughts of childhood.     Suicidal/Homicidal: Nowithout intent/plan  Therapist Response:  Therapist began the session asking how she has been doing since last seen. Therapist used CBT to utilize active listening and positive emotional support. Therapist used CBT to normalize the client emotional reaction to intrusive thoughts from childhood and initialing change talk for a positive perspective. Therapist assigned the client homework to continue practicing mindfulness/ DBT skills to minimize unwanted emotional reactions. Client was scheduled for next appointment.    Plan: Return again in 5 weeks.  Diagnosis: Major depressive disorder,  recurrent episode, moderate with anxious distress    Neena Rhymes Jaylen Claude, LCSW 12/13/2020

## 2021-02-06 ENCOUNTER — Ambulatory Visit (HOSPITAL_COMMUNITY): Payer: No Payment, Other | Admitting: Clinical

## 2021-02-19 ENCOUNTER — Encounter (INDEPENDENT_AMBULATORY_CARE_PROVIDER_SITE_OTHER): Payer: Self-pay

## 2021-02-20 ENCOUNTER — Encounter (HOSPITAL_COMMUNITY): Payer: Self-pay

## 2021-02-20 ENCOUNTER — Ambulatory Visit (HOSPITAL_COMMUNITY): Payer: No Payment, Other | Admitting: Clinical

## 2021-04-26 ENCOUNTER — Ambulatory Visit (INDEPENDENT_AMBULATORY_CARE_PROVIDER_SITE_OTHER): Payer: No Payment, Other | Admitting: Clinical

## 2021-04-26 DIAGNOSIS — F331 Major depressive disorder, recurrent, moderate: Secondary | ICD-10-CM

## 2021-04-27 NOTE — Progress Notes (Signed)
? ?THERAPIST PROGRESS NOTE ?Virtual Visit via Video Note ? ?I connected with Krystal Glass on 04/26/2021 at  2:00 PM EDT by a video enabled telemedicine application and verified that I am speaking with the correct person using two identifiers. ? ?Location: ?Patient: home ?Provider: office ?  ?I discussed the limitations of evaluation and management by telemedicine and the availability of in person appointments. The patient expressed understanding and agreed to proceed. ? ? ?Follow Up Instructions:  ?I discussed the assessment and treatment plan with the patient. The patient was provided an opportunity to ask questions and all were answered. The patient agreed with the plan and demonstrated an understanding of the instructions. ?  ?The patient was advised to call back or seek an in-person evaluation if the symptoms worsen or if the condition fails to improve as anticipated. ? ? ?Session Time: 45 minutes ? ?Participation Level: Active ? ?Behavioral Response: CasualAlertAnxious ? ?Type of Therapy: Individual Therapy ? ?Treatment Goals addressed: will participate in 80% of assigned homework ? ?ProgressTowards Goals: Progressing ? ?Interventions: CBT ? ?Summary:  ?Krystal Glass is a 31 y.o. female who presents for the scheduled session oriented times five, appropriately dressed, and friendly. Client denied hallucinations and delusions. ?Client reported on today she has been doing fairly well. Client reported on today she took her youngest son to his first play date. Client reported she was nervous about how he would react being in a new environment and new people. Client reported he did very well and they enjoyed the afternoon. Client reported otherwise she has been reflecting on a previous friendship she had with a female best friend. Client reported it's like a itch she can't scratch because in recent years she has tried to reach out to him and he doe snot respond. Client reported it has been difficult because she has  been trying to figure out what she may have done or what would have caused him to stop talking to her. Client reported in addition she has been working with her husband on evaluating their boundaries with communication for what is and is not appropriate. Client reported she has been engaging in previously suggested therapy homework to utilize a planner as a method to help organize her weeks appointments for herself and her children's doctors appointments. Client reported she has been on and off consistent but does see the benefit of have organizing when she stays consistent with use of it.  ?Evidence of progress towards goal: client reported she has used a planner approximately every other week and with consistent use 7 out of 7 days a week it has helped to reduce her anxiety by trying to remember from memory what needs to be done. Client identified that her "going with the flow" without structure stems from how she and her siblings were raised by parents which instilled a feeling on anxiousness. Client also reported she has been going to the gym at least 2-3x per week which is helping to boost her confidence.  ? ? ?Suicidal/Homicidal: Nowithout intent/plan ? ?Therapist Response:  ?Therapist began the appointment asking the client how she has been doing since last seen. ?Therapist used CBT to engage using active listening and positive emotional support. ?Therapist used CBT to engage and ask the client to identify unresolved conflicts that are currently contributing to anxiety symptoms. ?Therapist used CBT to engage and discuss anxiety meditating messages that she has as automatic thoughts and brainstorming reality based cognitiv emessages. ?Therapist used CBT ask the client to identify her  progress with frequency of use with coping skills with continued practice in her daily activity.    ?Therapist assigned the client homework to practice thought stopping and challenging herself to continue practicing organization  and incorporating exercise. ?Client was scheduled for next appointment. ? ? ? ?Plan: Return again in 5 weeks. ? ?Diagnosis: major depressive disorder, recurrent episode, moderate with anxious distress ? ?Collaboration of Care: Patient refused AEB none identified by the client at this time. ? ?Patient/Guardian was advised Release of Information must be obtained prior to any record release in order to collaborate their care with an outside provider. Patient/Guardian was advised if they have not already done so to contact the registration department to sign all necessary forms in order for [place] to release information regarding their care.  ? ?Consent: Patient/Guardian gives verbal consent for treatment and assignment of benefits for services provided during this visit. Patient/Guardian expressed understanding and agreed to proceed.  ? ?Neena Rhymes Fawaz Borquez, LCSW ?04/26/2021 ? ?

## 2021-04-27 NOTE — Plan of Care (Signed)
Client was scheduled for next appointment. ?

## 2021-05-15 ENCOUNTER — Encounter (HOSPITAL_COMMUNITY): Payer: Self-pay

## 2021-05-15 ENCOUNTER — Ambulatory Visit (INDEPENDENT_AMBULATORY_CARE_PROVIDER_SITE_OTHER): Payer: No Payment, Other | Admitting: Clinical

## 2021-05-15 DIAGNOSIS — F331 Major depressive disorder, recurrent, moderate: Secondary | ICD-10-CM | POA: Diagnosis not present

## 2021-05-15 NOTE — Plan of Care (Signed)
Client was in agreement to the plan. ?

## 2021-05-15 NOTE — Progress Notes (Signed)
?THERAPIST PROGRESS NOTE ?Virtual Visit via Video Note ? ?I connected with [name] on 05/15/21 at  3:00 PM EDT by a video enabled telemedicine application and verified that I am speaking with the correct person using two identifiers. ? ?Location: ?Patient: home ?Provider: office ?  ?I discussed the limitations of evaluation and management by telemedicine and the availability of in person appointments. The patient expressed understanding and agreed to proceed. ? ? ?Follow Up Instructions: ?I discussed the assessment and treatment plan with the patient. The patient was provided an opportunity to ask questions and all were answered. The patient agreed with the plan and demonstrated an understanding of the instructions. ?  ?The patient was advised to call back or seek an in-person evaluation if the symptoms worsen or if the condition fails to improve as anticipated. ? ? ?Session Time: 45 minutes ? ?Participation Level: Active ? ?Behavioral Response: CasualAlertEuthymic ? ?Type of Therapy: Individual Therapy ? ?Treatment Goals addressed:  ? ?ProgressTowards Goals: Progressing ? ?Interventions: CBT and Solution Focused ? ?Summary:  ?Krystal Glass is a 31 y.o. female who presents for the scheduled session oriented times five, appropriately dressed, and friendly. Client denied hallucinations and delusions. ?Client reported on today she has been doing fairly well but experience some challenges. Client reported she feels like she has had "manic" like symptoms. Client reported feeling a sense of increased energy, difficulty focusing, starting and not finishing tasks. Client reported over the past week she strayed away from utilizing her planner and tried to remember to do things from memory. Client reported she was forgetful about doing things. Client reported starting tasks and not completing any of them so she felt unaccomplished. Client reported she has been having some negative feelings come up related to childhood  trauma. 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 Client reported she loves her parents but having feelings of anger thinking about how she had to grow up fast. Client reported its amazing to see them be sensitive to her children's needs and being supportive after disregarding how their actions affected her when she was younger. Client reported she has been working out consistently but has struggled with weight loss due to having PCOS. Client reported she will speak with her PCP about it soon. ?Evidence of progress towards goal:  client reported she has found that using the suggested homework of organizing her schedule to a planner 7 out of 7 days per week she feels more at ease. Client reported she has re=engaged in enjoyed activities of gardening and exercise at least 2x per week has been beneficial for her anxiety and depressive symptoms. ? ? ?Suicidal/Homicidal: Nowithout intent/plan ? ?Therapist Response:  ?Therapist began the appointment asking the client how she has been doing since last seen. ?Therapist used CBT to engage using active listening and positive emotional support towards her thoughts and feelings. ?Therapist used CBT to engage and ask the client to discuss challenges that have negatively impacted her anxiety and depression. ?Therapist used CBT to ask the client about childhood experiences that relate to her current symptoms. ?Therapist used CBT ask the client to identify her progress with frequency of use with coping skills with continued practice in her daily activity.    ?Therapist assigned the client homework to use her planner and engage in exercise 3x per week. ?Client was scheduled for next appointment. ? ? ? ?Plan: Return again in 5 weeks. ? ?Diagnosis: major depressive disorder, recurrent  episode, moderate with anxious distress ? ?Collaboration of Care: Patient refused AEB none requested by the client. ? ?Patient/Guardian was  advised Release of Information must be obtained prior to any record release in order to collaborate their care with an outside provider. Patient/Guardian was advised if they have not already done so to contact the registration department to sign all necessary forms in order for [place] to release information regarding their care.  ? ?Consent: Patient/Guardian gives verbal consent for treatment and assignment of benefits for services provided during this visit. Patient/Guardian expressed understanding and agreed to proceed.  ? ?Neena Rhymes Dolton Shaker, LCSW ?05/15/2021 ? ?

## 2021-05-16 NOTE — Plan of Care (Signed)
Client is in agreement to the plan. ?

## 2021-06-13 ENCOUNTER — Ambulatory Visit (INDEPENDENT_AMBULATORY_CARE_PROVIDER_SITE_OTHER): Payer: No Payment, Other | Admitting: Physician Assistant

## 2021-06-13 DIAGNOSIS — G479 Sleep disorder, unspecified: Secondary | ICD-10-CM

## 2021-06-13 DIAGNOSIS — R41840 Attention and concentration deficit: Secondary | ICD-10-CM

## 2021-06-13 DIAGNOSIS — F331 Major depressive disorder, recurrent, moderate: Secondary | ICD-10-CM | POA: Diagnosis not present

## 2021-06-13 MED ORDER — ESCITALOPRAM OXALATE 10 MG PO TABS: 10.0000 mg | ORAL_TABLET | Freq: Every day | ORAL | 1 refills | Status: DC

## 2021-06-13 MED ORDER — TRAZODONE HCL 50 MG PO TABS: 50.0000 mg | ORAL_TABLET | Freq: Every day | ORAL | 1 refills | Status: DC

## 2021-06-13 MED ORDER — ATOMOXETINE HCL 40 MG PO CAPS: 40.0000 mg | ORAL_CAPSULE | Freq: Every day | ORAL | 1 refills | Status: DC

## 2021-06-13 NOTE — Progress Notes (Addendum)
Psychiatric Initial Adult Assessment  ? ?Patient Identification: Krystal Kleinatiana Hartley ?MRN:  [id number] ?Date of Evaluation:  06/13/2021 ?Referral Source: Referred by license clinical social worker ?Chief Complaint:   ?Chief Complaint  ?Patient presents with  ? New Patient (Initial Visit)  ? ?Visit Diagnosis:  ?  ICD-10-CM   ?1. Major depressive disorder, recurrent episode, moderate with anxious distress (HCC)  F33.1   ?  ? ? ?History of Present Illness:   ? ?Krystal Glass is a 31 year old female with a past psychiatric history significant for depression and anxiety who [facility] to establish psychiatric care and for medication management. ? ?Patient reports that she has been seeing a therapist for the management of her mental health.  She reports having a mental health crisis back in September and was admitted for hospitalization but never set up follow-up.  Per chart review, [facility] due to worsening depression, anxiety, and suicidal ideations.  During the encounter, patient symptoms appeared to be attributed to her relationship with her husband of 10 years.  During the encounter, patient states that she, her husband betraying her trust multiple times during the course of her marriage and had cheated on her multiple times in the past.  Patient reports that she has a past history of postpartum depression stating that when she had her first child she experienced worsening depression and rage. ? ?Patient reports that she has been dealing with anxiety and depression for a long time.  Patient states that she experiences moments where she has heavy emotions especially during bad days.  Patient reports that she has difficulty managing her emotions along with stressors in her life.  In addition to her worsening mood, patient would like to speak with provider about ADHD.  Patient reports that she has been depressed since a  teenager.  She states that she was assessed by a therapist as a teenager while her mother was present.  She reports that the depression after her first child was born with severe.  Shortly after the birth of her first child, patient states that she was set up with a program where she had an in-home therapist; however, she eventually aged out of the program.  Patient depressive symptoms are characterized by the following: feelings of sadness, negative thoughts, worthlessness, and rumination over the bad things that occurred in her marriage.  Patient states that she is triggered easily and is often irritable. ? ?Patient states that when she experiences postpartum depression with her first child, she states that it was hard to connect with her first born child.  She states that she would often get annoyed and frustrated with her baby.  Patient states that she occasionally did not feel like she was a mother to her 2 children.  Patient also endorses fluctuating anxiety and rates her anxiety at 5-8 out of 10.  Patient's main stressor involves the behavior exhibited by her autistic son.  She reports that her son is easily triggered and will often throw himself into a rage due to sensory meltdown.  In regards to her attention/concentration, patient denies being forgetful.  She states that she does like to do a lot of things at once but never gets anything completed.  Patient endorses some absentmindedness, difficulty initiating tasks, and being impatient.  Patient states that she occasionally has to reread pages and has poor time management skills. ?Patient denies past history of hospitalization due to mental health.   ? ?  Patient endorses past history of suicide attempt that occurred at the age of 51.  During the time of her suicide attempt, patient was experiencing intense worthlessness and loneliness.  Patient endorses a past history of self-injurious behavior as a teenager via cutting herself.  A PHQ-9 screen was  performed with the patient scoring a 15.  A GAD-7 screen was also performed with the patient scoring a 15. ? ?Patient is alert and oriented x4, calm, cooperative, and fully engaged in conversation during the encounter.  Patient denies suicidal or homicidal ideations.  She further denies auditory or visual hallucinations and does not appear to be responding to internal/external stimuli.  Patient endorses fair sleep and receives on average 5 to 7 hours of sleep each night.  Patient endorses good appetite and eats on average 3 meals per day.  Patient endorses alcohol consumption occasionally.  Patient denies tobacco use and illicit drug use.  Patient states that she has used marijuana in the past. ? ?Associated Signs/Symptoms: ?Depression Symptoms:  depressed mood, ?anhedonia, ?hypersomnia, ?psychomotor agitation, ?psychomotor retardation, ?fatigue, ?feelings of worthlessness/guilt, ?difficulty concentrating, ?recurrent thoughts of death, ?anxiety, ?loss of energy/fatigue, ?weight loss, ?increased appetite, ?(Hypo) Manic Symptoms:  Distractibility, ?Elevated Mood, ?Flight of Ideas, ?Grandiosity, ?Impulsivity, ?Irritable Mood, ?Labiality of Mood, ?Anxiety Symptoms:  Agoraphobia, ?Excessive Worry, ?Obsessive Compulsive Symptoms:   Patient endorses intrusive thoughts, ?Social Anxiety, ?Psychotic Symptoms:   None ?PTSD Symptoms: ?Had a traumatic exposure:  Patient states that she and her family moved without warning to another country. Patient did not speak english when she moved to the [place] and was of school age. Patient re ?Had a traumatic exposure in the last month:  N/A ?Re-experiencing:  Flashbacks ?Intrusive Thoughts ?Hypervigilance:  Yes ?Hyperarousal:  Difficulty Concentrating ?Emotional Numbness/Detachment ?Increased Startle Response ?Irritability/Anger ?Avoidance:  Decreased Interest/Participation ?Foreshortened Future ? ?Past Psychiatric History:  ?Depression ?Anxiety ? ?Previous Psychotropic Medications: Yes   ? ?Substance Abuse History in the last 12 months:  Yes.   ? ?Consequences of Substance Abuse: ?Medical Consequences:  None ?Legal Consequences:  None ?Family Consequences:  None ?Blackouts:  None ?DT's: None ?Withdrawal Symptoms:   None ? ?Past Medical History:  ?Past Medical History:  ?Diagnosis Date  ? Acute radial nerve palsy of left upper extremity 10/12/2015  ? Closed displaced fracture of left clavicle 10/12/2015  ? Closed displaced segmental fracture of shaft of left humerus 10/09/2015  ? Gestational diabetes   ? PCOS (polycystic ovarian syndrome)   ? Postpartum depression   ?  ?Past Surgical History:  ?Procedure Laterality Date  ? ORIF HUMERUS FRACTURE Left 10/10/2015  ? Procedure: OPEN REDUCTION INTERNAL FIXATION (ORIF) LEFT HUMERUS AND CLAVICLE FRACTURE;  Surgeon: Myrene Galas, MD;  Location: [facility] OR;  Service: Orthopedics;  Laterality: Left;  ? ? ?Family Psychiatric History:  ?Patient denies a family history of psychiatric illness.  She reports there are no documents regarding family members having mental health due to parents disbelief around mental health. ? ?Family History:  ?Family History  ?Problem Relation Age of Onset  ? Hypothyroidism Mother   ? ? ?Social History:   ?Social History  ? ?Socioeconomic History  ? Marital status: Married  ?  Spouse name: Not on file  ? Number of children: Not on file  ? Years of education: Not on file  ? Highest education level: Not on file  ?Occupational History  ? Not on file  ?Tobacco Use  ? Smoking status: Never  ? Smokeless tobacco: Never  ?Vaping Use  ? Vaping Use:  Never used  ?Substance and Sexual Activity  ? Alcohol use: No  ? Drug use: No  ? Sexual activity: Yes  ?  Birth control/protection: None  ?Other Topics Concern  ? Not on file  ?Social History Narrative  ? Not on file  ? ?Social Determinants of Health  ? ?Financial Resource Strain: Not on file  ?Food Insecurity: Not on file  ?Transportation Needs: Not on file  ?Physical Activity: Not on file  ?Stress: Not  on file  ?Social Connections: Not on file  ? ? ?Additional Social History:  ?Patient is currently unemployed.  Patient endorses social support.  Patient states that she has housing and transportation. ? ?Allerg

## 2021-06-16 ENCOUNTER — Encounter (HOSPITAL_COMMUNITY): Payer: Self-pay | Admitting: Physician Assistant

## 2021-06-26 ENCOUNTER — Ambulatory Visit (INDEPENDENT_AMBULATORY_CARE_PROVIDER_SITE_OTHER): Payer: No Payment, Other | Admitting: Clinical

## 2021-06-26 DIAGNOSIS — F331 Major depressive disorder, recurrent, moderate: Secondary | ICD-10-CM | POA: Diagnosis not present

## 2021-06-26 NOTE — Progress Notes (Signed)
THERAPIST PROGRESS NOTE Virtual Visit via Video Note  I connected with Krystal Glass on 06/26/21 at  2:00 PM EDT by a video enabled telemedicine application and verified that I am speaking with the correct person using two identifiers.  Location: Patient: home Provider: office   I discussed the limitations of evaluation and management by telemedicine and the availability of in person appointments. The patient expressed understanding and agreed to proceed.   Follow Up Instructions: I discussed the assessment and treatment plan with the patient. The patient was provided an opportunity to ask questions and all were answered. The patient agreed with the plan and demonstrated an understanding of the instructions.   The patient was advised to call back or seek an in-person evaluation if the symptoms worsen or if the condition fails to improve as anticipated.   Session Time: 20 minutes  Participation Level: Active  Behavioral Response: CasualAlertAnxious  Type of Therapy: Individual Therapy  Treatment Goals addressed: client will complete 80% of assigned homework  ProgressTowards Goals: Progressing  Interventions: CBT and Supportive  Summary:  Krystal Glass is a 31 y.o. female who presents for the scheduled session oriented times five, appropriately dressed, and friendly. Client denied hallucinations and delusions. Client reported on today she has not been feeling well. Client reported today is the fourth day she has taken her prescribed medications. Client reported this morning she experienced nausea, sweating, stomach ache and dry mouth. Client could be seen visibly sweating during the appointment. Client reported no issue with suicidal ideations as a side effect. Client reported she does feel a change with her mood. Client reported for example her son has been having a tantrum/ melt down which usually would cause her to feel overly anxious. Client reported she has not been overly  emotional during his fit. Client reported she has also been able to sit with her older son and play with toys which was nice. Client reported not feeling down or happy exactly but also doesn't feel like herself. Client reported her mood has been stable. Client reported she needed to end the session early to rest. Evidence of progress towards goal:  client reported 3 side effects of her psychiatric medication.    Suicidal/Homicidal: Nowithout intent/plan  Therapist Response:  Therapist began the appointment asking the client how she has been doing since last seen. Therapist used CBT to engage using active listening and positive emotional support towards her thoughts and feelings. Therapist used CBT to engage and ask the client about compliance with new medication regimen and side effects. Therapist used CBT to ask the client open ended questions if any changes occur with her noted physical side effects. Therapist used CBT ask the client to identify her progress with frequency of use with coping skills with continued practice in her daily activity.    Therapist used CBT to encourage the client to notify her psychiatrist about her side effects if they become intolerable. Client was scheduled for next appointment.    Plan: Return again in 5 weeks.  Diagnosis: major depressive disorder, recurrent episode ,moderate with anxious distress  Collaboration of Care: Medication Management AEB Therapist informed the client I will contact her psychiatrist to inform him about her side effects.  Patient/Guardian was advised Release of Information must be obtained prior to any record release in order to collaborate their care with an outside provider. Patient/Guardian was advised if they have not already done so to contact the registration department to sign all necessary forms in order for  [place] to release information regarding their care.   Consent: Patient/Guardian gives verbal consent for treatment and  assignment of benefits for services provided during this visit. Patient/Guardian expressed understanding and agreed to proceed.   Neena Rhymes Katira Dumais, LCSW 06/26/2021

## 2021-06-29 NOTE — Plan of Care (Signed)
  Problem: Anxiety Disorder CCP Problem  1  Goal: LTG: Patient will score less than 5 on the Generalized Anxiety Disorder 7 Scale (GAD-7) Outcome: Progressing Goal: STG: Patient will participate in at least 80% of scheduled individual psychotherapy sessions Outcome: Progressing Goal: STG: Patient will complete at least 80% of assigned homework Outcome: Progressing Goal: STG: Patient will reduce frequency of avoidant behaviors by 50% as evidenced by self-report in therapy sessions Outcome: Progressing   

## 2021-07-17 ENCOUNTER — Telehealth (HOSPITAL_COMMUNITY): Payer: No Payment, Other | Admitting: Physician Assistant

## 2021-07-26 ENCOUNTER — Telehealth (INDEPENDENT_AMBULATORY_CARE_PROVIDER_SITE_OTHER): Payer: No Payment, Other | Admitting: Physician Assistant

## 2021-07-26 DIAGNOSIS — F331 Major depressive disorder, recurrent, moderate: Secondary | ICD-10-CM | POA: Diagnosis not present

## 2021-07-26 DIAGNOSIS — G479 Sleep disorder, unspecified: Secondary | ICD-10-CM

## 2021-07-26 DIAGNOSIS — R41840 Attention and concentration deficit: Secondary | ICD-10-CM

## 2021-07-29 ENCOUNTER — Encounter (HOSPITAL_COMMUNITY): Payer: Self-pay | Admitting: Physician Assistant

## 2021-07-29 MED ORDER — TRAZODONE HCL 50 MG PO TABS: 50.0000 mg | ORAL_TABLET | Freq: Every evening | ORAL | 1 refills | Status: DC | PRN

## 2021-07-29 MED ORDER — ATOMOXETINE HCL 40 MG PO CAPS: 40.0000 mg | ORAL_CAPSULE | Freq: Every day | ORAL | 2 refills | Status: DC

## 2021-07-29 MED ORDER — TRAZODONE HCL 50 MG PO TABS: 50.0000 mg | ORAL_TABLET | Freq: Every day | ORAL | 2 refills | Status: DC

## 2021-07-29 MED ORDER — ESCITALOPRAM OXALATE 10 MG PO TABS: 10.0000 mg | ORAL_TABLET | Freq: Every day | ORAL | 2 refills | Status: DC

## 2021-07-29 NOTE — Progress Notes (Cosign Needed)
BH MD/PA/NP OP Progress Note  Virtual Visit via Video Note  I connected with Krystal Glass on 07/29/21 at  3:30 PM EDT by a video enabled telemedicine application and verified that I am speaking with the correct person using two identifiers.  Location: Patient: Home Provider: Clinic   I discussed the limitations of evaluation and management by telemedicine and the availability of in person appointments. The patient expressed understanding and agreed to proceed.  Follow Up Instructions:  I discussed the assessment and treatment plan with the patient. The patient was provided an opportunity to ask questions and all were answered. The patient agreed with the plan and demonstrated an understanding of the instructions.   The patient was advised to call back or seek an in-person evaluation if the symptoms worsen or if the condition fails to improve as anticipated.  I provided 14 minutes of non-face-to-face time during this encounter.  Meta Hatchet, PA   07/29/2021 8:36 PM Krystal Glass  MRN:  [id number]  Chief Complaint:  Chief Complaint  Patient presents with   Follow-up   Medication Refill   HPI:   Krystal Glass  Visit Diagnosis:    ICD-10-CM   1. Major depressive disorder, recurrent episode, moderate with anxious distress (HCC)  F33.1 escitalopram (LEXAPRO) 10 MG tablet    traZODone (DESYREL) 50 MG tablet    DISCONTINUED: traZODone (DESYREL) 50 MG tablet    2. Sleep disturbances  G47.9 traZODone (DESYREL) 50 MG tablet    DISCONTINUED: traZODone (DESYREL) 50 MG tablet    3. 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 10/12/2015   Closed displaced fracture of left clavicle  10/12/2015   Closed displaced segmental fracture of shaft of left humerus 10/09/2015   Gestational diabetes    PCOS (polycystic ovarian syndrome)    Postpartum depression     Past Surgical History:  Procedure Laterality Date   ORIF HUMERUS FRACTURE Left 10/10/2015   Procedure: OPEN REDUCTION INTERNAL FIXATION (ORIF) LEFT HUMERUS AND CLAVICLE FRACTURE;  Surgeon: Myrene Galas, MD;  Location: [facility] OR;  Service: Orthopedics;  Laterality: Left;    Family Psychiatric History:  Patient denies a family history of psychiatric illness.  She reports there are no documents regarding family members having mental health due to parents disbelief around mental health.  Family History:  Family History  Problem Relation Age of Onset   Hypothyroidism Mother     Social History:  Social History   Socioeconomic History   Marital status: Married    Spouse name: Not on file   Number of children: Not on file   Years of education: Not on file   Highest education level: Not on file  Occupational History   Not on file  Tobacco Use   Smoking status: Never   Smokeless tobacco: Never  Vaping Use   Vaping Use: Never used  Substance and Sexual Activity   Alcohol use: No   Drug use: No   Sexual activity: Yes    Birth control/protection: None  Other Topics Concern   Not on file  Social History Narrative   Not on file   Social Determinants of Health   Financial Resource Strain: Not on file  Food Insecurity: No Food Insecurity (10/21/2019)  Hunger Vital Sign    Worried About Running Out of Food in the Last Year: Never true    Ran Out of Food in the Last Year: Never true  Transportation Needs: No Transportation Needs (10/21/2019)   PRAPARE - Administrator, Civil Service (Medical): No    Lack of Transportation (Non-Medical): No  Physical Activity: Not on file  Stress: Not on file  Social Connections: Not on file    Allergies: No Known Allergies  Metabolic Disorder Labs: Lab Results   Component Value Date   HGBA1C 6.4 (H) 11/02/2020   MPG 137 11/02/2020   No results found for: "PROLACTIN" Lab Results  Component Value Date   CHOL 222 (H) 11/02/2020   TRIG 168 (H) 11/02/2020   HDL 47 11/02/2020   CHOLHDL 4.7 11/02/2020   VLDL 34 11/02/2020   LDLCALC 141 (H) 11/02/2020   Lab Results  Component Value Date   TSH 0.642 11/02/2020   TSH 0.677 09/01/2019    Therapeutic Level Labs: No results found for: "LITHIUM" No results found for: "VALPROATE" No results found for: "CBMZ"  Current Medications: Current Outpatient Medications  Medication Sig Dispense Refill   atomoxetine (STRATTERA) 40 MG capsule Take 1 capsule (40 mg total) by mouth daily. 30 capsule 2   escitalopram (LEXAPRO) 10 MG tablet Take 1 tablet (10 mg total) by mouth daily. 30 tablet 2   hydrOXYzine (ATARAX/VISTARIL) 25 MG tablet Take 1 tablet (25 mg total) by mouth 3 (three) times daily as needed for anxiety. 90 tablet 0   Multiple Vitamin (MULTIVITAMIN WITH MINERALS) TABS tablet Take 1 tablet by mouth daily.     traZODone (DESYREL) 50 MG tablet Take 1 tablet (50 mg total) by mouth at bedtime as needed for sleep. 30 tablet 1   No current facility-administered medications for this visit.     Musculoskeletal: Strength & Muscle Tone: within normal limits Gait & Station: normal Patient leans: N/A  Psychiatric Specialty Exam: Review of Systems  Psychiatric/Behavioral:  Negative for decreased concentration, dysphoric mood, hallucinations, self-injury, sleep disturbance and suicidal ideas. The patient is nervous/anxious. The patient is not hyperactive.     currently breastfeeding.There is no height or weight on file to calculate BMI.  General Appearance: Casual  Eye Contact:  Good  Speech:  Clear and Coherent and Normal Rate  Volume:  Normal  Mood:  Euthymic  Affect:  Appropriate  Thought Process:  Coherent and Descriptions of Associations: Intact  Orientation:  Full (Time, Place, and Person)   Thought Content: WDL   Suicidal Thoughts:  No  Homicidal Thoughts:  No  Memory:  Immediate;   Good Recent;   Good Remote;   Good  Judgement:  Good  Insight:  Good  Psychomotor Activity:  Normal  Concentration:  Concentration: Good and Attention Span: Good  Recall:  Good  Fund of Knowledge: Good  Language: Good  Akathisia:  No  Handed:  Right  AIMS (if indicated): not done  Assets:  Communication Skills Desire for Improvement Housing Social Support Transportation  ADL's:  Intact  Cognition: WNL  Sleep:  Good   Screenings: GAD-7    Flowsheet Row Video Visit from 07/26/2021 in [facility] Office Visit from 06/13/2021 in [facility] Counselor from 05/08/2020 in [facility] Clinical Support from 10/27/2019 in [facility] Clinical Support from 10/22/2019 in Center for [employer] [facility]  Total GAD-7 Score 8 15 18 9 6       PHQ2-9    Flowsheet Row Video Visit from 07/26/2021 in [facility] Office Visit from 06/13/2021 in [facility] Counselor from 05/08/2020 in [facility] Clinical Support from 10/27/2019 in [facility] for Women Clinical Support from 10/22/2019 in [facility] for Women  PHQ-2 Total Score 0 3 5 1  0  PHQ-9 Total Score -- 15 19 5 4       Flowsheet Row Video Visit from 07/26/2021 in [facility] ED from 11/02/2020 in [facility] Counselor from 05/08/2020 in [facility]  C-SSRS RISK CATEGORY Moderate Risk High Risk Low Risk        Assessment and Plan:    Collaboration of Care: Collaboration of Care: Medication Management AEB provider managing  patient's psychiatric medications, Psychiatrist AEB patient being followed by a mental health provider, and Referral or follow-up with counselor/therapist AEB patient being seen by a licensed clinical social worker  Patient/Guardian was advised Release of Information must be obtained prior to any record release in order to collaborate their care with an outside provider. Patient/Guardian was advised if they have not already done so to contact the registration department to sign all necessary forms in order for [facility] to release information regarding their care.   Consent: Patient/Guardian gives verbal consent for treatment and assignment of benefits for services provided during this visit. Patient/Guardian expressed understanding and agreed to proceed.   1. Major depressive disorder, recurrent episode, moderate with anxious distress (HCC)  - escitalopram (LEXAPRO) 10 MG tablet; Take 1 tablet (10 mg total) by mouth daily.  Dispense: 30 tablet; Refill: 2 - traZODone (DESYREL) 50 MG tablet; Take 1 tablet (50 mg total) by mouth at bedtime as needed for sleep.  Dispense: 30 tablet; Refill: 1  2. Sleep disturbances  - traZODone (DESYREL) 50 MG tablet; Take 1 tablet (50 mg total) by mouth at bedtime as needed for sleep.  Dispense: 30 tablet; Refill: 1  3. Attention and concentration deficit  - atomoxetine (STRATTERA) 40 MG capsule; Take 1 capsule (40 mg total) by mouth daily.  Dispense: 30 capsule; Refill: 2  Patient to follow up in 2 months Provider spent a total of 14 minutes with the patient/reviewing the patient's chart  05/10/2020, PA 07/29/2021, 8:36 PM

## 2021-09-03 ENCOUNTER — Ambulatory Visit (INDEPENDENT_AMBULATORY_CARE_PROVIDER_SITE_OTHER): Payer: No Payment, Other | Admitting: Clinical

## 2021-09-03 DIAGNOSIS — F331 Major depressive disorder, recurrent, moderate: Secondary | ICD-10-CM | POA: Diagnosis not present

## 2021-09-03 NOTE — Progress Notes (Signed)
THERAPIST PROGRESS NOTE Virtual Visit via Video Note  I connected with Krystal Glass on 09/03/21 at  3:00 PM EDT by a video enabled telemedicine application and verified that I am speaking with the correct person using two identifiers.  Location: Patient: home Provider: office   I discussed the limitations of evaluation and management by telemedicine and the availability of in person appointments. The patient expressed understanding and agreed to proceed.   Follow Up Instructions: I discussed the assessment and treatment plan with the patient. The patient was provided an opportunity to ask questions and all were answered. The patient agreed with the plan and demonstrated an understanding of the instructions.   The patient was advised to call back or seek an in-person evaluation if the symptoms worsen or if the condition fails to improve as anticipated.   Session Time: 30 minutes  Participation Level: Active  Behavioral Response: CasualAlertEuthymic  Type of Therapy: Individual Therapy  Treatment Goals addressed: client will score less than 5 on the GAD-7  ProgressTowards Goals: Progressing  Interventions: CBT  Summary:  Krystal Glass is a 31 y.o. female who presents for the scheduled appointment oriented times five, appropriately dressed, and friendly. Client denied hallucinations and delusions. Client reported on today she is doing better. Client reported since she was last seen she has not had issues with psychiatric medications. Client reported after the compliant of her initial reaction she noticed two days later her symptoms subsided. Client reported she can tell it has helped with her anxiety and focus. Client reported her family has made comment to her about appearing more at ease. Client reported she has continued to make changes to her lifestyle such as eating and physical exercise. Client reported feeling the best she's felt in awhile.  Evidence of progress towards  goal:  client reported she is going to the gym at least 5 out of 7 days per week. Client reported she has lost 20 pounds as a positive result.    09/03/2021    7:06 PM 07/26/2021    3:46 PM 06/13/2021    2:06 PM 05/08/2020    3:14 PM  GAD 7 : Generalized Anxiety Score  Nervous, Anxious, on Edge 1 2 2 3   Control/stop worrying 0 1 3 2   Worry too much - different things 0 2 3 2   Trouble relaxing 1 1 2 3   Restless 0 0 1 2  Easily annoyed or irritable 0 2 3 3   Afraid - awful might happen 0 0 1 3  Total GAD 7 Score 2 8 15 18   Anxiety Difficulty Somewhat difficult Not difficult at all Somewhat difficult Very difficult      Suicidal/Homicidal: Nowithout intent/plan  Therapist Response:  Therapist began the appointment asking the client how she has been doing since last seen. Therapist used CBT to engage using positive support and active listening. Therapist used CBT to engage and ask the client about her medication compliance and side effects. Therapist used CBT to engage and ask the client to describe her anxiety symptoms and used skills to help her manage. Therapist used CBT ask the client to identify her progress with frequency of use with coping skills with continued practice in her daily activity.    Therapist assigned the client homework to continue physical exercise and using a planner to organize her week.    Plan: Return again in 5 weeks.  Diagnosis: major depressive disorder, recurrent episode, moderate with anxious distress  Collaboration of Care: Other none requested  by the client.  Patient/Guardian was advised Release of Information must be obtained prior to any record release in order to collaborate their care with an outside provider. Patient/Guardian was advised if they have not already done so to contact the registration department to sign all necessary forms in order for [place] to release information regarding their care.   Consent: Patient/Guardian gives verbal consent for  treatment and assignment of benefits for services provided during this visit. Patient/Guardian expressed understanding and agreed to proceed.   Krystal Rhymes Edel Rivero, LCSW 09/03/2021

## 2021-09-03 NOTE — Plan of Care (Signed)
  Problem: Anxiety Disorder CCP Problem  1  Goal: LTG: Patient will score less than 5 on the Generalized Anxiety Disorder 7 Scale (GAD-7) Outcome: Progressing Goal: STG: Patient will participate in at least 80% of scheduled individual psychotherapy sessions Outcome: Progressing Goal: STG: Patient will complete at least 80% of assigned homework Outcome: Progressing Goal: STG: Patient will reduce frequency of avoidant behaviors by 50% as evidenced by self-report in therapy sessions Outcome: Progressing   

## 2021-09-17 ENCOUNTER — Ambulatory Visit (INDEPENDENT_AMBULATORY_CARE_PROVIDER_SITE_OTHER): Payer: No Payment, Other | Admitting: Clinical

## 2021-09-17 DIAGNOSIS — F331 Major depressive disorder, recurrent, moderate: Secondary | ICD-10-CM | POA: Diagnosis not present

## 2021-09-23 NOTE — Progress Notes (Signed)
THERAPIST PROGRESS NOTE Virtual Visit via Video Note  I connected with Krystal Glass on 09/23/21 at  2:00 PM EDT by a video enabled telemedicine application and verified that I am speaking with the correct person using two identifiers.  Location: Patient: home Provider: office   I discussed the limitations of evaluation and management by telemedicine and the availability of in person appointments. The patient expressed understanding and agreed to proceed.   Follow Up Instructions: I discussed the assessment and treatment plan with the patient. The patient was provided an opportunity to ask questions and all were answered. The patient agreed with the plan and demonstrated an understanding of the instructions.   The patient was advised to call back or seek an in-person evaluation if the symptoms worsen or if the condition fails to improve as anticipated.   Session Time: 45 minutes  Participation Level: Active  Behavioral Response: CasualAlertEuthymic  Type of Therapy: Individual Therapy  Treatment Goals addressed: Client will reduce frequency reported behaviors by 50% as evidenced by self-reported therapy sessions  ProgressTowards Goals: Progressing  Interventions: CBT and Supportive  Summary:  Krystal Glass is a 31 y.o. female who presents for scheduled session oriented x5, appropriately dressed, and friendly.  Client denied hallucinations and delusions.  Client reported on today she is doing pretty well.  Client reported she is in the routine now of having her 65-year-old son staying with the ABA therapist more hours during the day.  Client reported so far he is doing well with it but she is having some depressive emotions thinking about the transition.  Client reported she feels that that she was not the same version of herself that she currently is for her 44-year-old son when he was a baby.  Client reported there is some things that she cannot recall because she blocked it out  during that time due to postpartum depression.  Client reported she is sad that she cannot spend quality time with her 47-year-old like she does her youngest child.  Client reported overall she knows that she is doing what is best for her kids.  Client reported she had 1 encounter while in the store with her husband of seeing a friend that she previously mentioned who abruptly decided to stop communicating with her.  Client reported she panicked to avoid running into him.  Client reported it brought back memories of how she thought during childhood about trying to figure out what she did wrong.  Client reported she has a hard time with letting things go she cannot get the answer of why things cannot be resolved. Evidence of progress towards goal: Client reported she has been in the gym consistently for the past 3 months which she used to shy away from and is seeing positive result which has helped to improve her depression and anxiety symptoms.    Suicidal/Homicidal: Nowithout intent/plan  Therapist Response:  Therapist began the appointment asking the client how she has been doing since last seen. Therapist used CBT to engage using active listening and positive emotional support. Therapist used CBT to ask client about open-ended questions about challenging situations that occurred since last seen. Therapist used CBT to ask the client how she handled those situations and collaborate to discuss how to improve responding to similar situations moving forward. Therapist used CBT to positively acknowledge the clients efforts to create balance and also prioritize herself. Therapist used CBT ask the client to identify her progress with frequency of use with coping skills with  continued practice in her daily activity.       Plan: Return again in 4 weeks.  Diagnosis: Major depressive disorder, recurrent episode, moderate with anxious distress  Collaboration of Care: Patient refused AEB none requested by  the client.  Patient/Guardian was advised Release of Information must be obtained prior to any record release in order to collaborate their care with an outside provider. Patient/Guardian was advised if they have not already done so to contact the registration department to sign all necessary forms in order for [place] to release information regarding their care.   Consent: Patient/Guardian gives verbal consent for treatment and assignment of benefits for services provided during this visit. Patient/Guardian expressed understanding and agreed to proceed.   Neena Rhymes Renaldo Gornick, LCSW 09/17/2021

## 2021-09-23 NOTE — Plan of Care (Signed)
  Problem: Anxiety Disorder CCP Problem  1  Goal: LTG: Patient will score less than 5 on the Generalized Anxiety Disorder 7 Scale (GAD-7) Outcome: Progressing Goal: STG: Patient will participate in at least 80% of scheduled individual psychotherapy sessions Outcome: Progressing Goal: STG: Patient will complete at least 80% of assigned homework Outcome: Progressing Goal: STG: Patient will reduce frequency of avoidant behaviors by 50% as evidenced by self-report in therapy sessions Outcome: Progressing   

## 2021-09-27 ENCOUNTER — Encounter (HOSPITAL_COMMUNITY): Payer: Self-pay | Admitting: Physician Assistant

## 2021-09-27 ENCOUNTER — Telehealth (INDEPENDENT_AMBULATORY_CARE_PROVIDER_SITE_OTHER): Payer: No Payment, Other | Admitting: Physician Assistant

## 2021-09-27 DIAGNOSIS — R41840 Attention and concentration deficit: Secondary | ICD-10-CM | POA: Diagnosis not present

## 2021-09-27 DIAGNOSIS — F331 Major depressive disorder, recurrent, moderate: Secondary | ICD-10-CM

## 2021-09-27 DIAGNOSIS — G479 Sleep disorder, unspecified: Secondary | ICD-10-CM | POA: Diagnosis not present

## 2021-09-27 MED ORDER — ESCITALOPRAM OXALATE 10 MG PO TABS: 10.0000 mg | ORAL_TABLET | Freq: Every day | ORAL | 3 refills | Status: DC

## 2021-09-27 MED ORDER — ATOMOXETINE HCL 40 MG PO CAPS: 40.0000 mg | ORAL_CAPSULE | Freq: Every day | ORAL | 3 refills | Status: DC

## 2021-09-27 MED ORDER — TRAZODONE HCL 50 MG PO TABS: 50.0000 mg | ORAL_TABLET | Freq: Every evening | ORAL | 2 refills | Status: DC | PRN

## 2021-09-27 NOTE — Progress Notes (Signed)
BH MD/PA/NP OP Progress Note  Virtual Visit via Video Note  I connected with [name] on 09/27/21 at  4:00 PM EDT by a video enabled telemedicine application and verified that I am speaking with the correct person using two identifiers.  Location: Patient: Home Provider: Clinic   I discussed the limitations of evaluation and management by telemedicine and the availability of in person appointments. The patient expressed understanding and agreed to proceed.  Follow Up Instructions:  I discussed the assessment and treatment plan with the patient. The patient was provided an opportunity to ask questions and all were answered. The patient agreed with the plan and demonstrated an understanding of the instructions.   The patient was advised to call back or seek an in-person evaluation if the symptoms worsen or if the condition fails to improve as anticipated.  I provided 12 minutes of non-face-to-face time during this encounter.  Meta Hatchet, PA   09/27/2021 11:45 PM Sahmya Arai  MRN:  [id number]  Chief Complaint:  Chief Complaint  Patient presents with   Medication Refill   Follow-up   HPI:   Krystal Glass is a 31 year old female with a past psychiatric history significant for major depressive disorder with anxious distress, attention/concentration deficit, and sleep disturbances who presents to [facility] via virtual video visit for follow-up and medication management.  Patient is currently being managed on the following medications:  Escitalopram 10 mg daily Strattera 40 mg daily Trazodone 50 mg at bedtime  Patient denies any issues or concerns regarding her current medication regimen.  Patient was previously experiencing cramping/sweating when taking escitalopram but denies experiencing the side effects from her medications and is doing well overall.  Patient reports that she has not needed to use her trazodone for a while and  states that her sleep has been relatively good.  Patient denies depressive symptoms but notes that 2 weeks ago she did not take her medication due to forgetting to do so and noticed that her mood was off and she felt more irritable.  Patient still endorses anxiety but states that it has not been as bad lately.  Whenever her anxiety is elevated, patient states that she engages in nail biting.  She states that her anxiety was so bad that once that she bit her nails down to the nose causing her fingers to bleed.  Patient also states that her anxiety is attributed to an argument she recently had with her father.  Patient endorses some mild nervousness at this time.  Patient's current stressors involve her 70-year-old starting preschool and her busy schedule with preparing for kids for school.  A GAD-7 screen was performed with the patient scoring a 5.  Patient is alert and oriented x4, calm, cooperative, and fully engaged in conversation during the encounter.  Patient endorses neutral mood.  Patient denies suicidal or homicidal ideations.  She further denies auditory or visual hallucinations and does not appear to be responding to internal plus external stimuli.  Patient endorses good sleep and receives on average 6 hours of sleep each night.  Patient endorses good appetite and eats on average 2 meals per day.  Patient endorses alcohol consumption on occasion.  Patient denies tobacco use and illicit drug.  Visit Diagnosis:    ICD-10-CM   1. Major depressive disorder, recurrent episode, moderate with anxious distress (HCC)  F33.1 traZODone (DESYREL) 50 MG tablet    escitalopram (LEXAPRO) 10 MG tablet    2. Sleep disturbances  G47.9 traZODone (DESYREL) 50 MG tablet    3. 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 10/12/2015   Closed displaced fracture of left clavicle 10/12/2015   Closed displaced segmental fracture of shaft of left humerus 10/09/2015   Gestational diabetes    PCOS (polycystic ovarian syndrome)    Postpartum depression     Past Surgical History:  Procedure Laterality Date   ORIF HUMERUS FRACTURE Left 10/10/2015   Procedure: OPEN REDUCTION INTERNAL FIXATION (ORIF) LEFT HUMERUS AND CLAVICLE FRACTURE;  Surgeon: Altamese Farwell, MD;  Location: [place];  Service: Orthopedics;  Laterality: Left;    Family Psychiatric History:  Patient denies a family history of psychiatric illness.  She reports there are no documents regarding family members having mental health due to parents disbelief around mental health.  Family History:  Family History  Problem Relation Age of Onset   Hypothyroidism Mother     Social History:  Social History   Socioeconomic History   Marital status: Married    Spouse name: Not on file   Number of children: Not on file   Years of education: Not on file   Highest education level: Not on file  Occupational History   Not on file  Tobacco Use   Smoking status: Never   Smokeless tobacco: Never  Vaping Use   Vaping Use: Never used  Substance and Sexual Activity   Alcohol use: No   Drug use: No   Sexual activity: Yes    Birth control/protection: None  Other Topics Concern   Not on file  Social History Narrative   Not on file   Social Determinants of Health   Financial Resource Strain: Not on file  Food Insecurity: No Food Insecurity (10/21/2019)   Hunger Vital Sign    Worried About Running Out of Food in the Last Year: Never true    Ran Out of Food in the Last Year: Never true  Transportation Needs: No Transportation Needs (10/21/2019)   PRAPARE - Hydrologist (Medical): No    Lack of Transportation (Non-Medical): No  Physical  Activity: Not on file  Stress: Not on file  Social Connections: Not on file    Allergies: No Known Allergies  Metabolic Disorder Labs: Lab Results  Component Value Date   HGBA1C 6.4 (H) 11/02/2020   MPG 137 11/02/2020   No results found for: "PROLACTIN" Lab Results  Component Value Date   CHOL 222 (H) 11/02/2020   TRIG 168 (H) 11/02/2020   HDL 47 11/02/2020   CHOLHDL 4.7 11/02/2020   VLDL 34 11/02/2020   LDLCALC 141 (H) 11/02/2020   Lab Results  Component Value Date   TSH 0.642 11/02/2020   TSH 0.677 09/01/2019    Therapeutic Level Labs: No results found for: "LITHIUM" No results found for: "VALPROATE" No results found for: "CBMZ"  Current Medications: Current Outpatient Medications  Medication Sig Dispense Refill   atomoxetine (STRATTERA) 40 MG capsule Take 1 capsule (40 mg total) by mouth daily. 30 capsule 3   escitalopram (LEXAPRO) 10 MG tablet Take 1 tablet (10 mg total) by mouth daily. 30 tablet 3   hydrOXYzine (ATARAX/VISTARIL) 25 MG tablet Take 1 tablet (25 mg total) by mouth  3 (three) times daily as needed for anxiety. 90 tablet 0   Multiple Vitamin (MULTIVITAMIN WITH MINERALS) TABS tablet Take 1 tablet by mouth daily.     traZODone (DESYREL) 50 MG tablet Take 1 tablet (50 mg total) by mouth at bedtime as needed for sleep. 30 tablet 2   No current facility-administered medications for this visit.     Musculoskeletal: Strength & Muscle Tone: within normal limits Gait & Station: normal Patient leans: N/A  Psychiatric Specialty Exam: Review of Systems  Psychiatric/Behavioral:  Negative for decreased concentration, dysphoric mood, hallucinations, self-injury, sleep disturbance and suicidal ideas. The patient is nervous/anxious. The patient is not hyperactive.     currently breastfeeding.There is no height or weight on file to calculate BMI.  General Appearance: Casual  Eye Contact:  Good  Speech:  Clear and Coherent and Normal Rate  Volume:  Normal   Mood:  Euthymic  Affect:  Appropriate  Thought Process:  Coherent and Descriptions of Associations: Intact  Orientation:  Full (Time, Place, and Person)  Thought Content: WDL   Suicidal Thoughts:  No  Homicidal Thoughts:  No  Memory:  Immediate;   Good Recent;   Good Remote;   Good  Judgement:  Good  Insight:  Good  Psychomotor Activity:  Normal  Concentration:  Concentration: Good and Attention Span: Good  Recall:  Good  Fund of Knowledge: Good  Language: Good  Akathisia:  No  Handed:  Right  AIMS (if indicated): not done  Assets:  Communication Skills Desire for Improvement Housing Social Support Transportation  ADL's:  Intact  Cognition: WNL  Sleep:  Good   Screenings: GAD-7    Flowsheet Row Video Visit from 09/27/2021 in [facility] Counselor from 09/03/2021 in [facility] Video Visit from 07/26/2021 in [facility] Office Visit from 06/13/2021 in [facility] Counselor from 05/08/2020 in [facility]  Total GAD-7 Score 5 2 8 15 18       PHQ2-9    Flowsheet Row Video Visit from 09/27/2021 in [facility] Video Visit from 07/26/2021 in [facility] Office Visit from 06/13/2021 in [facility] Counselor from 05/08/2020 in [place] from 10/27/2019 in [facility] [place] [facility]  PHQ-2 Total Score 1 0 3 5 1   PHQ-9 Total Score -- -- 15 19 5       Flowsheet Row Video Visit from 09/27/2021 in [facility] Video Visit from 07/26/2021 in [facility] ED from 11/02/2020 in [place] Moderate Risk Moderate Risk High Risk        Assessment and Plan:  Taurie Greenslade is a  31 year old female with a past psychiatric history significant for major depressive disorder with anxious distress, attention/concentration deficit, and sleep disturbances who presents to [facility] via virtual video visit for follow-up and medication management.  Patient reports no major issues or concerns regarding her medication use.  She denies experiencing depressive episodes but states that she still continues to endorse some anxiety that is often accompanied by nail biting.  Patient denies the need for dosage adjustments at this time and appears stable.  Patient's medications to be e-prescribed to pharmacy of choice.  Collaboration of Care: Collaboration of Care: Medication Management AEB provider managing patient's psychiatric  medications, Psychiatrist AEB patient being followed by a mental health provider, and Referral or follow-up with counselor/therapist AEB patient being seen by a licensed clinical social worker  Patient/Guardian was advised Release of Information must be obtained prior to any record release in order to collaborate their care with an outside provider. Patient/Guardian was advised if they have not already done so to contact the registration department to sign all necessary forms in order for [place] to release information regarding their care.   Consent: Patient/Guardian gives verbal consent for treatment and assignment of benefits for services provided during this visit. Patient/Guardian expressed understanding and agreed to proceed.   1. Major depressive disorder, recurrent episode, moderate with anxious distress (HCC)  - traZODone (DESYREL) 50 MG tablet; Take 1 tablet (50 mg total) by mouth at bedtime as needed for sleep.  Dispense: 30 tablet; Refill: 2 - escitalopram (LEXAPRO) 10 MG tablet; Take 1 tablet (10 mg total) by mouth daily.  Dispense: 30 tablet; Refill: 3  2. Sleep disturbances  - traZODone (DESYREL) 50 MG tablet; Take 1  tablet (50 mg total) by mouth at bedtime as needed for sleep.  Dispense: 30 tablet; Refill: 2  3. Attention and concentration deficit  - atomoxetine (STRATTERA) 40 MG capsule; Take 1 capsule (40 mg total) by mouth daily.  Dispense: 30 capsule; Refill: 3  Patient to follow up in 2 months Provider spent a total of 12 minutes with the patient/reviewing the patient's chart  Meta Hatchet, PA 09/27/2021, 11:45 PM

## 2021-10-04 ENCOUNTER — Ambulatory Visit (INDEPENDENT_AMBULATORY_CARE_PROVIDER_SITE_OTHER): Payer: No Payment, Other | Admitting: Clinical

## 2021-10-04 DIAGNOSIS — F331 Major depressive disorder, recurrent, moderate: Secondary | ICD-10-CM

## 2021-10-04 NOTE — Progress Notes (Signed)
THERAPIST PROGRESS NOTE Virtual Visit via Video Note  I connected with Krystal Glass on 10/04/21 at  1:00 PM EDT by a video enabled telemedicine application and verified that I am speaking with the correct person using two identifiers.  Location: Patient: home Provider: office   I discussed the limitations of evaluation and management by telemedicine and the availability of in person appointments. The patient expressed understanding and agreed to proceed.   Follow Up Instructions: I discussed the assessment and treatment plan with the patient. The patient was provided an opportunity to ask questions and all were answered. The patient agreed with the plan and demonstrated an understanding of the instructions.   The patient was advised to call back or seek an in-person evaluation if the symptoms worsen or if the condition fails to improve as anticipated.    Session Time: 35 minutes  Participation Level: Active  Behavioral Response: CasualAlertEuthymic  Type of Therapy: Individual Therapy  Treatment Goals addressed: Client will reduce frequency of avoidant behaviors by 50% as evidenced by self-reported therapy sessions ProgressTowards Goals: Progressing  Interventions: CBT and Supportive  Summary:  Krystal Glass is a 31 y.o. female who presents for the scheduled appointment oriented x5, appropriately dressed, and friendly.  Client denied hallucinations and delusions. Client reported on today she is doing well.  Client reported however that she has not gone to the pharmacy to pick up her Lexapro prescription.  Client reported she can tell that she has been a little more irritable but it has been manageable.  Client reported otherwise she has still been productive.  Client reported she has been struggling with positive self talk.  Client reported for example she makes appointment to go to the gym at least 4 days out of the week.  Client reported over the past week she has only been  able to go 2 days due to her son's school schedule.  Client reported when she has a cheat day also enjoying a soda she struggles with feeling like she deserves it because she did not go to the gym as many days as she normally would.  Client reported her issues with developing a positive relationship with food and her body image stem from childhood.  Client reported she grew up with food insecurity and negative comments from her mother about how they developed earlier on. Client reported she has made an effort to not weigh herself and find the positives and other ways that she has been changing.  Client reported she has lost 25 pounds since May and she has also been able to get into a pair of jeans that she really liked. Evidence of progress towards goal: Client reported continued progress with going to the gym at least 4 out of 7 days a week which helps to improve her mood.  Client reported she is also confronting challenging negative thoughts originating from childhood at least 4 out of 7 days a week.   Suicidal/Homicidal: Nowithout intent/plan  Therapist Response:  Therapist began the appointment asking the client how she has been doing since last seen. Therapist used CBT to engage using active listening and positive emotional support. Therapist used CBT is asked the client about her continued response to medication compliance. Therapist used CBT to engage and asked the client to describe her difficulty with balancing expectations of herself and the origin of her negative thinking. Therapist used CBT to discuss challenging negative thoughts and being flexible with maintaining small term goals to improve depressed or irritable  mood. Therapist used CBT ask the client to identify her progress with frequency of use with coping skills with continued practice in her daily activity.    Therapist assigned client homework to continue identifying small goals for her that pertain to challenging negative  thoughts.    Plan: Return again in 5 weeks.  Diagnosis: Major depressive disorder, recurrent episode, moderate with anxious distress  Collaboration of Care: Patient refused AEB none identified by the client at this time.  Patient/Guardian was advised Release of Information must be obtained prior to any record release in order to collaborate their care with an outside provider. Patient/Guardian was advised if they have not already done so to contact the registration department to sign all necessary forms in order for [place] to release information regarding their care.   Consent: Patient/Guardian gives verbal consent for treatment and assignment of benefits for services provided during this visit. Patient/Guardian expressed understanding and agreed to proceed.   Neena Rhymes Jeannia Tatro, LCSW 10/04/2021

## 2021-10-04 NOTE — Plan of Care (Signed)
  Problem: Anxiety Disorder CCP Problem  1  Goal: LTG: Patient will score less than 5 on the Generalized Anxiety Disorder 7 Scale (GAD-7) Outcome: Progressing Goal: STG: Patient will participate in at least 80% of scheduled individual psychotherapy sessions Outcome: Progressing Goal: STG: Patient will complete at least 80% of assigned homework Outcome: Progressing Goal: STG: Patient will reduce frequency of avoidant behaviors by 50% as evidenced by self-report in therapy sessions Outcome: Progressing   

## 2021-11-06 ENCOUNTER — Ambulatory Visit (INDEPENDENT_AMBULATORY_CARE_PROVIDER_SITE_OTHER): Payer: No Payment, Other | Admitting: Clinical

## 2021-11-06 DIAGNOSIS — F331 Major depressive disorder, recurrent, moderate: Secondary | ICD-10-CM

## 2021-11-09 NOTE — Progress Notes (Signed)
THERAPIST PROGRESS NOTE Virtual Visit via Video Note  I connected with Krystal Glass on 11/06/2021 at  3:00 PM EDT by a video enabled telemedicine application and verified that I am speaking with the correct person using two identifiers.  Location: Patient: home Provider: office   I discussed the limitations of evaluation and management by telemedicine and the availability of in person appointments. The patient expressed understanding and agreed to proceed.  Follow Up Instructions: I discussed the assessment and treatment plan with the patient. The patient was provided an opportunity to ask questions and all were answered. The patient agreed with the plan and demonstrated an understanding of the instructions.   The patient was advised to call back or seek an in-person evaluation if the symptoms worsen or if the condition fails to improve as anticipated.   Session Time: 45 minutes  Participation Level: Active  Behavioral Response: CasualAlertAnxious  Type of Therapy: Individual Therapy  Treatment Goals addressed: Client will complete at least 80% of assigned homework  ProgressTowards Goals: Progressing  Interventions: CBT  Summary:  Krystal Glass is a 31 y.o. female who presents for the scheduled appointment oriented x5, appropriately dressed, and friendly.  Client denied hallucinations and delusions. Client reported on today she has been experiencing some stress that has thrown her off of her usual schedule.  Client reported her oldest son has been having some aggression issues in school.  Client reported she been getting regular calls from the teacher about him biting and scratching other children. Client reported feeling discouraged because she has been trying to do everything to help improve his behaviors but feels like she is failing.  Client reported she has been back to finding him some in school services.  Client reported when she feels overwhelmed or frustrated about what  to do with her son has been takes it personally because it is also things that he struggled with as a child.  Client reported her best friend also has a child with autism and feels like she cannot speak to anyone about her struggles and feeling overwhelmed.  Client reported that stress has taken her out of her routine in slowly picking back up on that eating behaviors.  Client reported this year since starting her psych medications it has made the summertime more manageable for her and she has enjoyed going outdoors and exercising which has helped to increase her stamina.  Client reported she has been anxious about the following time that she usually gets seasonal depression and wonders how she will be able to cope with it this year. Evidence of progress towards goal: Client reported 1 negative cognitive pattern that has contributed to depression.  Client reported she is medication compliant 7 out of 7 days a week.   Suicidal/Homicidal: Nowithout intent/plan  Therapist Response:  Therapist began the appointment asking the client how she has been doing since last seen. Therapist used CBT to engage using active listening and positive emotional support. Therapist used CBT to ask client open-ended questions and allow her time to discuss negative emotions related to her current stressors going on with family. Therapist used CBT to engage and normalize her emotional response and discuss cognitive reframing. Therapist used CBT ask the client to identify her progress with frequency of use with coping skills with continued practice in her daily activity.    Therapist assigned the client homework to practice self care.   Plan: Return again in 4 weeks.  Diagnosis: Major depressive disorder, recurrent episode, moderate with  anxious distress  Collaboration of Care: Patient refused AEB no other needs requested by the client at this time.  Patient/Guardian was advised Release of Information must be obtained  prior to any record release in order to collaborate their care with an outside provider. Patient/Guardian was advised if they have not already done so to contact the registration department to sign all necessary forms in order for [place] to release information regarding their care.   Consent: Patient/Guardian gives verbal consent for treatment and assignment of benefits for services provided during this visit. Patient/Guardian expressed understanding and agreed to proceed.   [place]utz, LCSW 11/06/2021

## 2021-11-09 NOTE — Plan of Care (Signed)
  Problem: Anxiety Disorder CCP Problem  1  Goal: LTG: Patient will score less than 5 on the Generalized Anxiety Disorder 7 Scale (GAD-7) Outcome: Progressing Goal: STG: Patient will participate in at least 80% of scheduled individual psychotherapy sessions Outcome: Progressing Goal: STG: Patient will complete at least 80% of assigned homework Outcome: Progressing Goal: STG: Patient will reduce frequency of avoidant behaviors by 50% as evidenced by self-report in therapy sessions Outcome: Progressing

## 2021-11-26 ENCOUNTER — Ambulatory Visit (INDEPENDENT_AMBULATORY_CARE_PROVIDER_SITE_OTHER): Payer: No Payment, Other | Admitting: Clinical

## 2021-11-26 DIAGNOSIS — F331 Major depressive disorder, recurrent, moderate: Secondary | ICD-10-CM

## 2021-11-26 NOTE — Progress Notes (Signed)
THERAPIST PROGRESS NOTE Virtual Visit via Video Note  I connected with Newell Coral on 11/26/2021 at  3:00 PM EDT by a video enabled telemedicine application and verified that I am speaking with the correct person using two identifiers.  Location: Patient: home Provider: office   I discussed the limitations of evaluation and management by telemedicine and the availability of in person appointments. The patient expressed understanding and agreed to proceed.   Follow Up Instructions: I discussed the assessment and treatment plan with the patient. The patient was provided an opportunity to ask questions and all were answered. The patient agreed with the plan and demonstrated an understanding of the instructions.   The patient was advised to call back or seek an in-person evaluation if the symptoms worsen or if the condition fails to improve as anticipated.   Session Time: 55 minutes  Participation Level: Active  Behavioral Response: CasualAlertAnxious  Type of Therapy: Individual Therapy  Treatment Goals addressed: client will reduce frequency of avoidant behaviors by 50% as evidenced by self report in therapy sessions  ProgressTowards Goals: Progressing  Interventions: CBT  Summary:  Adriauna Campton is a 31 y.o. female who presents for the scheduled appointment oriented times five, appropriately dressed,and friendly. Client denied hallucinations and delusions. Client reported she has been feeling stressed lately from an accumulation of events. Client reported for one she is back talking to her family. Client reported its weird that they go back to calling her regularly and acting normal like there aren't issues that need to be addressed. Client reported otherwise she was on facebook and a old picture popped up with her female best friend that abruptly stopped talking to her. Client reported it triggered thoughts about what happened between them and how he stopped being friends with  her without an explanation once he got in a relationship. Client discussed worrying what if she runs into them in public since they don't live far from her. Client reported related to childhood feeling like she always had to be the person who apologizes to make things better even if she isn't at fault. Client reported it's something she has to work on. Evidence of progress towards goal:  client reported 1 source of her anxious and depressive feelings as it relates to childhood.   Suicidal/Homicidal: Nowithout intent/plan  Therapist Response:  Therapist began the appointment asking the client how she has been doing since last seen. Therapist used CBT to engage using active listening and positive emotional support. Therapist used CBT to engage and give the client time to discuss her triggering thoughts and emotions. Therapist used CBT to normalize the clients emotional response and collaborate to process her thoughts and brainstorm balanced thoughts. Therapist used CBT ask the client to identify her progress with frequency of use with coping skills with continued practice in her daily activity.    Therapist assigned the client homework to practice self care.     Plan: Return again in 3 weeks.  Diagnosis: MDD, recurrent episode, moderate with anxious distress  Collaboration of Care: Patient refused AEB none requested by the client.  Patient/Guardian was advised Release of Information must be obtained prior to any record release in order to collaborate their care with an outside provider. Patient/Guardian was advised if they have not already done so to contact the registration department to sign all necessary forms in order for [place] to release information regarding their care.   Consent: Patient/Guardian gives verbal consent for treatment and assignment of benefits for services  provided during this visit. Patient/Guardian expressed understanding and agreed to proceed.   Neena Rhymes Amadi Frady,  LCSW 11/26/2021

## 2021-12-01 NOTE — Plan of Care (Signed)
  Problem: Anxiety Disorder CCP Problem  1  Goal: LTG: Patient will score less than 5 on the Generalized Anxiety Disorder 7 Scale (GAD-7) Outcome: Progressing Goal: STG: Patient will participate in at least 80% of scheduled individual psychotherapy sessions Outcome: Progressing Goal: STG: Patient will complete at least 80% of assigned homework Outcome: Progressing Goal: STG: Patient will reduce frequency of avoidant behaviors by 50% as evidenced by self-report in therapy sessions Outcome: Progressing   

## 2021-12-13 ENCOUNTER — Ambulatory Visit (INDEPENDENT_AMBULATORY_CARE_PROVIDER_SITE_OTHER): Payer: No Payment, Other | Admitting: Clinical

## 2021-12-13 DIAGNOSIS — F331 Major depressive disorder, recurrent, moderate: Secondary | ICD-10-CM

## 2021-12-13 NOTE — Plan of Care (Signed)
  Problem: Anxiety Disorder CCP Problem  1  Goal: LTG: Patient will score less than 5 on the Generalized Anxiety Disorder 7 Scale (GAD-7) Outcome: Progressing Goal: STG: Patient will participate in at least 80% of scheduled individual psychotherapy sessions Outcome: Progressing Goal: STG: Patient will complete at least 80% of assigned homework Outcome: Progressing Goal: STG: Patient will reduce frequency of avoidant behaviors by 50% as evidenced by self-report in therapy sessions Outcome: Progressing   

## 2021-12-13 NOTE — Progress Notes (Signed)
THERAPIST PROGRESS NOTE Virtual Visit via Video Note  I connected with Krystal Glass on 12/13/21 at  3:00 PM EDT by a video enabled telemedicine application and verified that I am speaking with the correct person using two identifiers.  Location: Patient: home Provider: office   I discussed the limitations of evaluation and management by telemedicine and the availability of in person appointments. The patient expressed understanding and agreed to proceed.   Follow Up Instructions: I discussed the assessment and treatment plan with the patient. The patient was provided an opportunity to ask questions and all were answered. The patient agreed with the plan and demonstrated an understanding of the instructions.   The patient was advised to call back or seek an in-person evaluation if the symptoms worsen or if the condition fails to improve as anticipated.   Session Time: 45 minutes  Participation Level: Active  Behavioral Response: CasualAlertDepressed  Type of Therapy: Individual Therapy  Treatment Goals addressed: Client will reduce frequency of avoidant behaviors by 50% as evidenced by self-reported therapy sessions  ProgressTowards Goals: Progressing  Interventions: CBT and Supportive  Summary:  Krystal Glass is a 31 y.o. female who presents for the scheduled appointment oriented times five, appropriately dressed, and friendly. Client denied hallucinations and delusions. Client reported she has been having symptoms of anger, sadness and grief. Client reported she recently celebrated a holiday related to her culture of remembering deceased loved ones. Client reported it has brought up thoughts about her upbringing of when her parents brought them to the [place] Client reported particularly thinking about her grandmother.  Client reported she is very close with her grandparents on her mother side.  Client reported her grandmother was like a second monitor.  Client reported her  parents moved her to the [place] when she was 31 years old.  Client reported overnight she was uprooted from her grandparents and had not seen them since.  Client reported her grandmother passed at 69 years old and she remembers talking with her about seeing her again but she was not able to.  Client reported her grandfather since her messages daily and they talk regularly.  Client reported having a mix of emotions between anger but also empathy towards her parents for how things transpired.  Client reported she wants to let go of negative emotions so she can do at this time of year in a healthy way.  Client reported ultimately to date her childhood was not the best but she knows that her parents will do anything for her and her siblings. Evidence of progress towards goal:  Client reported she is compliant with her psychiatric medications 7 days/week.  Client reported 1 positive skill of acknowledging her negative emotions that she ignores as it relates to childhood experiences so she can learn how to cope with them moving forward.  Suicidal/Homicidal: Nowithout intent/plan  Therapist Response:  Therapist began the appointment asking the client how she has been doing since last seen. Therapist used CBT to engage using active listening and positive emotional support towards her thoughts and feelings. Therapist used CBT to engage the client and give her time to process her negative emotions that come up towards the end of the year related to traumatic childhood experiences. Therapist used CBT to validate the clients emotional response within reason and ask open-ended questions about her thought process between emotions of her grief, anger and guilt. Therapist used CBT ask the client to identify her progress with frequency of use with coping  skills with continued practice in her daily activity.    Therapist assigned client homework to get a journal to write about her feelings that she usually suppresses  related to her childhood experience and grief. Client was scheduled for next appointment.    Plan: Return again in 2 weeks.  Diagnosis: Major depressive disorder, recurrent episode, moderate with anxious distress  Collaboration of Care: Patient refused AEB none requested by the client at this time.  Patient/Guardian was advised Release of Information must be obtained prior to any record release in order to collaborate their care with an outside provider. Patient/Guardian was advised if they have not already done so to contact the registration department to sign all necessary forms in order for [place] to release information regarding their care.   Consent: Patient/Guardian gives verbal consent for treatment and assignment of benefits for services provided during this visit. Patient/Guardian expressed understanding and agreed to proceed.   Neena Rhymes Morse Brueggemann, LCSW 12/13/2021

## 2021-12-27 ENCOUNTER — Telehealth (HOSPITAL_COMMUNITY): Payer: No Payment, Other | Admitting: Physician Assistant

## 2022-01-01 ENCOUNTER — Telehealth (INDEPENDENT_AMBULATORY_CARE_PROVIDER_SITE_OTHER): Payer: No Payment, Other | Admitting: Psychiatry

## 2022-01-01 DIAGNOSIS — Z758 Other problems related to medical facilities and other health care: Secondary | ICD-10-CM | POA: Diagnosis not present

## 2022-01-01 DIAGNOSIS — F331 Major depressive disorder, recurrent, moderate: Secondary | ICD-10-CM

## 2022-01-01 DIAGNOSIS — R41840 Attention and concentration deficit: Secondary | ICD-10-CM

## 2022-01-01 MED ORDER — ATOMOXETINE HCL 40 MG PO CAPS: 40.0000 mg | ORAL_CAPSULE | Freq: Every day | ORAL | 2 refills | Status: DC

## 2022-01-01 MED ORDER — ESCITALOPRAM OXALATE 10 MG PO TABS: 10.0000 mg | ORAL_TABLET | Freq: Every day | ORAL | 2 refills | Status: DC

## 2022-01-01 NOTE — Patient Instructions (Signed)
Follow up in

## 2022-01-01 NOTE — Progress Notes (Signed)
BH MD/PA/NP OP Progress Note  Virtual Visit via Video Note  I connected with Krystal Glass on 01/01/22 at 11:00 AM EST by a video enabled telemedicine application and verified that I am speaking with the correct person using two identifiers.  Location: Patient: Home Provider: Clinic   I discussed the limitations of evaluation and management by telemedicine and the availability of in person appointments. The patient expressed understanding and agreed to proceed.  Follow Up Instructions:  I discussed the assessment and treatment plan with the patient. The patient was provided an opportunity to ask questions and all were answered. The patient agreed with the plan and demonstrated an understanding of the instructions.   The patient was advised to call back or seek an in-person evaluation if the symptoms worsen or if the condition fails to improve as anticipated.  I provided 20 minutes of non-face-to-face time during this encounter.  Armando Reichert, MD   01/01/2022 11:21 AM Krystal Glass  MRN:  [id number]  Chief Complaint:  Chief Complaint  Patient presents with   Anxiety   Depression   Follow-up   HPI:   Krystal Glass is a 31 year old female with a past psychiatric history significant for major depressive disorder with anxious distress, attention/concentration deficit, and sleep disturbances who presents to [facility] via virtual video visit for follow-up and medication management.  Patient is currently being managed on the following medications:  Escitalopram 10 mg daily Strattera 40 mg daily Trazodone 50 mg at bedtime  PRN  Patient denies any issues or concerns regarding her current medication regimen.   Pt reports that her mood is " allright". She reports improvement in symptoms of depression but endorsing some anxiety.  She reports that she usually feels anxious around this time of the year.  She had lot of deaths in the family  (grandmother and cousin brother )few years ago during this time of the year.  She has been sleeping and eating well.  She reports that she usually takes her medication regularly but sometimes forget taking it for  2-3 days max due to anxiety. She reports that she feels the difference in her mood when she forgets taking her medications.  Encouraged medication compliance.  She reports that when she does not take Strattera she cannot concentrate on things and start multiple tasks at the same time and unable to complete anything.  She gives example of starting a lot of cleaning tasks and not able to finish anything at the end of the day.  She reports that she is also lost in her own world when she does not take Strattera.  Currently, she denies any suicidal ideations, homicidal ideations, auditory and visual hallucinations.  She denies paranoia.  She denies any medication side effects and has been tolerating it well. She reports no change in her current stressors.  She is married, unemployed and lives with her husband and 2 children (4, 2).  She reports that her husband is very supportive.  She denies drinking alcohol and denies using any illicit drugs.  She does not smoke cigarettes.  Patient denies any need for change in medication and medication dosages and wants to continue same meds.  She reports that she does have PCOS but not on any medications.  Labs done last year shows that patient had high cholesterol, LDL and triglycerides.  Recommend her to follow-up with PCP.  She reports that she does not have any PCP.  Will put referral for community health  clinic for PCP.  Patient was given the phone number for community health and wellness clinic at [facility].  She denies any other concerns. Patient is alert and oriented x 4,  calm, cooperative, and fully engaged in conversation during the encounter.  Her thought process is linear with coherent speech . She does not appear to be responding to internal/external stimuli .      Visit Diagnosis:    ICD-10-CM   1. Major depressive disorder, recurrent episode, moderate with anxious distress (HCC)  F33.1 escitalopram (LEXAPRO) 10 MG tablet    2. Attention and concentration deficit  R41.840 atomoxetine (STRATTERA) 40 MG capsule    3. 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  She reports there are no documents regarding family members having mental health due to parents disbelief around mental health.  Family History:  Family History  Problem Relation Age of Onset   Hypothyroidism Mother     Social History:  Social History   Socioeconomic History   Marital status: Married    Spouse name: Not on file   Number of children: Not on file   Years of education: Not on file   Highest education level: Not on file  Occupational History   Not on file  Tobacco Use   Smoking status: Never   Smokeless tobacco: Never  Vaping Use   Vaping Use: Never used  Substance and Sexual Activity   Alcohol use: No   Drug use: No   Sexual activity: Yes    Birth control/protection: None  Other Topics  Concern   Not on file  Social History Narrative   Not on file   Social Determinants of Health   Financial Resource Strain: Not on file  Food Insecurity: No Food Insecurity (10/21/2019)   Hunger Vital Sign    Worried About Running Out of Food in the Last Year: Never true    Ran Out of Food in the Last Year: Never true  Transportation Needs: No Transportation Needs (10/21/2019)   PRAPARE - Administrator, Civil Service (Medical): No    Lack of Transportation (Non-Medical): No  Physical Activity: Not on file  Stress: Not on file  Social Connections: Not on file    Allergies: No Known Allergies  Metabolic Disorder Labs: Lab Results  Component Value Date   HGBA1C 6.4 (H) 11/02/2020   MPG 137 11/02/2020   No results found for: "PROLACTIN" Lab Results  Component Value Date   CHOL 222 (H) 11/02/2020   TRIG 168 (H) 11/02/2020   HDL 47 11/02/2020   CHOLHDL 4.7 11/02/2020   VLDL 34 11/02/2020   LDLCALC 141 (H)  11/02/2020   Lab Results  Component Value Date   TSH 0.642 11/02/2020   TSH 0.677 09/01/2019    Therapeutic Level Labs: No results found for: "LITHIUM" No results found for: "VALPROATE" No results found for: "CBMZ"  Current Medications: Current Outpatient Medications  Medication Sig Dispense Refill   atomoxetine (STRATTERA) 40 MG capsule Take 1 capsule (40 mg total) by mouth daily. 30 capsule 2   escitalopram (LEXAPRO) 10 MG tablet Take 1 tablet (10 mg total) by mouth daily. 30 tablet 2   hydrOXYzine (ATARAX/VISTARIL) 25 MG tablet Take 1 tablet (25 mg total) by mouth 3 (three) times daily as needed for anxiety. 90 tablet 0   Multiple Vitamin (MULTIVITAMIN WITH MINERALS) TABS tablet Take 1 tablet by mouth daily.     traZODone (DESYREL) 50 MG tablet Take 1 tablet (50 mg total) by mouth at bedtime as needed for sleep. 30 tablet 2   No current facility-administered medications for this visit.     Musculoskeletal: Strength & Muscle Tone: within normal  limits Gait & Station: normal Patient leans: N/A  Psychiatric Specialty Exam: Review of Systems  Psychiatric/Behavioral:  Negative for decreased concentration, dysphoric mood, hallucinations, self-injury, sleep disturbance and suicidal ideas. The patient is nervous/anxious. The patient is not hyperactive.     currently breastfeeding.There is no height or weight on file to calculate BMI.  General Appearance: Casual  Eye Contact:  Good  Speech:  Clear and Coherent and Normal Rate  Volume:  Normal  Mood:  Anxious  Affect:  Appropriate  Thought Process:  Coherent and Descriptions of Associations: Intact  Orientation:  Full (Time, Place, and Person)  Thought Content: WDL   Suicidal Thoughts:  No  Homicidal Thoughts:  No  Memory:  Immediate;   Good Recent;   Good Remote;   Good  Judgement:  Good  Insight:  Good  Psychomotor Activity:  Normal  Concentration:  Concentration: Good and Attention Span: Good  Recall:  Good  Fund of Knowledge: Good  Language: Good  Akathisia:  No  Handed:  Right  AIMS (if indicated): not done  Assets:  Communication Skills Desire for Improvement Housing Social Support Transportation  ADL's:  Intact  Cognition: WNL  Sleep:  Good   Screenings: GAD-7    Flowsheet Row Video Visit from 09/27/2021 in [facility] Counselor from 09/03/2021 in [facility] Video Visit from 07/26/2021 in [facility] Office Visit from 06/13/2021 in [facility] Counselor from 05/08/2020 in [facility]  Total GAD-7 Score 5 2 8 15 18       PHQ2-9    Flowsheet Row Video Visit from 09/27/2021 in [facility] Video Visit from 07/26/2021 in [facility] Office Visit from 06/13/2021 in [facility] Counselor from 05/08/2020 in [place] from 10/27/2019 in [facility]  PHQ-2 Total Score 1 0 3 5 1   PHQ-9 Total Score -- -- 15 19 5       Flowsheet Row Video Visit from 09/27/2021 in [facility] Video Visit from 07/26/2021 in [facility] ED from 11/02/2020 in [facility] CATEGORY Moderate Risk Moderate Risk High Risk        Assessment and Plan:  Krystal Glass is a 31 year old female with a past psychiatric history  significant for major depressive disorder with anxious distress, attention/concentration deficit, and sleep disturbances who presents to [facility] via virtual video visit for follow-up and medication management.  Patient reports no major issues or concerns regarding her medication use.  She denies experiencing depressive episodes but still endorsing some anxiety.  Patient misses her meds sometimes for 2-3 days.  Encouraged medication compliance.  Patient also has PCOS and labs done in 2022 shows high cholesterol.  Will put a referral to community health and wellness clinic for PCP needs.  Patient was also provided phone number for community health and wellness clinic.  Patient denies the need for dosage adjustments at this time and appears stable.  Patient's medications to be e-prescribed to pharmacy of choice.  Collaboration of Care: Collaboration of Care: Medication Management AEB referral sent to community health and wellness clinic for PCP, Psychiatrist AEB Dr Sande Rives, and Referral or follow-up with counselor/therapist AEB patient being seen by a licensed clinical social worker Data processing manager  Patient/Guardian was advised Release of Information must be obtained prior to any record release in order to collaborate their care with an outside provider. Patient/Guardian was advised if they have not already done so to contact  the registration department to sign all necessary forms in order for [place] to release information regarding their care.   Consent: Patient/Guardian gives verbal consent for treatment and assignment of benefits for services provided during this visit. Patient/Guardian expressed understanding and agreed to proceed.   1. Major depressive disorder, recurrent episode, moderate with anxious distress (HCC)  - traZODone (DESYREL) 50 MG tablet; Take 1 tablet (50 mg total) by mouth at bedtime as needed for sleep.  Patient does have refills. - escitalopram (LEXAPRO) 10 MG tablet; Take 1 tablet (10 mg total) by mouth daily.  Dispense: 30 tablet; Refill: 2  2. Sleep disturbances  - traZODone (DESYREL) 50 MG tablet; Take 1 tablet (50 mg total) by mouth at bedtime as needed for sleep.  Patient does have refills  3. Attention and concentration deficit  - atomoxetine (STRATTERA) 40 MG capsule; Take 1 capsule (40 mg total) by mouth daily.  Dispense: 30 capsule; Refill: 2  4. Does not have primary care provider -Referral sent for community health and wellness clinic for primary care needs.  Patient to follow up in 2 months Provider spent a total of 20 minutes with the patient/reviewing the patient's chart  Armando Reichert, MD 01/01/2022, 11:21 AM

## 2022-01-10 ENCOUNTER — Ambulatory Visit (HOSPITAL_COMMUNITY): Payer: No Payment, Other | Admitting: Clinical

## 2022-01-10 ENCOUNTER — Encounter (HOSPITAL_COMMUNITY): Payer: Self-pay | Admitting: Psychiatry

## 2022-01-25 ENCOUNTER — Ambulatory Visit (INDEPENDENT_AMBULATORY_CARE_PROVIDER_SITE_OTHER): Payer: No Payment, Other | Admitting: Clinical

## 2022-01-25 DIAGNOSIS — F331 Major depressive disorder, recurrent, moderate: Secondary | ICD-10-CM

## 2022-01-26 NOTE — Progress Notes (Signed)
THERAPIST PROGRESS NOTE Virtual Visit via Video Note  I connected with Krystal Glass on 01/25/2022 at 11:00 AM EST by a video enabled telemedicine application and verified that I am speaking with the correct person using two identifiers.  Location: Patient: parked car Provider: office   I discussed the limitations of evaluation and management by telemedicine and the availability of in person appointments. The patient expressed understanding and agreed to proceed.   Follow Up Instructions: I discussed the assessment and treatment plan with the patient. The patient was provided an opportunity to ask questions and all were answered. The patient agreed with the plan and demonstrated an understanding of the instructions.   The patient was advised to call back or seek an in-person evaluation if the symptoms worsen or if the condition fails to improve as anticipated.   Session Time: 45 minutes  Participation Level: Active  Behavioral Response: CasualAlertDepressed  Type of Therapy: Individual Therapy  Treatment Goals addressed: client will complete 80% of assigned homework  ProgressTowards Goals: Progressing  Interventions: CBT and Supportive  Summary:  Krystal Glass is a 31 y.o. female who presents for the scheduled appointment oriented times five, appropriately dressed, and friendly. Client denied hallucinations and delusions. Client reported she has been feeling depressed and overwhelmed. Client reported she has bene having a very hard time with her youngest son. Client reported he has been extremely clingy. Client reported he wants to be so close like he is in her skin. Client reported if negatively affects her ability to get anything done and/or ability to spend time with her other son unless her youngest is sleep. Client reported he screams and cries if she is not holding him or very close to him. Client reported she has bene wondering about seeking services for him to help her  with working on his behaviors. Client reported otherwise tomorrow is the anniversary of her grandmother passing. Client reported she was abruptly separated from her when he parents moved to the [place] Client reported 11 years ago when her grandmother was on her death bed they had to say goodbye to her over the phone. Client reported at the time she was focused on being strong for the family that suppressed her feelings. Client reported she never felt right wanting to bring it up because her mother "had it worse". Evidence of progress towards goal:  client reported 1 negative belief of not allowing herself to grieve.   Suicidal/Homicidal: Nowithout intent/plan  Therapist Response:  Therapist began the appointment asking the client how she has bee doing. Therapist used CBT to engage using active listening and positive emotional support. Therapist used CBT to engage and normalize the clients emotional response and discuss reinforcing her seeking additional for her son. Therapist used CBT to discuss stages of grief and challenge her negative beliefs. Therapist used CBT ask the client to identify her progress with frequency of use with coping skills with continued practice in her daily activity.    Therapist assigned the client homework to journal.    Plan: Return again in 3 weeks.  Diagnosis: major depressive disorder, recurrent episode, moderate with anxious distress  Collaboration of Care: Patient refused AEB none requested by the client.  Patient/Guardian was advised Release of Information must be obtained prior to any record release in order to collaborate their care with an outside provider. Patient/Guardian was advised if they have not already done so to contact the registration department to sign all necessary forms in order for [place] to release  information regarding their care.   Consent: Patient/Guardian gives verbal consent for treatment and assignment of benefits for services provided  during this visit. Patient/Guardian expressed understanding and agreed to proceed.   Neena Rhymes Trinidy Masterson, LCSW 01/25/2022

## 2022-02-20 ENCOUNTER — Ambulatory Visit: Payer: Self-pay | Attending: Nurse Practitioner | Admitting: Nurse Practitioner

## 2022-02-20 ENCOUNTER — Encounter: Payer: Self-pay | Admitting: Nurse Practitioner

## 2022-02-20 VITALS — BP 113/73 | HR 61 | Ht 64.0 in | Wt 225.6 lb

## 2022-02-20 DIAGNOSIS — Z8742 Personal history of other diseases of the female genital tract: Secondary | ICD-10-CM

## 2022-02-20 DIAGNOSIS — Z862 Personal history of diseases of the blood and blood-forming organs and certain disorders involving the immune mechanism: Secondary | ICD-10-CM

## 2022-02-20 DIAGNOSIS — Z8349 Family history of other endocrine, nutritional and metabolic diseases: Secondary | ICD-10-CM

## 2022-02-20 DIAGNOSIS — E7800 Pure hypercholesterolemia, unspecified: Secondary | ICD-10-CM

## 2022-02-20 DIAGNOSIS — Z8632 Personal history of gestational diabetes: Secondary | ICD-10-CM

## 2022-02-20 DIAGNOSIS — Z7689 Persons encountering health services in other specified circumstances: Secondary | ICD-10-CM

## 2022-02-20 DIAGNOSIS — F331 Major depressive disorder, recurrent, moderate: Secondary | ICD-10-CM

## 2022-02-20 NOTE — Progress Notes (Signed)
Assessment & Plan:  Krystal Glass was seen today for establish care.  Diagnoses and all orders for this visit:  Encounter to establish care  History of diabetes mellitus arising in pregnancy -     Hemoglobin A1c -     CMP14+EGFR  Hypercholesteremia -     Cancel: Lipid panel  History of anemia -     CBC with Differential  Family history of thyroid disease in sister -     Thyroid Panel With TSH  History of PCOS -     US PELVIC COMPLETE WITH TRANSVAGINAL; Future  Major depressive disorder, recurrent episode, moderate with anxious distress ([place]) Continue follow up with therapist as scheduled   Patient has been counseled on age-appropriate routine health concerns for screening and prevention. These are reviewed and up-to-date. Referrals have been placed accordingly. Immunizations are up-to-date or declined.    Subjective:   Chief Complaint  Patient presents with   Establish Care   HPI Krystal Glass 32 y.o. female presents to office today to establish care.  She has a past medical history of Acute radial nerve palsy of left upper extremity (10/12/2015), Closed displaced fracture of left clavicle (10/12/2015), Closed displaced segmental fracture of shaft of left humerus (10/09/2015), Gestational diabetes, PCOS, and Postpartum depression (currently being followed by behavioral health for major depressive disorder and anxiety)    She reports a history of PCOS diagnosed as a teen. Notes improved Mood stability with current medications (Strattera, Lexapro and trazodone). Has lost 40lbs, more focused, less stress eating. Periods are now regular but painful and she also has moderate hirsutism. Will order pelvic US and likely refer to women's clinic for treatment. States husband had a vasectomy so she does not use any contraception.    Review of Systems  Constitutional:  Negative for fever, malaise/fatigue and weight loss.  HENT: Negative.  Negative for nosebleeds.   Eyes: Negative.   Negative for blurred vision, double vision and photophobia.  Respiratory: Negative.  Negative for cough and shortness of breath.   Cardiovascular: Negative.  Negative for chest pain, palpitations and leg swelling.  Gastrointestinal: Negative.  Negative for heartburn, nausea and vomiting.  Musculoskeletal: Negative.  Negative for myalgias.  Neurological: Negative.  Negative for dizziness, focal weakness, seizures and headaches.  Psychiatric/Behavioral:  Positive for depression. Negative for suicidal ideas. The patient is nervous/anxious.     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 10/12/2015   Closed displaced fracture of left clavicle 10/12/2015   Closed displaced segmental fracture of shaft of left humerus 10/09/2015   Gestational diabetes    PCOS (polycystic ovarian syndrome)    PCOS (polycystic ovarian syndrome)    Postpartum depression     Past Surgical History:  Procedure Laterality Date   ORIF HUMERUS FRACTURE Left 10/10/2015   Procedure: OPEN REDUCTION INTERNAL FIXATION (ORIF) LEFT HUMERUS AND CLAVICLE FRACTURE;  Surgeon: Altamese Fredericksburg, MD;  Location: [place];  Service: Orthopedics;  Laterality: Left;    Family History  Problem Relation Age of Onset   Hypothyroidism Mother     Social History Reviewed with no changes to be made today.   Outpatient Medications Prior to Visit  Medication Sig Dispense Refill   atomoxetine (STRATTERA) 40 MG capsule Take 1 capsule (40 mg total) by mouth daily. 30 capsule 2   escitalopram (LEXAPRO) 10 MG tablet Take 1 tablet (10 mg total) by mouth daily. 30 tablet 2   Multiple Vitamin (MULTIVITAMIN WITH MINERALS)  TABS tablet Take 1 tablet by mouth daily.     traZODone (DESYREL) 50 MG tablet Take 1 tablet (50 mg total) by mouth at bedtime as needed for sleep. 30 tablet 2   hydrOXYzine (ATARAX/VISTARIL) 25 MG tablet Take 1 tablet (25 mg total) by mouth 3 (three) times daily as needed for anxiety. 90  tablet 0   No facility-administered medications prior to visit.    No Known Allergies     Objective:    BP 113/73   Pulse 61   Ht 5\' 4"  (1.626 m)   Wt 225 lb 9.6 oz (102.3 kg)   LMP 02/15/2022 (Exact Date)   SpO2 98%   BMI 38.72 kg/m  Wt Readings from Last 3 Encounters:  02/20/22 225 lb 9.6 oz (102.3 kg)  11/01/19 264 lb 6.4 oz (119.9 kg)  10/27/19 264 lb 9.6 oz (120 kg)    Physical Exam Vitals and nursing note reviewed.  Constitutional:      Appearance: She is well-developed.  HENT:     Head: Normocephalic and atraumatic.  Cardiovascular:     Rate and Rhythm: Normal rate and regular rhythm.     Heart sounds: Normal heart sounds. No murmur heard.    No friction rub. No gallop.  Pulmonary:     Effort: Pulmonary effort is normal. No tachypnea or respiratory distress.     Breath sounds: Normal breath sounds. No decreased breath sounds, wheezing, rhonchi or rales.  Chest:     Chest wall: No tenderness.  Abdominal:     General: Bowel sounds are normal.     Palpations: Abdomen is soft.  Musculoskeletal:        General: Normal range of motion.     Cervical back: Normal range of motion.  Skin:    General: Skin is warm and dry.  Neurological:     Mental Status: She is alert and oriented to person, place, and time.     Coordination: Coordination normal.  Psychiatric:        Behavior: Behavior normal. Behavior is cooperative.        Thought Content: Thought content normal.        Judgment: Judgment normal.          Patient has been counseled extensively about nutrition and exercise as well as the importance of adherence with medications and regular follow-up. The patient was given clear instructions to go to ER or return to medical center if symptoms don't improve, worsen or new problems develop. The patient verbalized understanding.   Follow-up: Return in about 3 months (around 05/22/2022) for physical.   Gildardo Pounds, FNP-BC [facility]  [place], [place]   02/20/2022, 9:25 AM

## 2022-02-20 NOTE — Progress Notes (Signed)
Scheduled, patient aware through mychart.

## 2022-02-20 NOTE — Progress Notes (Signed)
No concerns.  Flu

## 2022-02-21 LAB — CBC WITH DIFFERENTIAL/PLATELET
Basophils Absolute: 0 10*3/uL (ref 0.0–0.2)
Basos: 1 %
EOS (ABSOLUTE): 0.1 10*3/uL (ref 0.0–0.4)
Eos: 2 %
Hematocrit: 41.6 % (ref 34.0–46.6)
Hemoglobin: 13.6 g/dL (ref 11.1–15.9)
Immature Grans (Abs): 0 10*3/uL (ref 0.0–0.1)
Immature Granulocytes: 0 %
Lymphocytes Absolute: 2.7 10*3/uL (ref 0.7–3.1)
Lymphs: 43 %
MCH: 27.8 pg (ref 26.6–33.0)
MCHC: 32.7 g/dL (ref 31.5–35.7)
MCV: 85 fL (ref 79–97)
Monocytes Absolute: 0.4 10*3/uL (ref 0.1–0.9)
Monocytes: 6 %
Neutrophils Absolute: 3 10*3/uL (ref 1.4–7.0)
Neutrophils: 48 %
Platelets: 412 10*3/uL (ref 150–450)
RBC: 4.89 x10E6/uL (ref 3.77–5.28)
RDW: 13.2 % (ref 11.7–15.4)
WBC: 6.2 10*3/uL (ref 3.4–10.8)

## 2022-02-21 LAB — THYROID PANEL WITH TSH
Free Thyroxine Index: 2.4 (ref 1.2–4.9)
T3 Uptake Ratio: 30 % (ref 24–39)
T4, Total: 8 ug/dL (ref 4.5–12.0)
TSH: 0.65 u[IU]/mL (ref 0.450–4.500)

## 2022-02-21 LAB — CMP14+EGFR
ALT: 17 IU/L (ref 0–32)
AST: 20 IU/L (ref 0–40)
Albumin/Globulin Ratio: 1.3 (ref 1.2–2.2)
Albumin: 4.3 g/dL (ref 4.0–5.0)
Alkaline Phosphatase: 63 IU/L (ref 44–121)
BUN/Creatinine Ratio: 15 (ref 9–23)
BUN: 13 mg/dL (ref 6–20)
Bilirubin Total: 0.3 mg/dL (ref 0.0–1.2)
CO2: 24 mmol/L (ref 20–29)
Calcium: 9.7 mg/dL (ref 8.7–10.2)
Chloride: 102 mmol/L (ref 96–106)
Creatinine, Ser: 0.88 mg/dL (ref 0.57–1.00)
Globulin, Total: 3.3 g/dL (ref 1.5–4.5)
Glucose: 102 mg/dL — ABNORMAL HIGH (ref 70–99)
Potassium: 4.4 mmol/L (ref 3.5–5.2)
Sodium: 139 mmol/L (ref 134–144)
Total Protein: 7.6 g/dL (ref 6.0–8.5)
eGFR: 91 mL/min/{1.73_m2} (ref >59)

## 2022-02-21 LAB — HEMOGLOBIN A1C
Est. average glucose Bld gHb Est-mCnc: 128 mg/dL
Hgb A1c MFr Bld: 6.1 % — ABNORMAL HIGH (ref 4.8–5.6)

## 2022-03-04 ENCOUNTER — Ambulatory Visit (HOSPITAL_COMMUNITY): Payer: Self-pay

## 2022-03-04 ENCOUNTER — Telehealth (INDEPENDENT_AMBULATORY_CARE_PROVIDER_SITE_OTHER): Payer: No Payment, Other | Admitting: Psychiatry

## 2022-03-04 DIAGNOSIS — G479 Sleep disorder, unspecified: Secondary | ICD-10-CM | POA: Diagnosis not present

## 2022-03-04 DIAGNOSIS — R41840 Attention and concentration deficit: Secondary | ICD-10-CM | POA: Diagnosis not present

## 2022-03-04 DIAGNOSIS — F331 Major depressive disorder, recurrent, moderate: Secondary | ICD-10-CM | POA: Diagnosis not present

## 2022-03-04 MED ORDER — HYDROXYZINE HCL 10 MG PO TABS: 10.0000 mg | ORAL_TABLET | Freq: Two times a day (BID) | ORAL | 1 refills | Status: DC | PRN

## 2022-03-04 MED ORDER — TRAZODONE HCL 50 MG PO TABS: 50.0000 mg | ORAL_TABLET | Freq: Every evening | ORAL | 2 refills | Status: DC | PRN

## 2022-03-04 MED ORDER — ESCITALOPRAM OXALATE 10 MG PO TABS: 10.0000 mg | ORAL_TABLET | Freq: Every day | ORAL | 2 refills | Status: DC

## 2022-03-04 MED ORDER — ATOMOXETINE HCL 40 MG PO CAPS: 40.0000 mg | ORAL_CAPSULE | Freq: Every day | ORAL | 2 refills | Status: DC

## 2022-03-04 NOTE — Progress Notes (Signed)
BH MD/PA/NP OP Progress Note  Virtual Visit via Video Note  I connected with Krystal Glass on 03/04/22 at  3:00 PM EST by a video enabled telemedicine application and verified that I am speaking with the correct person using two identifiers.  Location: Patient: Home Provider: Clinic   I discussed the limitations of evaluation and management by telemedicine and the availability of in person appointments. The patient expressed understanding and agreed to proceed.  Follow Up Instructions:  I discussed the assessment and treatment plan with the patient. The patient was provided an opportunity to ask questions and all were answered. The patient agreed with the plan and demonstrated an understanding of the instructions.   The patient was advised to call back or seek an in-person evaluation if the symptoms worsen or if the condition fails to improve as anticipated.  I provided 20 minutes of non-face-to-face time during this encounter.  Armando Reichert, MD   03/04/2022 2:51 PM Krystal Glass  MRN:  [id number]  Chief Complaint:  Chief Complaint  Patient presents with   Follow-up   Medication Refill   HPI:   Krystal Glass is a 32 year old female with a past psychiatric history significant for major depressive disorder with anxious distress, attention/concentration deficit, and sleep disturbances who presents to [facility] via virtual video visit for follow-up and medication management.  Patient is currently being managed on the following medications:  Escitalopram 10 mg daily Strattera 40 mg daily Trazodone 50 mg at bedtime  PRN  Patient denies any issues or concerns regarding her current medication regimen.   Pt reports that her mood is " neutral". She reports that her depression and anxiety are well-controlled with current medications but she does get anxious episodes sometimes.  She reports that she usually feels anxious around this time of the  year. She has been sleeping and eating well.  She is reporting vivid dreams.  Discussed that it may be due to trazodone and she can stop trazodone if it is bothering her.  She verbalized understanding but wants to continue trazodone at this time.  She reports that Lexapro has been helping her with anxiety and depression and she feels the current dose is good for her at this time.  She recently visited her friend Krystal Glass who had birthday in December and had a great time. Currently, she is not reporting any suicidal ideations, homicidal ideations, auditory and visual hallucinations and paranoia.  She has been tolerating her medications well. She reports no change in her current stressors.  She is married, unemployed and lives with her husband and 2 children (4, 2).  She reports that her husband is very supportive.  She denies drinking alcohol and denies using any illicit drugs.  She does not smoke cigarettes. Discussed to add hydroxyzine to help with situational anxiety.  She agrees with the plan.  She denies any other concerns. Patient is alert and oriented x 4,  cooperative, and fully engaged in conversation during the encounter.  Her thought process is linear with coherent speech . She does not appear to be responding to internal/external stimuli .     Visit Diagnosis:    ICD-10-CM   1. Sleep disturbances  G47.9 traZODone (DESYREL) 50 MG tablet    2. Major depressive disorder, recurrent episode, moderate with anxious distress (HCC)  F33.1 traZODone (DESYREL) 50 MG tablet    escitalopram (LEXAPRO) 10 MG tablet    hydrOXYzine (ATARAX) 10 MG tablet    3. Attention  and concentration deficit  R41.840 atomoxetine (STRATTERA) 40 MG capsule      Past Psychiatric History:  Major depressive disorder with anxious distress Sleep disturbances Attention and concentration deficit  Past Medical History:  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 10/12/2015    Closed displaced fracture of left clavicle 10/12/2015   Closed displaced segmental fracture of shaft of left humerus 10/09/2015   Gestational diabetes    PCOS (polycystic ovarian syndrome)    PCOS (polycystic ovarian syndrome)    Postpartum depression     Past Surgical History:  Procedure Laterality Date   ORIF HUMERUS FRACTURE Left 10/10/2015   Procedure: OPEN REDUCTION INTERNAL FIXATION (ORIF) LEFT HUMERUS AND CLAVICLE FRACTURE;  Surgeon: Altamese Carter, MD;  Location: [facility];  Service: Orthopedics;  Laterality: Left;    Family Psychiatric History:  Patient denies a family history of psychiatric illness.  She reports there are no documents regarding family members having mental health due to parents disbelief around mental health.  Family History:  Family History  Problem Relation Age of Onset   Hypothyroidism Mother     Social History:  Social History   Socioeconomic History   Marital status: Married    Spouse name: Not on file   Number of children: Not on file   Years of education: Not on file   Highest education level: Not on file  Occupational History   Not on file  Tobacco Use   Smoking status: Never   Smokeless tobacco: Never  Vaping Use   Vaping Use: Never used  Substance and Sexual Activity   Alcohol use: Not Currently    Comment: OCC   Drug use: No   Sexual activity: Yes    Birth control/protection: None  Other Topics Concern   Not on file  Social History Narrative   Not on file   Social Determinants of Health   Financial Resource Strain: Not on file  Food Insecurity: No Food Insecurity (10/21/2019)   Hunger Vital Sign    Worried About Running Out of Food in the Last Year: Never true    Ran Out of Food in the Last Year: Never true  Transportation Needs: No Transportation Needs (10/21/2019)   PRAPARE - Hydrologist (Medical): No    Lack of Transportation (Non-Medical): No  Physical Activity: Not on file  Stress: Not on  file  Social Connections: Not on file    Allergies: No Known Allergies  Metabolic Disorder Labs: Lab Results  Component Value Date   HGBA1C 6.1 (H) 02/20/2022   MPG 137 11/02/2020   No results found for: "PROLACTIN" Lab Results  Component Value Date   CHOL 222 (H) 11/02/2020   TRIG 168 (H) 11/02/2020   HDL 47 11/02/2020   CHOLHDL 4.7 11/02/2020   VLDL 34 11/02/2020   LDLCALC 141 (H) 11/02/2020   Lab Results  Component Value Date   TSH 0.650 02/20/2022   TSH 0.642 11/02/2020    Therapeutic Level Labs: No results found for: "LITHIUM" No results found for: "VALPROATE" No results found for: "CBMZ"  Current Medications: Current Outpatient Medications  Medication Sig Dispense Refill   hydrOXYzine (ATARAX) 10 MG tablet Take 1 tablet (10 mg total) by mouth 2 (two) times daily as needed. 60 tablet 1   atomoxetine (STRATTERA) 40 MG capsule Take 1 capsule (40 mg total) by mouth daily. 30 capsule 2   escitalopram (LEXAPRO) 10 MG tablet Take  1 tablet (10 mg total) by mouth daily. 30 tablet 2   Multiple Vitamin (MULTIVITAMIN WITH MINERALS) TABS tablet Take 1 tablet by mouth daily.     traZODone (DESYREL) 50 MG tablet Take 1 tablet (50 mg total) by mouth at bedtime as needed for sleep. 30 tablet 2   No current facility-administered medications for this visit.     Musculoskeletal: Strength & Muscle Tone: within normal limits Gait & Station: normal Patient leans: N/A  Psychiatric Specialty Exam: Review of Systems  Psychiatric/Behavioral:  Negative for decreased concentration, dysphoric mood, hallucinations, self-injury, sleep disturbance and suicidal ideas. The patient is nervous/anxious. The patient is not hyperactive.     Last menstrual period 02/15/2022, currently breastfeeding.There is no height or weight on file to calculate BMI.  General Appearance: Casual  Eye Contact:  Good  Speech:  Clear and Coherent and Normal Rate  Volume:  Normal  Mood:  Anxious  Affect:   Appropriate  Thought Process:  Coherent and Descriptions of Associations: Intact  Orientation:  Full (Time, Place, and Person)  Thought Content: WDL   Suicidal Thoughts:  No  Homicidal Thoughts:  No  Memory:  Immediate;   Good Recent;   Good Remote;   Good  Judgement:  Good  Insight:  Good  Psychomotor Activity:  Normal  Concentration:  Concentration: Good and Attention Span: Good  Recall:  Good  Fund of Knowledge: Good  Language: Good  Akathisia:  No  Handed:  Right  AIMS (if indicated): not done  Assets:  Communication Skills Desire for Improvement Housing Social Support Transportation  ADL's:  Intact  Cognition: WNL  Sleep:  Good   Screenings: GAD-7    Flowsheet Row Video Visit from 09/27/2021 in [facility] from 09/03/2021 in [facility] Video Visit from 07/26/2021 in [facility] Office Visit from 06/13/2021 in [facility] Counselor from 05/08/2020 in [facility]  Total GAD-7 Score 5 2 8 15 18       PHQ2-9    Flowsheet Row Office Visit from 02/20/2022 in [facility] Video Visit from 09/27/2021 in [facility] Video Visit from 07/26/2021 in [facility] Office Visit from 06/13/2021 in [facility] Counselor from 05/08/2020 in [facility]  PHQ-2 Total Score 0 1 0 3 5  PHQ-9 Total Score 3 -- -- 15 19      Flowsheet Row Video Visit from 09/27/2021 in [facility] Video Visit from 07/26/2021 in [facility] ED from 11/02/2020 in [facility]  C-SSRS RISK CATEGORY Moderate Risk Moderate Risk High Risk        Assessment and Plan:  Krystal Glass is a 32 year old female with a past psychiatric history  significant for major depressive disorder with anxious distress, attention/concentration deficit, and sleep disturbances who presents to [facility] via virtual video visit for follow-up and medication management.  Patient reports no major issues or concerns regarding her medication use.  She denies experiencing depressive episodes but still endorsing some situational anxiety.  Will add low-dose hydroxyzine as needed to help with situational anxiety.  Patient's medications to be e-prescribed to pharmacy of choice.  Collaboration of Care: Collaboration of Care: None Patient/Guardian was advised Release of Information must be obtained prior to any record release in order to collaborate  their care with an outside provider. Patient/Guardian was advised if they have not already done so to contact the registration department to sign all necessary forms in order for [place] to release information regarding their care.   Consent: Patient/Guardian gives verbal consent for treatment and assignment of benefits for services provided during this visit. Patient/Guardian expressed understanding and agreed to proceed.   1. Major depressive disorder, recurrent episode, moderate with anxious distress (HCC)  - traZODone (DESYREL) 50 MG tablet; Take 1 tablet (50 mg total) by mouth at bedtime as needed for sleep.  Patient does have refills. - escitalopram (LEXAPRO) 10 MG tablet; Take 1 tablet (10 mg total) by mouth daily.  Dispense: 30 tablet; Refill: 2 -Start hydroxyzine 10 mg twice daily as needed for anxiety.  30 days prescription with 1 refill sent to patient's pharmacy.  2. Sleep disturbances  - traZODone (DESYREL) 50 MG tablet; Take 1 tablet (50 mg total) by mouth at bedtime as needed for sleep.  Patient does have refills  3. Attention and concentration deficit  - atomoxetine (STRATTERA) 40 MG capsule; Take 1 capsule (40 mg total) by mouth daily.  Dispense: 30 capsule; Refill:  2   Patient to follow up in 2 months Provider spent a total of 20 minutes with the patient/reviewing the patient's chart  Armando Reichert, MD 03/04/2022, 2:51 PM

## 2022-03-11 ENCOUNTER — Ambulatory Visit (HOSPITAL_COMMUNITY)
Admission: RE | Admit: 2022-03-11 | Discharge: 2022-03-11 | Disposition: A | Discharge status: Home / Self Care | Payer: Self-pay | Source: Ambulatory Visit | Attending: Nurse Practitioner | Admitting: Nurse Practitioner

## 2022-03-11 DIAGNOSIS — Z8742 Personal history of other diseases of the female genital tract: Secondary | ICD-10-CM | POA: Insufficient documentation

## 2022-03-12 ENCOUNTER — Encounter (HOSPITAL_COMMUNITY): Payer: Self-pay | Admitting: Psychiatry

## 2022-03-28 ENCOUNTER — Ambulatory Visit (INDEPENDENT_AMBULATORY_CARE_PROVIDER_SITE_OTHER): Payer: No Payment, Other | Admitting: Clinical

## 2022-03-28 DIAGNOSIS — F331 Major depressive disorder, recurrent, moderate: Secondary | ICD-10-CM

## 2022-03-28 NOTE — Progress Notes (Signed)
THERAPIST PROGRESS NOTE Virtual Visit via Video Note  I connected with Krystal Glass on 03/28/22 at  1:00 PM EST by a video enabled telemedicine application and verified that I am speaking with the correct person using two identifiers.  Location: Patient: home Provider: office   I discussed the limitations of evaluation and management by telemedicine and the availability of in person appointments. The patient expressed understanding and agreed to proceed.   Follow Up Instructions: I discussed the assessment and treatment plan with the patient. The patient was provided an opportunity to ask questions and all were answered. The patient agreed with the plan and demonstrated an understanding of the instructions.   The patient was advised to call back or seek an in-person evaluation if the symptoms worsen or if the condition fails to improve as anticipated.    Session Time: 30 minutes  Participation Level: Active  Behavioral Response: CasualAlertIrritable  Type of Therapy: Individual Therapy  Treatment Goals addressed: client will complete 80% of assigned homework  ProgressTowards Goals: Progressing  Interventions: CBT and Supportive  Summary:  Krystal Glass is a 32 y.o. female who presents for the scheduled appointment oriented x 5, appropriately dressed, and friendly.  Client had hallucinations and delusions. Client reported she has been doing well but having some irritability and worry. Client reported she has been thinking about when she will need to have an intervention with her husband. Client reported he relies heavily on marijuana use to begin, middle and end his day. Client reported her frustration of his use and the smell of it has decreased her libido. Client reported he smokes in one particular bathroom in the house which she doe not like because how it being dirty after he is done. Client reported it affects them doing things together as a family also. Client reported  he has acknowledged the issue but he gets defensive about it when she brings up wanting him to stop. Client reported she is going to think about what was discussed to lower her expectation to work on how to help him. Evidence of progress towards goal:  client reported 1 skill use of discussing her boundaries with her husband about negative behaviors.   Suicidal/Homicidal: Nowithout intent/plan  Therapist Response:  Therapist began the appointment asking the client how she has been doing since last seen. Therapist used CBT to engage using active listening and positive emotional support. Therapist used CBT to ask the client what her source of irritability is. Therapist used CBT to engage normalizing the clients emotions and discussing being flexible with her expectations to help collaborate with her husband to make a plan for improvement. Therapist used CBT ask the client to identify her progress with frequency of use with coping skills with continued practice in her daily activity.    Therapist assigned the client homework to talk with her husband about how they can work on a compromise to help reduce his substance use.    Plan: Return again in 3 weeks.  Diagnosis: mdd, recurrent episode moderate with anxious distress  Collaboration of Care: Patient refused AEB none requested by the client.  Patient/Guardian was advised Release of Information must be obtained prior to any record release in order to collaborate their care with an outside provider. Patient/Guardian was advised if they have not already done so to contact the registration department to sign all necessary forms in order for [place] to release information regarding their care.   Consent: Patient/Guardian gives verbal consent for treatment and assignment  of benefits for services provided during this visit. Patient/Guardian expressed understanding and agreed to proceed.   [place], LCSW 03/28/2022

## 2022-04-01 ENCOUNTER — Ambulatory Visit: Payer: Self-pay | Admitting: Nurse Practitioner

## 2022-04-11 ENCOUNTER — Ambulatory Visit (INDEPENDENT_AMBULATORY_CARE_PROVIDER_SITE_OTHER): Payer: No Payment, Other | Admitting: Clinical

## 2022-04-11 DIAGNOSIS — F331 Major depressive disorder, recurrent, moderate: Secondary | ICD-10-CM

## 2022-04-12 NOTE — Progress Notes (Unsigned)
THERAPIST PROGRESS NOTE Virtual Visit via Video Note  I connected with Krystal Glass on 04/11/2022 at  3:00 PM EST by a video enabled telemedicine application and verified that I am speaking with the correct person using two identifiers.  Location: Patient: home Provider: office   I discussed the limitations of evaluation and management by telemedicine and the availability of in person appointments. The patient expressed understanding and agreed to proceed.   Follow Up Instructions: I discussed the assessment and treatment plan with the patient. The patient was provided an opportunity to ask questions and all were answered. The patient agreed with the plan and demonstrated an understanding of the instructions.   The patient was advised to call back or seek an in-person evaluation if the symptoms worsen or if the condition fails to improve as anticipated.   Session Time: 40 minutes  Participation Level: Active  Behavioral Response: CasualAlertAnxious  Type of Therapy: Individual Therapy  Treatment Goals addressed: client will reduce frequency of avoidant behaviors by 50% as evidenced by self report in therapy sessions  ProgressTowards Goals: Progressing  Interventions: CBT and Supportive  Summary:  Krystal Glass is a 32 y.o. female who presents for the scheduled appointment oriented times five, appropriately dressed, and friendly. Client denied hallucinations and delusions. Client reported she has been feeling overwhelmed. Client reported some days she is forgetting to take her psych medications. Client reported she gets distracted with the kids and forgets to do things for herself. Client reported things have been chaotic around the house. Client reported her oldest is in school and he has been doing well with instruction. Client reported she was worried because he recently had a substitute and was not sure how he would do with the change. Client reported her youngest child likes  to cling to her. Client reported he screams and cries when he cannot be next to her even when their dad is home. Client reported she feels bad about venting about it but knows its a part of parenting. Client reported she is very conscious about being a attentive parent unlike how her parents were with her.  Evidence of progress towards goal:  client reported practicing routine at least 5 days out of the week to help her stay on track with remembering important task for self care.   Suicidal/Homicidal: Nowithout intent/plan  Therapist Response:  Therapist began the appointment asking the client how she has been doing since last seen. Therapist used CBT to engage using active listening and positive emotional support. Therapist used CBT to engage and ask the client to identify the source of her mood and behaviors. Therapist used CBT to normalize the clients emotional response to her stressor. Therapist used CBT ask the client to identify her progress with frequency of use with coping skills with continued practice in her daily activity.    Therapist assigned the client homework to practice behavioral activation and self care.   Plan: Return again in 3 weeks.  Diagnosis: major depressive disorder, recurrent episode moderate with anxious distress  Collaboration of Care: Patient refused AEB none requested by the client.  Patient/Guardian was advised Release of Information must be obtained prior to any record release in order to collaborate their care with an outside provider. Patient/Guardian was advised if they have not already done so to contact the registration department to sign all necessary forms in order for [place] to release information regarding their care.   Consent: Patient/Guardian gives verbal consent for treatment and assignment of benefits  for services provided during this visit. Patient/Guardian expressed understanding and agreed to proceed.   [place], LCSW 04/11/2022

## 2022-05-03 ENCOUNTER — Ambulatory Visit (INDEPENDENT_AMBULATORY_CARE_PROVIDER_SITE_OTHER): Payer: No Payment, Other | Admitting: Clinical

## 2022-05-03 DIAGNOSIS — F331 Major depressive disorder, recurrent, moderate: Secondary | ICD-10-CM | POA: Diagnosis not present

## 2022-05-03 NOTE — Progress Notes (Signed)
THERAPIST PROGRESS NOTE Virtual Visit via Video Note  I connected with Krystal Glass on 05/03/22 at  9:00 AM EDT by a video enabled telemedicine application and verified that I am speaking with the correct person using two identifiers.  Location: Patient: home Provider: office   I discussed the limitations of evaluation and management by telemedicine and the availability of in person appointments. The patient expressed understanding and agreed to proceed.   Follow Up Instructions: I discussed the assessment and treatment plan with the patient. The patient was provided an opportunity to ask questions and all were answered. The patient agreed with the plan and demonstrated an understanding of the instructions.   The patient was advised to call back or seek an in-person evaluation if the symptoms worsen or if the condition fails to improve as anticipated.    Session Time: 45 minutes  Participation Level: Active  Behavioral Response: CasualAlertIrritable  Type of Therapy: Individual Therapy  Treatment Goals addressed: client will complete 80% of assigned homework  ProgressTowards Goals: Progressing  Interventions: CBT and Supportive  Summary:  Krystal Glass is a 32 y.o. female who presents for scheduled appointment oriented x 5, appropriately dressed, and friendly.  Client denied hallucinations and delusions. Client reported on today she was feeling happy but had become overstimulated from her children and husband.  Client reported her body has been feeling stressed and tense.  Client reported her eldest son was home sick from school last week.  Client reported a plan to bring him back to school this week he had some signs of regression such as her having to practically pull him out of the car to have him going to school.  Client reported her husband has localized over time about his desire for her to homeschool him at home.  Client reported she does not want to have the  responsibility of providing him that structure and making sure he is relating everything that he needs to.  Client reported she has also had thoughts from time about seeing that her and her husband are having differing views on what they want out of life during their middle aged used.  Client reported concerns her having the thought of what if they were to separate if they can compromise.  Client reported he also overall has issues with becoming easily defensive and projecting negativity.  Client reported he was in counseling but he needs to learn how to communicate more effectively his thoughts and feelings so it does not create arguments. Evidence of progress towards goal:  client reported using assertive and open communication at least 2x per week as needed to problem solve arguments and/conflicts.   Suicidal/Homicidal: Nowithout intent/plan  Therapist Response:  Therapist began the appointment asking the client how she has been doing since last seen. Therapist used CBT to engage using active listening and positive emotional support. Therapist used CBT to engage and ask the client to identify the source of her negative emotions. Therapist used CBT to engage and discuss validating her perspective and normalizing her emotions while encouraging her to do the same as she has conversations that compromise within her marriage about differences. Therapist used CBT ask the client to identify her progress with frequency of use with coping skills with continued practice in her daily activity.    Therapist assigned the client homework to practice communication skills, validating her emotions and practicing self care.   Plan: Return again in 3 weeks.  Diagnosis: major depressive disorder, recurrent episode, moderate with  anxious distress  Collaboration of Care: Patient refused AEB none requested by the client.  Patient/Guardian was advised Release of Information must be obtained prior to any record  release in order to collaborate their care with an outside provider. Patient/Guardian was advised if they have not already done so to contact the registration department to sign all necessary forms in order for [place] to release information regarding their care.   Consent: Patient/Guardian gives verbal consent for treatment and assignment of benefits for services provided during this visit. Patient/Guardian expressed understanding and agreed to proceed.   [place], LCSW 05/03/2022

## 2022-05-21 ENCOUNTER — Ambulatory Visit: Payer: Self-pay | Admitting: Nurse Practitioner

## 2022-05-22 ENCOUNTER — Ambulatory Visit (HOSPITAL_COMMUNITY): Payer: No Payment, Other | Admitting: Clinical

## 2022-05-23 ENCOUNTER — Ambulatory Visit (INDEPENDENT_AMBULATORY_CARE_PROVIDER_SITE_OTHER): Payer: No Payment, Other | Admitting: Clinical

## 2022-05-23 DIAGNOSIS — F331 Major depressive disorder, recurrent, moderate: Secondary | ICD-10-CM | POA: Diagnosis not present

## 2022-05-23 NOTE — Progress Notes (Signed)
THERAPIST PROGRESS NOTE Virtual Visit via Video Note  I connected with Krystal Glass on 05/23/22 at  4:00 PM EDT by a video enabled telemedicine application and verified that I am speaking with the correct person using two identifiers.  Location: Patient: home Provider: office   I discussed the limitations of evaluation and management by telemedicine and the availability of in person appointments. The patient expressed understanding and agreed to proceed.  Follow Up Instructions: I discussed the assessment and treatment plan with the patient. The patient was provided an opportunity to ask questions and all were answered. The patient agreed with the plan and demonstrated an understanding of the instructions.   The patient was advised to call back or seek an in-person evaluation if the symptoms worsen or if the condition fails to improve as anticipated.   Session Time: 45 minutes  Participation Level: Active  Behavioral Response: CasualAlertDepressed  Type of Therapy: Individual Therapy  Treatment Goals addressed: client will complete 80% of assigned homework  ProgressTowards Goals: Progressing  Interventions: CBT and Supportive  Summary:  Krystal Glass is a 32 y.o. female who presents for the scheduled appointment oriented x 5, appropriately dressed, and friendly.  Client denied hallucinations and delusions. Client reported on today she has been maintaining fairly okay but has had a lot on her mind.  Client reported she and her husband recently got into an argument about several things.  Client reported her husband made a statement along the lines of she not being able to move him to the person that she wants him to be.  Client reported that brought about a moment of clarity for her that she realizes maybe she was trying to hard over the years since they began dating to "fix him".  Client reported maybe that is why she has felt some resentment over time.  Client reported she and  her husband began talking about other things such as homeschooling the boys and how his use trying to politics.  Client reported she was very surprised by some of his responses because it is not things that he would normally say.  Client reported it also caught her by surprise that he called her out of her name.  Client reported he mentioned that he was wrong for what he said but she still feels taken back about the whole conversation.  Client reported she is thinking that she will give things time to give herself more to think about how she wants to move forward with him. Evidence of progress towards goal: Client reported the use of 1 coping skill as needed throughout the week or over time which is delaying response to triggers so she can better communicate and/or respond to the limits.  Suicidal/Homicidal: Nowithout intent/plan  Therapist Response:  Therapist began the appointment asking the client how she has been doing since last seen. Therapist used CBT to engage using active listening and positive emotional support towards her thoughts and feelings. Therapist used CBT to give the client time to discuss the source of her negative emotions. Therapist used CBT to normalize the clients emotional response. Therapist used CBT to discuss conflict resolution skills. Therapist used CBT ask the client to identify her progress with frequency of use with coping skills with continued practice in her daily activity.    Therapist assigned client homework to practice self-care.   Plan: Return again in 3 weeks.  Diagnosis: Major depressive disorder, recurrent episode, moderate with anxious distress  Collaboration of Care: Other none requested  by the client.  Patient/Guardian was advised Release of Information must be obtained prior to any record release in order to collaborate their care with an outside provider. Patient/Guardian was advised if they have not already done so to contact the registration  department to sign all necessary forms in order for us to release information regarding their care.   Consent: Patient/Guardian gives verbal consent for treatment and assignment of benefits for services provided during this visit. Patient/Guardian expressed understanding and agreed to proceed.   Neena Rhymesaige Y Jmichael Gille, LCSW 05/23/2022

## 2022-06-13 ENCOUNTER — Encounter (HOSPITAL_COMMUNITY): Payer: Self-pay

## 2022-06-13 ENCOUNTER — Ambulatory Visit (HOSPITAL_COMMUNITY): Payer: No Payment, Other | Admitting: Clinical

## 2022-06-25 ENCOUNTER — Ambulatory Visit: Payer: Self-pay | Attending: Nurse Practitioner | Admitting: Nurse Practitioner

## 2022-06-25 ENCOUNTER — Encounter: Payer: Self-pay | Admitting: Nurse Practitioner

## 2022-06-25 VITALS — BP 119/74 | HR 71 | Ht 64.0 in | Wt 226.0 lb

## 2022-06-25 DIAGNOSIS — D171 Benign lipomatous neoplasm of skin and subcutaneous tissue of trunk: Secondary | ICD-10-CM

## 2022-06-25 DIAGNOSIS — L918 Other hypertrophic disorders of the skin: Secondary | ICD-10-CM

## 2022-06-25 DIAGNOSIS — Z0000 Encounter for general adult medical examination without abnormal findings: Secondary | ICD-10-CM

## 2022-06-25 NOTE — Progress Notes (Signed)
Assessment & Plan:  Krystal Glass was seen today for annual exam.  Diagnoses and all orders for this visit:  Encounter for annual physical exam  Lipoma of chest wall -     Ambulatory referral to Dermatology  Skin tag -     Ambulatory referral to Dermatology    Patient has been counseled on age-appropriate routine health concerns for screening and prevention. These are reviewed and up-to-date. Referrals have been placed accordingly. Immunizations are up-to-date or declined.    Subjective:   Chief Complaint  Patient presents with   Annual Exam   HPI Krystal Glass 32 y.o. female presents to office today for annual physical. She has a large lipoma of the left clavicle and a skin tag on the back of her neck that needs to be removed.   Review of Systems  Constitutional:  Negative for fever, malaise/fatigue and weight loss.  HENT: Negative.  Negative for nosebleeds.   Eyes: Negative.  Negative for blurred vision, double vision and photophobia.  Respiratory: Negative.  Negative for cough and shortness of breath.   Cardiovascular: Negative.  Negative for chest pain, palpitations and leg swelling.  Gastrointestinal: Negative.  Negative for heartburn, nausea and vomiting.  Genitourinary: Negative.   Musculoskeletal: Negative.  Negative for myalgias.  Skin: Negative.        Lipoma and skin tag  Neurological: Negative.  Negative for dizziness, focal weakness, seizures and headaches.  Endo/Heme/Allergies: Negative.   Psychiatric/Behavioral: Negative.  Negative for suicidal ideas.     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 10/12/2015   Closed displaced fracture of left clavicle 10/12/2015   Closed displaced segmental fracture of shaft of left humerus 10/09/2015   Gestational diabetes    PCOS (polycystic ovarian syndrome)    PCOS (polycystic ovarian syndrome)    Postpartum depression     Past Surgical History:  Procedure Laterality  Date   ORIF HUMERUS FRACTURE Left 10/10/2015   Procedure: OPEN REDUCTION INTERNAL FIXATION (ORIF) LEFT HUMERUS AND CLAVICLE FRACTURE;  Surgeon: Myrene Galas, MD;  Location: [facility] OR;  Service: Orthopedics;  Laterality: Left;    Family History  Problem Relation Age of Onset   Hypothyroidism Mother     Social History Reviewed with no changes to be made today.   Outpatient Medications Prior to Visit  Medication Sig Dispense Refill   atomoxetine (STRATTERA) 40 MG capsule Take 1 capsule (40 mg total) by mouth daily. 30 capsule 2   escitalopram (LEXAPRO) 10 MG tablet Take 1 tablet (10 mg total) by mouth daily. 30 tablet 2   hydrOXYzine (ATARAX) 10 MG tablet Take 1 tablet (10 mg total) by mouth 2 (two) times daily as needed. 60 tablet 1   Multiple Vitamin (MULTIVITAMIN WITH MINERALS) TABS tablet Take 1 tablet by mouth daily.     traZODone (DESYREL) 50 MG tablet Take 1 tablet (50 mg total) by mouth at bedtime as needed for sleep. 30 tablet 2   No facility-administered medications prior to visit.    No Known Allergies     Objective:    BP 119/74 (BP Location: Right Arm, Patient Position: Sitting, Cuff Size: Normal)   Pulse 71   Ht 5\' 4"  (1.626 m)   Wt 226 lb (102.5 kg)   LMP 06/07/2022 (Exact Date)   SpO2 100%   BMI 38.79 kg/m  Wt Readings from Last 3 Encounters:  06/25/22 226 lb (102.5 kg)  02/20/22 225 lb 9.6 oz (102.3 kg)  11/01/19 264 lb 6.4 oz (119.9 kg)    Physical Exam Vitals and nursing note reviewed.  Constitutional:      Appearance: She is well-developed.  HENT:     Head: Normocephalic and atraumatic.  Cardiovascular:     Rate and Rhythm: Normal rate and regular rhythm.     Heart sounds: Normal heart sounds. No murmur heard.    No friction rub. No gallop.  Pulmonary:     Effort: Pulmonary effort is normal. No tachypnea or respiratory distress.     Breath sounds: Normal breath sounds. No decreased breath sounds, wheezing, rhonchi or rales.  Chest:     Chest wall:  No tenderness.  Abdominal:     General: Bowel sounds are normal.     Palpations: Abdomen is soft.  Musculoskeletal:        General: Normal range of motion.     Cervical back: Normal range of motion.  Skin:    General: Skin is warm and dry.  Neurological:     Mental Status: She is alert and oriented to person, place, and time.     Coordination: Coordination normal.  Psychiatric:        Behavior: Behavior normal. Behavior is cooperative.        Thought Content: Thought content normal.        Judgment: Judgment normal.          Patient has been counseled extensively about nutrition and exercise as well as the importance of adherence with medications and regular follow-up. The patient was given clear instructions to go to ER or return to medical center if symptoms don't improve, worsen or new problems develop. The patient verbalized understanding.   Follow-up: Return in about 6 months (around 12/26/2022), or if symptoms worsen or fail to improve.   Claiborne Rigg, FNP-BC [facility] [place], [place] [phone]   06/25/2022, 3:20 PM

## 2022-06-26 ENCOUNTER — Telehealth (HOSPITAL_COMMUNITY): Payer: Self-pay | Admitting: Clinical

## 2022-06-26 ENCOUNTER — Ambulatory Visit (INDEPENDENT_AMBULATORY_CARE_PROVIDER_SITE_OTHER): Payer: No Payment, Other | Admitting: Clinical

## 2022-06-26 DIAGNOSIS — F331 Major depressive disorder, recurrent, moderate: Secondary | ICD-10-CM | POA: Diagnosis not present
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
## 2022-07-31 ENCOUNTER — Ambulatory Visit (HOSPITAL_COMMUNITY): Payer: No Payment, Other | Admitting: Clinical

## 2022-08-22 ENCOUNTER — Ambulatory Visit (INDEPENDENT_AMBULATORY_CARE_PROVIDER_SITE_OTHER): Payer: No Payment, Other | Admitting: Clinical

## 2022-08-22 DIAGNOSIS — F331 Major depressive disorder, recurrent, moderate: Secondary | ICD-10-CM

## 2022-08-22 NOTE — Progress Notes (Signed)
THERAPIST PROGRESS NOTE Virtual Visit via Video Note  I connected with Krystal Glass on 08/22/22 at 11:00 AM EDT by a video enabled telemedicine application and verified that I am speaking with the correct person using two identifiers.  Location: Patient: home Provider: office   I discussed the limitations of evaluation and management by telemedicine and the availability of in person appointments. The patient expressed understanding and agreed to proceed.   Follow Up Instructions: I discussed the assessment and treatment plan with the patient. The patient was provided an opportunity to ask questions and all were answered. The patient agreed with the plan and demonstrated an understanding of the instructions.   The patient was advised to call back or seek an in-person evaluation if the symptoms worsen or if the condition fails to improve as anticipated.    Session Time: 30 minutes  Participation Level: Active  Behavioral Response: CasualAlertAnxious  Type of Therapy: Individual Therapy  Treatment Goals addressed: client will complete 80% of assigned homework  ProgressTowards Goals: Progressing  Interventions: CBT and Supportive  Summary:  Krystal Glass is a 32 y.o. female who presents for the scheduled appointment oriented times five, appropriately dressed and friendly. Client denied hallucinations and delusions. Client reported she has been feeling overwhelmed. Client reported she has been overstimulated with caring for the kids majority of the time by herself. Client reported her husband is working longer hours and does not see him as much. Client reported she knows logically it is ok because hr provides but she wishes for time away from the kids as well as meaningful time when he is home. Client reported also she had a stressful situation helping her father coordinate some community service time. Client reported it brings her back to childhood when she had to translate  everything for her parents. Client reported she has feelings of being a bad daughter but also some resentment for a lot of responsibility being put on her when she has her own family to think about. Evidence of progress towards goal:  client reported 1 positive coping skill use of deciphering between logical thought process and emotional mindset to form decisions about changes occurring in her life.  Suicidal/Homicidal: Nowithout intent/plan  Therapist Response:  Therapist began the appointment asking the client how she has been doing since last seen. Therapist used CBT to engage using active listening and positive emotional support. Therapist used CBT to engage and ask the client about severity of anxiety and/or depressive symptoms. Therapist used CBT to engage and give the client time to discuss her thoughts about family and daily stressors. Therapist used CBT to normalize the clients emotions. Therapist used CBT ask the client to identify her progress with frequency of use with coping skills with continued practice in her daily activity.    Therapist assigned the client homework to practice self care.   Plan: Return again in 4 weeks.  Diagnosis: major depressive disorder, recurrent episode, moderate with anxious distress  Collaboration of Care: Patient refused AEB none requested by the client.  Patient/Guardian was advised Release of Information must be obtained prior to any record release in order to collaborate their care with an outside provider. Patient/Guardian was advised if they have not already done so to contact the registration department to sign all necessary forms in order for [place] to release information regarding their care.   Consent: Patient/Guardian gives verbal consent for treatment and assignment of benefits for services provided during this visit. Patient/Guardian expressed understanding and agreed to  proceed.   Neena Rhymes Rahi Chandonnet, LCSW 08/22/2022

## 2022-11-25 ENCOUNTER — Telehealth (HOSPITAL_COMMUNITY): Payer: Self-pay | Admitting: Physician Assistant

## 2022-11-28 ENCOUNTER — Other Ambulatory Visit (HOSPITAL_COMMUNITY): Payer: Self-pay | Admitting: Physician Assistant

## 2022-11-28 ENCOUNTER — Telehealth (HOSPITAL_COMMUNITY): Payer: Self-pay | Admitting: *Deleted

## 2022-11-28 DIAGNOSIS — R41840 Attention and concentration deficit: Secondary | ICD-10-CM

## 2022-11-28 DIAGNOSIS — F331 Major depressive disorder, recurrent, moderate: Secondary | ICD-10-CM

## 2022-11-28 MED ORDER — ATOMOXETINE HCL 40 MG PO CAPS: 40.0000 mg | ORAL_CAPSULE | Freq: Every day | ORAL | 0 refills | Status: DC

## 2022-11-28 MED ORDER — ESCITALOPRAM OXALATE 10 MG PO TABS: 10.0000 mg | ORAL_TABLET | Freq: Every day | ORAL | 0 refills | Status: DC

## 2022-11-28 NOTE — Telephone Encounter (Signed)
Patient called asking for refills of Lexapro and Strattera. States that she has not been missing dosages and had them refilled last month at [facility]. Has an appointment 12/12/22. She is afraid to not have her medication while waiting on her next appointment. Called to confirm Lexapro last filled at [facility] on [address] on 10/17/22 and Strattera filled  10/22/22 at a different location. No refills on file.

## 2022-11-28 NOTE — Progress Notes (Signed)
Provider was contacted by Elder Love, RN regarding patient's request for medication to be refilled prior to her next encounter.  Patient's medication to be e-prescribed to pharmacy of choice.

## 2022-12-12 ENCOUNTER — Encounter (HOSPITAL_COMMUNITY): Payer: Self-pay | Admitting: Physician Assistant

## 2022-12-12 ENCOUNTER — Ambulatory Visit (INDEPENDENT_AMBULATORY_CARE_PROVIDER_SITE_OTHER): Payer: No Payment, Other | Admitting: Physician Assistant

## 2022-12-12 DIAGNOSIS — G479 Sleep disorder, unspecified: Secondary | ICD-10-CM

## 2022-12-12 DIAGNOSIS — F331 Major depressive disorder, recurrent, moderate: Secondary | ICD-10-CM | POA: Diagnosis not present

## 2022-12-12 DIAGNOSIS — R41840 Attention and concentration deficit: Secondary | ICD-10-CM

## 2022-12-12 MED ORDER — ATOMOXETINE HCL 40 MG PO CAPS: 40.0000 mg | ORAL_CAPSULE | Freq: Every day | ORAL | 1 refills | Status: DC

## 2022-12-12 MED ORDER — ESCITALOPRAM OXALATE 20 MG PO TABS: 20.0000 mg | ORAL_TABLET | Freq: Every day | ORAL | 1 refills | Status: DC

## 2022-12-12 MED ORDER — TRAZODONE HCL 50 MG PO TABS: 50.0000 mg | ORAL_TABLET | Freq: Every evening | ORAL | 1 refills | Status: DC | PRN

## 2022-12-12 NOTE — Progress Notes (Signed)
BH MD/PA/NP OP Progress Note  12/12/2022 7:26 PM Krystal Glass  MRN:  [id number]  Chief Complaint:  Chief Complaint  Patient presents with   Follow-up   Medication Management   HPI:   Krystal Glass is a 32 year old female with a past psychiatric history significant for sleep disturbances, major depressive disorder (with anxious distress) and attention/concentration deficit who presents to [facility] for follow up and medication management. Patient was last seen by Karsten Ro, MD on 03/04/2022.  During her last encounter, patient was being managed on the following psychiatric medications:  Trazodone 50 mg at bedtime as needed Hydroxyzine 10 mg twice daily as needed Atomoxetine 40 mg daily Lexapro 10 mg daily  Patient reports that her medication regimen has been going well.  She reports that she can tell a difference when she is not on her medications and states that she was without her medications for a short period of time prior to this encounter.  When she was off her medications, patient reports that she was more irritable and not as patient.  Patient reports that she still experiences anxiety but not at the same capacity since she has been taking her medications.  She feels that her depression medication has been working well especially since the changing of the seasons.  Patient reports that she is not as prone to irritability or mood swings since being on Lexapro.  She reports that she is trying to limit her consumption of energy drinks while taking her medications stating that she experiences overwhelming feelings of mood swings when drinking energy drinks and taking her medications.  Patient endorses minimal depression and rates her depression at 3 out of 10.  She reports that she experiences depressive episodes once a week characterized by difficulty getting out of her head space.  She reports there are some days where she may take her  medications later than usual and experience being caught up in her head.  Patient endorses the following depressive symptoms: staying in her head and ruminating over past experiences that still affect her.  Patient endorses anxiety and rates her anxiety as 5-6 out of 10.  She attributes her anxiety to having an extremely busy month and stressors at home.  A PHQ-9 screen was performed with the patient scoring at 13.  A GAD-7 screen was also performed with the patient scoring a 19.  Patient is alert and oriented x 4, calm, cooperative, and fully engaged in conversation during the encounter.  Patient denies suicidal or homicidal ideations.  She further denies auditory or visual hallucinations and does not appear to be responding to internal/external stimuli.  Patient endorses good sleep and receives on average 6 to 8 hours of sleep per night.  Patient endorses good appetite and eats on average 3 meals per day.  Patient denies alcohol consumption, tobacco use, or illicit drug use.  Visit Diagnosis:    ICD-10-CM   1. Sleep disturbances  G47.9 traZODone (DESYREL) 50 MG tablet    2. Major depressive disorder, recurrent episode, moderate with anxious distress (HCC)  F33.1 traZODone (DESYREL) 50 MG tablet    escitalopram (LEXAPRO) 20 MG tablet    3. 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 10/12/2015  Closed displaced fracture of left clavicle 10/12/2015   Closed displaced segmental fracture of shaft of left humerus 10/09/2015   Gestational diabetes    PCOS (polycystic ovarian syndrome)    PCOS (polycystic ovarian syndrome)    Postpartum depression     Past Surgical History:  Procedure Laterality Date    ORIF HUMERUS FRACTURE Left 10/10/2015   Procedure: OPEN REDUCTION INTERNAL FIXATION (ORIF) LEFT HUMERUS AND CLAVICLE FRACTURE;  Surgeon: Myrene Galas, MD;  Location: [facility] OR;  Service: Orthopedics;  Laterality: Left;    Family Psychiatric History:  Patient denies a family history of psychiatric illness.  She reports there are no documents regarding family members having mental health due to parents disbelief around mental health.   Family History:  Family History  Problem Relation Age of Onset   Hypothyroidism Mother     Social History:  Social History   Socioeconomic History   Marital status: Married    Spouse name: Not on file   Number of children: Not on file   Years of education: Not on file   Highest education level: Not on file  Occupational History   Not on file  Tobacco Use   Smoking status: Never   Smokeless tobacco: Never  Vaping Use   Vaping status: Never Used  Substance and Sexual Activity   Alcohol use: Not Currently    Comment: OCC   Drug use: No   Sexual activity: Yes    Birth control/protection: None  Other Topics Concern   Not on file  Social History Narrative   Not on file   Social Determinants of Health   Financial Resource Strain: Not on file  Food Insecurity: No Food Insecurity (10/21/2019)   Hunger Vital Sign    Worried About Running Out of Food in the Last Year: Never true    Ran Out of Food in the Last Year: Never true  Transportation Needs: No Transportation Needs (10/21/2019)   PRAPARE - Administrator, Civil Service (Medical): No    Lack of Transportation (Non-Medical): No  Physical Activity: Not on file  Stress: Not on file  Social Connections: Not on file    Allergies: No Known Allergies  Metabolic Disorder Labs: Lab Results  Component Value Date   HGBA1C 6.1 (H) 02/20/2022   MPG 137 11/02/2020   No results found for: "PROLACTIN" Lab Results  Component Value Date   CHOL 222 (H) 11/02/2020   TRIG 168 (H) 11/02/2020    HDL 47 11/02/2020   CHOLHDL 4.7 11/02/2020   VLDL 34 11/02/2020   LDLCALC 141 (H) 11/02/2020   Lab Results  Component Value Date   TSH 0.650 02/20/2022   TSH 0.642 11/02/2020    Therapeutic Level Labs: No results found for: "LITHIUM" No results found for: "VALPROATE" No results found for: "CBMZ"  Current Medications: Current Outpatient Medications  Medication Sig Dispense Refill   atomoxetine (STRATTERA) 40 MG capsule Take 1 capsule (40 mg total) by mouth daily. 30 capsule 1   escitalopram (LEXAPRO) 20 MG tablet Take 1 tablet (20 mg total) by mouth daily. 30 tablet 1   hydrOXYzine (ATARAX) 10 MG tablet Take 1 tablet (10 mg total) by mouth 2 (two) times daily as needed. 60 tablet 1   Multiple Vitamin (MULTIVITAMIN WITH MINERALS) TABS tablet Take 1 tablet by mouth daily.     traZODone (DESYREL) 50 MG tablet Take 1 tablet (50 mg total) by mouth at bedtime as needed for sleep. 30 tablet 1  No current facility-administered medications for this visit.     Musculoskeletal: Strength & Muscle Tone: within normal limits Gait & Station: normal Patient leans: N/A  Psychiatric Specialty Exam: Review of Systems  Psychiatric/Behavioral:  Positive for dysphoric mood. Negative for decreased concentration, hallucinations, self-injury, sleep disturbance and suicidal ideas. The patient is nervous/anxious. The patient is not hyperactive.     Blood pressure 112/76, pulse 65, temperature 98.2 F (36.8 C), temperature source Oral, height 5\' 4"  (1.626 m), weight 226 lb 6.4 oz (102.7 kg), SpO2 99%, currently breastfeeding.Body mass index is 38.86 kg/m.  General Appearance: Casual  Eye Contact:  Good  Speech:  Clear and Coherent and Normal Rate  Volume:  Normal  Mood:  Anxious and Depressed  Affect:  Appropriate  Thought Process:  Coherent, Goal Directed, and Descriptions of Associations: Intact  Orientation:  Full (Time, Place, and Person)  Thought Content: WDL   Suicidal Thoughts:  No   Homicidal Thoughts:  No  Memory:  Immediate;   Good Recent;   Good Remote;   Good  Judgement:  Good  Insight:  Good  Psychomotor Activity:  Normal  Concentration:  Concentration: Good and Attention Span: Good  Recall:  Good  Fund of Knowledge: Good  Language: Good  Akathisia:  No  Handed:  Right  AIMS (if indicated): not done  Assets:  Communication Skills Desire for Improvement Financial Resources/Insurance Housing Social Support Transportation  ADL's:  Intact  Cognition: WNL  Sleep:  Good   Screenings: GAD-7    Flowsheet Row Office Visit from 12/12/2022 in [facility] Office Visit from 06/25/2022 in [facility] Video Visit from 09/27/2021 in [facility] Counselor from 09/03/2021 in [facility] Video Visit from 07/26/2021 in [facility]  Total GAD-7 Score 19 6 5 2 8       PHQ2-9    Flowsheet Row Office Visit from 12/12/2022 in [facility] Office Visit from 06/25/2022 in [facility] Office Visit from 02/20/2022 in [facility] Video Visit from 09/27/2021 in [facility] Video Visit from 07/26/2021 in [facility]  PHQ-2 Total Score 4 0 0 1 0  PHQ-9 Total Score 13 8 3  -- --      Flowsheet Row Video Visit from 09/27/2021 in [facility] Video Visit from 07/26/2021 in [facility] from 11/02/2020 in [facility]  C-SSRS RISK CATEGORY Moderate Risk Moderate Risk High Risk        Assessment and Plan:   Krystal Glass is a 32 year old female with a past psychiatric history significant for sleep disturbances, major depressive disorder (with anxious distress) and attention/concentration deficit who  presents to [facility] for follow up and medication management. Patient was last seen by Karsten Ro, MD on 03/04/2022.  Patient presents to the encounter stating that her medications have been mostly effective in managing her symptoms.  She reports that prior to this encounter, she was off her medications for a short period of time.  When she was off her medications, patient states that she could tell the difference between being on her medications and being without them.  She reports that she was more irritable and not as patient.  Patient reports that her Lexapro has been helpful in keeping her mood  stable especially during the changing of the seasons where the patient admits to being prone to mood swings and irritability. Patient reports that her depression is manageable but states that she has been experiencing elevated anxiety due to how busy she is and due to general life stressors. She reports that her use of hydroxyzine has not been effective in managing her anxiety.  Provider recommended increasing her Lexapro from 10 mg to 20 mg daily for the management of her anxiety.  Patient was agreeable to recommendation.  Patient's medications to be e-prescribed to pharmacy of choice.  Collaboration of Care: Collaboration of Care: Medication Management AEB Provider managing patient's psychiatric medications, Primary Care Provider AEB patient being seen by internal medicine, Psychiatrist AEB patient being followed by mental health provider at this facility, and Referral or follow-up with counselor/therapist AEB patient being seen by licensed clinical social worker at this facility  Patient/Guardian was advised Release of Information must be obtained prior to any record release in order to collaborate their care with an outside provider. Patient/Guardian was advised if they have not already done so to contact the registration department to sign all necessary forms in  order for [place] to release information regarding their care.   Consent: Patient/Guardian gives verbal consent for treatment and assignment of benefits for services provided during this visit. Patient/Guardian expressed understanding and agreed to proceed.   1. Sleep disturbances  - traZODone (DESYREL) 50 MG tablet; Take 1 tablet (50 mg total) by mouth at bedtime as needed for sleep.  Dispense: 30 tablet; Refill: 1  2. Major depressive disorder, recurrent episode, moderate with anxious distress (HCC)  - traZODone (DESYREL) 50 MG tablet; Take 1 tablet (50 mg total) by mouth at bedtime as needed for sleep.  Dispense: 30 tablet; Refill: 1 - escitalopram (LEXAPRO) 20 MG tablet; Take 1 tablet (20 mg total) by mouth daily.  Dispense: 30 tablet; Refill: 1  3. Attention and concentration deficit  - atomoxetine (STRATTERA) 40 MG capsule; Take 1 capsule (40 mg total) by mouth daily.  Dispense: 30 capsule; Refill: 1  Patient to follow up in 6 weeks Provider spent a total of 27 minutes with the patient/reviewing patient's chart  Meta Hatchet, PA 12/12/2022, 7:26 PM

## 2022-12-18 ENCOUNTER — Ambulatory Visit (INDEPENDENT_AMBULATORY_CARE_PROVIDER_SITE_OTHER): Payer: No Payment, Other | Admitting: Clinical

## 2022-12-18 DIAGNOSIS — F331 Major depressive disorder, recurrent, moderate: Secondary | ICD-10-CM | POA: Diagnosis not present

## 2022-12-22 NOTE — Progress Notes (Signed)
   THERAPIST PROGRESS NOTE Virtual Visit via Video Note  I connected with Krystal Glass on 12/18/2022 at  3:00 PM EST by a video enabled telemedicine application and verified that I am speaking with the correct person using two identifiers.  Location: Patient: home Provider: office   I discussed the limitations of evaluation and management by telemedicine and the availability of in person appointments. The patient expressed understanding and agreed to proceed.   Follow Up Instructions: I discussed the assessment and treatment plan with the patient. The patient was provided an opportunity to ask questions and all were answered. The patient agreed with the plan and demonstrated an understanding of the instructions.   The patient was advised to call back or seek an in-person evaluation if the symptoms worsen or if the condition fails to improve as anticipated.    Session Time: 45 minutes  Participation Level: Active  Behavioral Response: CasualAlertDepressed  Type of Therapy: Individual Therapy  Treatment Goals addressed:  Patient will participate in at least 80% of scheduled individual psychotherapy sessions   ProgressTowards Goals: Progressing  Interventions: CBT and Supportive  Summary:  Krystal Glass is a 32 y.o. female who presents for the scheduled appointment oriented times five, appropriately dressed and friendly. Client denied hallucinations and delusions. Client reported on today she has been having a mix of emotions. Client reported she had some disappointment conversations with her friends surrounding the election. Client reported she was encouraging her friends to vote. Client reported two of close friends were dismissive of her concerns between politics and her family. Client reported her family is of mixed status working on citizenship. Client reported the people she confides in the most were mum in validating her feelings. Client reported she is saddened to be  distanced from them at this time until they talk again possibly. Client reported she has struggled with feeling connected to a friend group as well as in her own family. Client reported otherwise she will work on focusing on herself. Evidence of progress towards goal:  client reported she practices behavioral activation activities at least 1 day per week such as painting when she feels negative emotions.   Suicidal/Homicidal: Nowithout intent/plan  Therapist Response:  Therapist began the appointment asking the client how she has been doing. Therapist used cbt to engage using active listening and positive emotional support. Therapist used cbt to ask the client to identify triggering situations which have contributed to her negative mood. Therapist used cbt to validate her perspective and teach about mindfulness of boundaries and giving space to think things through before reacting. Therapist used CBT ask the client to identify her progress with frequency of use with coping skills with continued practice in her daily activity.    Therapist assigned the client homework to practice self care.   Plan: Return again in 4 weeks.  Diagnosis: major depressive disorder, recurrent episode, moderate with anxious distress  Collaboration of Care: Patient refused AEB none requested by the client.  Patient/Guardian was advised Release of Information must be obtained prior to any record release in order to collaborate their care with an outside provider. Patient/Guardian was advised if they have not already done so to contact the registration department to sign all necessary forms in order for [place] to release information regarding their care.   Consent: Patient/Guardian gives verbal consent for treatment and assignment of benefits for services provided during this visit. Patient/Guardian expressed understanding and agreed to proceed.   Neena Rhymes Trace Wirick, LCSW 12/18/2022

## 2022-12-25 NOTE — Telephone Encounter (Signed)
Message acknowledged and reviewed.

## 2022-12-27 ENCOUNTER — Encounter: Payer: Self-pay | Admitting: Nurse Practitioner

## 2022-12-27 ENCOUNTER — Ambulatory Visit: Payer: Self-pay | Attending: Nurse Practitioner | Admitting: Nurse Practitioner

## 2022-12-27 VITALS — BP 115/78 | HR 71 | Ht 64.0 in | Wt 227.4 lb

## 2022-12-27 DIAGNOSIS — O99810 Abnormal glucose complicating pregnancy: Secondary | ICD-10-CM

## 2022-12-27 DIAGNOSIS — R7989 Other specified abnormal findings of blood chemistry: Secondary | ICD-10-CM

## 2022-12-27 DIAGNOSIS — E559 Vitamin D deficiency, unspecified: Secondary | ICD-10-CM

## 2022-12-27 DIAGNOSIS — Z8632 Personal history of gestational diabetes: Secondary | ICD-10-CM

## 2022-12-27 DIAGNOSIS — E282 Polycystic ovarian syndrome: Secondary | ICD-10-CM

## 2022-12-27 LAB — POCT GLYCOSYLATED HEMOGLOBIN (HGB A1C): HbA1c, POC (prediabetic range): 6 % (ref 5.7–6.4)

## 2022-12-27 MED ORDER — METFORMIN HCL ER 500 MG PO TB24: 500.0000 mg | ORAL_TABLET | Freq: Every day | ORAL | 1 refills | Status: DC

## 2022-12-27 NOTE — Progress Notes (Signed)
Assessment & Plan:  Krystal Glass was seen today for pcos.  Diagnoses and all orders for this visit:  History of diabetes mellitus arising in pregnancy -     metFORMIN (GLUCOPHAGE-XR) 500 MG 24 hr tablet; Take 1 tablet (500 mg total) by mouth daily with breakfast. -     Basic metabolic panel -     POCT glycosylated hemoglobin (Hb A1C)  PCOS (polycystic ovarian syndrome) -     metFORMIN (GLUCOPHAGE-XR) 500 MG 24 hr tablet; Take 1 tablet (500 mg total) by mouth daily with breakfast.  Abnormal CBC -     CBC with Differential  Vitamin D deficiency -     VITAMIN D 25 Hydroxy (Vit-D Deficiency, Fractures)    Patient has been counseled on age-appropriate routine health concerns for screening and prevention. These are reviewed and up-to-date. Referrals have been placed accordingly. Immunizations are up-to-date or declined.    Subjective:   Chief Complaint  Patient presents with   PCOS    Krystal Glass 32 y.o. female presents to office today for PCOS.   She has a history of PCOS and classic symptoms of hirsutism, metrorrhagia,  and weight gain. Would like to discuss options today. She also has a history of diabetes in pregnancy   Review of Systems  Constitutional:  Negative for fever, malaise/fatigue and weight loss.  HENT: Negative.  Negative for nosebleeds.   Eyes: Negative.  Negative for blurred vision, double vision and photophobia.  Respiratory: Negative.  Negative for cough and shortness of breath.   Cardiovascular: Negative.  Negative for chest pain, palpitations and leg swelling.  Gastrointestinal: Negative.  Negative for heartburn, nausea and vomiting.  Genitourinary:        Metrorrhagia  Musculoskeletal: Negative.  Negative for myalgias.  Neurological: Negative.  Negative for dizziness, focal weakness, seizures and headaches.  Psychiatric/Behavioral: Negative.  Negative for suicidal ideas.     Past Medical History:  Diagnosis Date   Acute radial nerve palsy of left  upper extremity 10/12/2015   Closed displaced fracture of left clavicle 10/12/2015   Closed displaced segmental fracture of shaft of left humerus 10/09/2015   Gestational diabetes    PCOS (polycystic ovarian syndrome)    PCOS (polycystic ovarian syndrome)    Postpartum depression     Past Surgical History:  Procedure Laterality Date   ORIF HUMERUS FRACTURE Left 10/10/2015   Procedure: OPEN REDUCTION INTERNAL FIXATION (ORIF) LEFT HUMERUS AND CLAVICLE FRACTURE;  Surgeon: Myrene Galas, MD;  Location: [facility] OR;  Service: Orthopedics;  Laterality: Left;    Family History  Problem Relation Age of Onset   Hypothyroidism Mother     Social History Reviewed with no changes to be made today.   Outpatient Medications Prior to Visit  Medication Sig Dispense Refill   Multiple Vitamin (MULTIVITAMIN WITH MINERALS) TABS tablet Take 1 tablet by mouth daily.     atomoxetine (STRATTERA) 40 MG capsule Take 1 capsule (40 mg total) by mouth daily. 30 capsule 1   escitalopram (LEXAPRO) 20 MG tablet Take 1 tablet (20 mg total) by mouth daily. 30 tablet 1   traZODone (DESYREL) 50 MG tablet Take 1 tablet (50 mg total) by mouth at bedtime as needed for sleep. 30 tablet 1   hydrOXYzine (ATARAX) 10 MG tablet Take 1 tablet (10 mg total) by mouth 2 (two) times daily as needed. (Patient not taking: Reported on 12/27/2022) 60 tablet 1   No facility-administered medications prior to visit.    No Known Allergies  Objective:    BP 115/78 (BP Location: Left Arm, Patient Position: Sitting, Cuff Size: Large)   Pulse 71   Ht 5\' 4"  (1.626 m)   Wt 227 lb 6.4 oz (103.1 kg)   LMP 12/05/2022 (Exact Date)   SpO2 98%   Breastfeeding No   BMI 39.03 kg/m  Wt Readings from Last 3 Encounters:  12/27/22 227 lb 6.4 oz (103.1 kg)  06/25/22 226 lb (102.5 kg)  02/20/22 225 lb 9.6 oz (102.3 kg)    Physical Exam Vitals and nursing note reviewed.  Constitutional:      Appearance: She is well-developed.  HENT:      Head: Normocephalic and atraumatic.  Cardiovascular:     Rate and Rhythm: Normal rate and regular rhythm.     Heart sounds: Normal heart sounds. No murmur heard.    No friction rub. No gallop.  Pulmonary:     Effort: Pulmonary effort is normal. No tachypnea or respiratory distress.     Breath sounds: Normal breath sounds. No decreased breath sounds, wheezing, rhonchi or rales.  Chest:     Chest wall: No tenderness.  Abdominal:     General: Bowel sounds are normal.     Palpations: Abdomen is soft.  Musculoskeletal:        General: Normal range of motion.     Cervical back: Normal range of motion.  Skin:    General: Skin is warm and dry.  Neurological:     Mental Status: She is alert and oriented to person, place, and time.     Coordination: Coordination normal.  Psychiatric:        Behavior: Behavior normal. Behavior is cooperative.        Thought Content: Thought content normal.        Judgment: Judgment normal.          Patient has been counseled extensively about nutrition and exercise as well as the importance of adherence with medications and regular follow-up. The patient was given clear instructions to go to ER or return to medical center if symptoms don't improve, worsen or new problems develop. The patient verbalized understanding.   Follow-up: Return in about 6 months (around 06/26/2023).   Claiborne Rigg, FNP-BC [facility] [place], [place] [phone]   01/26/2023, 8:57 PM

## 2022-12-28 ENCOUNTER — Other Ambulatory Visit: Payer: Self-pay | Admitting: Nurse Practitioner

## 2022-12-28 DIAGNOSIS — E559 Vitamin D deficiency, unspecified: Secondary | ICD-10-CM

## 2022-12-28 LAB — CBC WITH DIFFERENTIAL/PLATELET
Basophils Absolute: 0 10*3/uL (ref 0.0–0.2)
Basos: 1 %
EOS (ABSOLUTE): 0.1 10*3/uL (ref 0.0–0.4)
Eos: 2 %
Hematocrit: 40.1 % (ref 34.0–46.6)
Hemoglobin: 13.1 g/dL (ref 11.1–15.9)
Immature Grans (Abs): 0 10*3/uL (ref 0.0–0.1)
Immature Granulocytes: 0 %
Lymphocytes Absolute: 1.7 10*3/uL (ref 0.7–3.1)
Lymphs: 36 %
MCH: 28.2 pg (ref 26.6–33.0)
MCHC: 32.7 g/dL (ref 31.5–35.7)
MCV: 86 fL (ref 79–97)
Monocytes Absolute: 0.4 10*3/uL (ref 0.1–0.9)
Monocytes: 8 %
Neutrophils Absolute: 2.5 10*3/uL (ref 1.4–7.0)
Neutrophils: 53 %
Platelets: 333 10*3/uL (ref 150–450)
RBC: 4.64 x10E6/uL (ref 3.77–5.28)
RDW: 13.7 % (ref 11.7–15.4)
WBC: 4.8 10*3/uL (ref 3.4–10.8)

## 2022-12-28 LAB — BASIC METABOLIC PANEL
BUN/Creatinine Ratio: 16 (ref 9–23)
BUN: 11 mg/dL (ref 6–20)
CO2: 23 mmol/L (ref 20–29)
Calcium: 9.3 mg/dL (ref 8.7–10.2)
Chloride: 103 mmol/L (ref 96–106)
Creatinine, Ser: 0.67 mg/dL (ref 0.57–1.00)
Glucose: 100 mg/dL — ABNORMAL HIGH (ref 70–99)
Potassium: 4.2 mmol/L (ref 3.5–5.2)
Sodium: 140 mmol/L (ref 134–144)
eGFR: 121 mL/min/{1.73_m2} (ref >59)

## 2022-12-28 LAB — VITAMIN D 25 HYDROXY (VIT D DEFICIENCY, FRACTURES): Vit D, 25-Hydroxy: 18.2 ng/mL — ABNORMAL LOW (ref 30.0–100.0)

## 2022-12-28 MED ORDER — VITAMIN D (ERGOCALCIFEROL) 1.25 MG (50000 UNIT) PO CAPS: 50000.0000 [IU] | ORAL_CAPSULE | ORAL | 0 refills | Status: DC

## 2023-01-16 ENCOUNTER — Ambulatory Visit (INDEPENDENT_AMBULATORY_CARE_PROVIDER_SITE_OTHER): Payer: No Payment, Other | Admitting: Clinical

## 2023-01-16 DIAGNOSIS — F331 Major depressive disorder, recurrent, moderate: Secondary | ICD-10-CM

## 2023-01-18 NOTE — Progress Notes (Signed)
THERAPIST PROGRESS NOTE Virtual Visit via Video Note  I connected with Krystal Glass on 01/16/2023 at 11:00 AM EST by a video enabled telemedicine application and verified that I am speaking with the correct person using two identifiers.  Location: Patient: home Provider: office   I discussed the limitations of evaluation and management by telemedicine and the availability of in person appointments. The patient expressed understanding and agreed to proceed.   Follow Up Instructions: I discussed the assessment and treatment plan with the patient. The patient was provided an opportunity to ask questions and all were answered. The patient agreed with the plan and demonstrated an understanding of the instructions.   The patient was advised to call back or seek an in-person evaluation if the symptoms worsen or if the condition fails to improve as anticipated.   Session Time: 30 minutes  Participation Level: Active  Behavioral Response: CasualAlertAnxious  Type of Therapy: Individual Therapy  Treatment Goals addressed:  Patient will reduce frequency of avoidant behaviors by 50% as evidenced by self-report in therapy sessions   ProgressTowards Goals: Progressing  Interventions: CBT and Supportive  Summary:  Krystal Glass is a 32 y.o. female who presents for the scheduled appointment oriented times five, appropriately dressed and friendly. Client denied hallucinations and delusions. Client reported on today she has been doing somewhat better since the last appointment. Client reported she has still been processing her last conversation with her friends that she had disagreements with after the election. Client reported she is coming into accepting that she could be growing apart from them or maybe just needs a break from those people. Client reported the female friend has been vocal about not apologizing. Client reported he has also has shown himself to be self centered over time. Client  reported they do still have a group chat that they communicate in and he acts as if they are not at odds. Client reported she will think about how to make her boundaries clear. Evidence of progress towards goal:  client reported 1 positive of having realistic expectations and acknowledging cognitive patterns that act as a barrier to decisions she needs to make regarding conflict.  Suicidal/Homicidal: Nowithout intent/plan  Therapist Response:  Therapist began the appointment asking the client how she has been doing since last seen. Therapist used cbt to engage using active listening and positive emotional support. Therapist used cbt to engage and ask the client how she is coping with the changes to her friendship. Therapist used CBT to normalize her emotional response and engage with her to identify barriers that are making it difficult for her to cope. Therapist used CBT ask the client to identify her progress with frequency of use with coping skills with continued practice in her daily activity.    Therapist assigned the client homework to consider evaluating her boundaries.    Plan: Return again in 4 weeks.  Diagnosis: major depressive disorder, recurrent episode, moderate with anxious distress  Collaboration of Care: Patient refused AEB none requested by the client.  Patient/Guardian was advised Release of Information must be obtained prior to any record release in order to collaborate their care with an outside provider. Patient/Guardian was advised if they have not already done so to contact the registration department to sign all necessary forms in order for [place] to release information regarding their care.   Consent: Patient/Guardian gives verbal consent for treatment and assignment of benefits for services provided during this visit. Patient/Guardian expressed understanding and agreed to proceed.  Neena Rhymes Sydnie Sigmund, LCSW 01/16/2023

## 2023-01-23 ENCOUNTER — Telehealth (INDEPENDENT_AMBULATORY_CARE_PROVIDER_SITE_OTHER): Payer: No Payment, Other | Admitting: Physician Assistant

## 2023-01-23 ENCOUNTER — Encounter (HOSPITAL_COMMUNITY): Payer: Self-pay | Admitting: Physician Assistant

## 2023-01-23 DIAGNOSIS — F331 Major depressive disorder, recurrent, moderate: Secondary | ICD-10-CM | POA: Diagnosis not present

## 2023-01-23 DIAGNOSIS — R41840 Attention and concentration deficit: Secondary | ICD-10-CM | POA: Diagnosis not present

## 2023-01-23 DIAGNOSIS — G479 Sleep disorder, unspecified: Secondary | ICD-10-CM | POA: Diagnosis not present

## 2023-01-23 MED ORDER — ATOMOXETINE HCL 40 MG PO CAPS: 40.0000 mg | ORAL_CAPSULE | Freq: Every day | ORAL | 2 refills | Status: DC

## 2023-01-23 MED ORDER — ESCITALOPRAM OXALATE 20 MG PO TABS: 20.0000 mg | ORAL_TABLET | Freq: Every day | ORAL | 2 refills | Status: DC

## 2023-01-23 MED ORDER — TRAZODONE HCL 50 MG PO TABS: 50.0000 mg | ORAL_TABLET | Freq: Every evening | ORAL | 2 refills | Status: DC | PRN
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
## 2023-01-26 ENCOUNTER — Encounter: Payer: Self-pay | Admitting: Nurse Practitioner

## 2023-02-06 ENCOUNTER — Ambulatory Visit (INDEPENDENT_AMBULATORY_CARE_PROVIDER_SITE_OTHER): Payer: No Payment, Other | Admitting: Clinical

## 2023-02-06 DIAGNOSIS — F331 Major depressive disorder, recurrent, moderate: Secondary | ICD-10-CM | POA: Diagnosis not present
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
## 2023-02-25 ENCOUNTER — Ambulatory Visit: Payer: Self-pay | Admitting: Dermatology

## 2023-03-13 ENCOUNTER — Ambulatory Visit (INDEPENDENT_AMBULATORY_CARE_PROVIDER_SITE_OTHER): Payer: No Payment, Other | Admitting: Clinical

## 2023-03-13 DIAGNOSIS — F331 Major depressive disorder, recurrent, moderate: Secondary | ICD-10-CM

## 2023-03-15 NOTE — Progress Notes (Signed)
   THERAPIST PROGRESS NOTE Virtual Visit via Video Note  I connected with Krystal Glass on 03/13/2023 at 10:00 AM EST by a video enabled telemedicine application and verified that I am speaking with the correct person using two identifiers.  Location: Patient: home Provider: office   I discussed the limitations of evaluation and management by telemedicine and the availability of in person appointments. The patient expressed understanding and agreed to proceed.   Follow Up Instructions:  I discussed the assessment and treatment plan with the patient. The patient was provided an opportunity to ask questions and all were answered. The patient agreed with the plan and demonstrated an understanding of the instructions.   The patient was advised to call back or seek an in-person evaluation if the symptoms worsen or if the condition fails to improve as anticipated.   Session Time: 45 minutes  Participation Level: Active  Behavioral Response: CasualAlertDepressed  Type of Therapy: Individual Therapy  Treatment Goals addressed: Patient will score less than 5 on the Generalized Anxiety Disorder 7 Scale (GAD-7)   ProgressTowards Goals: Progressing  Interventions: CBT  Summary:  Krystal Glass is a 33 y.o. female who presents for the scheduled appointment oriented times five, appropriately dressed and friendly. Client denied hallucinations and delusions. Client reported she has been depressed and feeling anxious. Client reported a lot of that is coming from the current politics surrounding immigrants. Client reported she has been talking to her husband and family about potentially moving back to [place] if it comes down to it. Client reported her husband understands but has some push back about it. Client reported she has some frustration about her siblings not understanding her point of view about their parents and how life was prior to coming to [place]. Client reported the thoughts of change  brings back how she felt coming to [place] as a young child suddenly. Client reported she has no one else to talk to and vent about her thoughts on this. Evidence of progress towards goal:  client reported 1 positive of communicating her thoughts appropriately with support.   Suicidal/Homicidal: Nowithout intent/plan  Therapist Response:  Therapist began the appointment asking the client how she has been feeling since last seen. Therapist used cbt to engage using active listening and positive emotional support. Therapist used cbt to engage and give the client time to discuss her thoughts and concerns. Therapist used cbt to normalize the clients thoughts and feelings. Therapist used CBT ask the client to identify her progress with frequency of use with coping skills with continued practice in her daily activity.    Therapist assigned the client homework to practice self care.    Plan: Return again in 4 weeks.  Diagnosis: mdd, recurrent episode, moderate with anxious distress  Collaboration of Care: Patient refused AEB none requested by the client.  Patient/Guardian was advised Release of Information must be obtained prior to any record release in order to collaborate their care with an outside provider. Patient/Guardian was advised if they have not already done so to contact the registration department to sign all necessary forms in order for [place] to release information regarding their care.   Consent: Patient/Guardian gives verbal consent for treatment and assignment of benefits for services provided during this visit. Patient/Guardian expressed understanding and agreed to proceed.   Neena Rhymes Krystal Saidi, LCSW 03/13/2023

## 2023-03-27 ENCOUNTER — Encounter (HOSPITAL_COMMUNITY): Payer: Self-pay | Admitting: Physician Assistant

## 2023-03-27 ENCOUNTER — Telehealth (INDEPENDENT_AMBULATORY_CARE_PROVIDER_SITE_OTHER): Payer: No Payment, Other | Admitting: Physician Assistant

## 2023-03-27 DIAGNOSIS — F331 Major depressive disorder, recurrent, moderate: Secondary | ICD-10-CM | POA: Diagnosis not present

## 2023-03-27 DIAGNOSIS — G479 Sleep disorder, unspecified: Secondary | ICD-10-CM

## 2023-03-27 DIAGNOSIS — R41840 Attention and concentration deficit: Secondary | ICD-10-CM | POA: Diagnosis not present

## 2023-03-27 DIAGNOSIS — F411 Generalized anxiety disorder: Secondary | ICD-10-CM

## 2023-03-27 MED ORDER — ESCITALOPRAM OXALATE 5 MG PO TABS: ORAL_TABLET | ORAL | 0 refills | Status: DC

## 2023-03-27 MED ORDER — BUPROPION HCL ER (XL) 150 MG PO TB24: 150.0000 mg | ORAL_TABLET | Freq: Every day | ORAL | 1 refills | Status: DC

## 2023-03-27 MED ORDER — TRAZODONE HCL 50 MG PO TABS: 50.0000 mg | ORAL_TABLET | Freq: Every evening | ORAL | 1 refills | Status: DC | PRN

## 2023-03-27 MED ORDER — ATOMOXETINE HCL 40 MG PO CAPS: 40.0000 mg | ORAL_CAPSULE | Freq: Every day | ORAL | 1 refills | Status: DC

## 2023-03-27 MED ORDER — HYDROXYZINE HCL 10 MG PO TABS: 10.0000 mg | ORAL_TABLET | Freq: Three times a day (TID) | ORAL | 1 refills | Status: DC | PRN

## 2023-03-27 NOTE — Progress Notes (Signed)
BH MD/PA/NP OP Progress Note  Virtual Visit via Video Note  I connected with Krystal Glass on 03/27/23 at 10:30 AM EST by a video enabled telemedicine application and verified that I am speaking with the correct person using two identifiers.  Location: Patient: Home Provider: Clinic   I discussed the limitations of evaluation and management by telemedicine and the availability of in person appointments. The patient expressed understanding and agreed to proceed.  Follow Up Instructions:  I discussed the assessment and treatment plan with the patient. The patient was provided an opportunity to ask questions and all were answered. The patient agreed with the plan and demonstrated an understanding of the instructions.   The patient was advised to call back or seek an in-person evaluation if the symptoms worsen or if the condition fails to improve as anticipated.  I provided 21 minutes of non-face-to-face time during this encounter.  Meta Hatchet, PA    03/27/2023 10:59 AM Krystal Glass  MRN:  [id number]  Chief Complaint:  Chief Complaint  Patient presents with   Follow-up   Medication Management   HPI:   Krystal Glass is a 33 year old female with a past psychiatric history significant for sleep disturbances, major depressive disorder (with anxious distress) and attention/concentration deficit who presents to [facility] via virtual video visit for follow up and medication management.  Patient is currently being managed on the following psychiatric medications:  Trazodone 50 mg at bedtime as needed Hydroxyzine 10 mg twice daily as needed Atomoxetine 40 mg daily Lexapro 10 mg daily  Patient presents to the encounter stating that she has been experiencing low libido and difficulty expressing her emotions.  She states that on occasion, she will forget to take her medication.  On the day she forgets to take her medications, patient reports  that her emotions are out of control.    Since taking her medications, patient reports that she has been experiencing some weight gain.  Patient states that she has been started on metformin by her primary care provider and still continues to experience weight gain.  Patient is unsure of her weight gain is due to her use of metformin or from Lexapro.  Patient notes that she has been experiencing thoughts of binge eating.  Though she has not engaged in binge eating, she fears that she will spiral out of control and start binge eating.  By her potential issues with her medication, patient reports that she has been experiencing minimal depression.  Patient rates her depression a 1 out of 10 with 10 being most severe.  She does report that her anxiety is extremely high and rates her anxiety an 8 out of 10.  Patient endorses stressors related to family issues.  A PHQ-9 screen was performed with the patient scoring at 13.  A GAD-7 screen was also performed with the patient scoring a 15.  Patient is alert and oriented x 4, calm, cooperative, and fully engaged in conversation during the encounter.  Patient reports that she is tired and overwhelmed and has a lot on her plate.  Patient denies suicidal or homicidal ideations.  She further denies auditory or visual hallucinations and does not appear to be responding to internal/external stimuli.  Patient endorses fair sleep and receives on average 6 hours of sleep per night.  Patient endorses fair appetite and eats on average 1-2 meals per day along with snacking during the day.  Patient denies alcohol consumption, tobacco use, or illicit drug  use.  Visit Diagnosis:    ICD-10-CM   1. Anxiety state  F41.1 hydrOXYzine (ATARAX) 10 MG tablet    2. Attention and concentration deficit  R41.840 atomoxetine (STRATTERA) 40 MG capsule    3. Major depressive disorder, recurrent episode, moderate with anxious distress (HCC)  F33.1 escitalopram (LEXAPRO) 5 MG tablet     traZODone (DESYREL) 50 MG tablet    buPROPion (WELLBUTRIN XL) 150 MG 24 hr tablet    4. Sleep disturbances  G47.9 traZODone (DESYREL) 50 MG tablet      Past Psychiatric History:  Major depressive disorder with anxious distress Sleep disturbances Attention and concentration deficit  Past Medical History:  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 10/12/2015   Closed displaced fracture of left clavicle 10/12/2015   Closed displaced segmental fracture of shaft of left humerus 10/09/2015   Gestational diabetes    PCOS (polycystic ovarian syndrome)    PCOS (polycystic ovarian syndrome)    Postpartum depression     Past Surgical History:  Procedure Laterality Date   ORIF HUMERUS FRACTURE Left 10/10/2015   Procedure: OPEN REDUCTION INTERNAL FIXATION (ORIF) LEFT HUMERUS AND CLAVICLE FRACTURE;  Surgeon: Myrene Galas, MD;  Location: [facility] OR;  Service: Orthopedics;  Laterality: Left;    Family Psychiatric History:  Patient denies a family history of psychiatric illness.  She reports there are no documents regarding family members having mental health due to parents disbelief around mental health.   Family History:  Family History  Problem Relation Age of Onset   Hypothyroidism Mother     Social History:  Social History   Socioeconomic History   Marital status: Married    Spouse name: Not on file   Number of children: Not on file   Years of education: Not on file   Highest education level: Not on file  Occupational History   Not on file  Tobacco Use   Smoking status: Never   Smokeless tobacco: Never  Vaping Use   Vaping status: Never Used  Substance and Sexual Activity   Alcohol use: Not Currently    Comment: OCC   Drug use: No   Sexual activity: Yes    Birth control/protection: None  Other Topics Concern   Not on file  Social History Narrative   Not on file   Social Drivers of Health   Financial Resource Strain: Not  on file  Food Insecurity: No Food Insecurity (10/21/2019)   Hunger Vital Sign    Worried About Running Out of Food in the Last Year: Never true    Ran Out of Food in the Last Year: Never true  Transportation Needs: No Transportation Needs (10/21/2019)   PRAPARE - Administrator, Civil Service (Medical): No    Lack of Transportation (Non-Medical): No  Physical Activity: Not on file  Stress: Not on file  Social Connections: Not on file    Allergies: No Known Allergies  Metabolic Disorder Labs: Lab Results  Component Value Date   HGBA1C 6.0 12/27/2022   MPG 137 11/02/2020   No results found for: "PROLACTIN" Lab Results  Component Value Date   CHOL 222 (H) 11/02/2020   TRIG 168 (H) 11/02/2020   HDL 47 11/02/2020   CHOLHDL 4.7 11/02/2020   VLDL 34 11/02/2020   LDLCALC 141 (H) 11/02/2020   Lab Results  Component Value Date   TSH 0.650 02/20/2022   TSH 0.642 11/02/2020    Therapeutic Level  Labs: No results found for: "LITHIUM" No results found for: "VALPROATE" No results found for: "CBMZ"  Current Medications: Current Outpatient Medications  Medication Sig Dispense Refill   buPROPion (WELLBUTRIN XL) 150 MG 24 hr tablet Take 1 tablet (150 mg total) by mouth daily. 30 tablet 1   hydrOXYzine (ATARAX) 10 MG tablet Take 1 tablet (10 mg total) by mouth 3 (three) times daily as needed. 75 tablet 1   atomoxetine (STRATTERA) 40 MG capsule Take 1 capsule (40 mg total) by mouth daily. 30 capsule 1   escitalopram (LEXAPRO) 5 MG tablet Patient to take 1 tablet (5 mg total) for 7 days before discontinuing escitalopram 7 tablet 0   metFORMIN (GLUCOPHAGE-XR) 500 MG 24 hr tablet Take 1 tablet (500 mg total) by mouth daily with breakfast. 90 tablet 1   Multiple Vitamin (MULTIVITAMIN WITH MINERALS) TABS tablet Take 1 tablet by mouth daily.     traZODone (DESYREL) 50 MG tablet Take 1 tablet (50 mg total) by mouth at bedtime as needed for sleep. 30 tablet 1   Vitamin D,  Ergocalciferol, (DRISDOL) 1.25 MG (50000 UNIT) CAPS capsule Take 1 capsule (50,000 Units total) by mouth every 7 (seven) days. TAKE ONCE A WEEK 12 capsule 0   No current facility-administered medications for this visit.     Musculoskeletal: Strength & Muscle Tone: within normal limits Gait & Station: normal Patient leans: N/A  Psychiatric Specialty Exam: Review of Systems  Psychiatric/Behavioral:  Positive for sleep disturbance. Negative for decreased concentration, dysphoric mood, hallucinations, self-injury and suicidal ideas. The patient is nervous/anxious. The patient is not hyperactive.     There were no vitals taken for this visit.There is no height or weight on file to calculate BMI.  General Appearance: Casual  Eye Contact:  Good  Speech:  Clear and Coherent and Normal Rate  Volume:  Normal  Mood:  Anxious  Affect:  Appropriate  Thought Process:  Coherent, Goal Directed, and Descriptions of Associations: Intact  Orientation:  Full (Time, Place, and Person)  Thought Content: WDL   Suicidal Thoughts:  No  Homicidal Thoughts:  No  Memory:  Immediate;   Good Recent;   Good Remote;   Good  Judgement:  Good  Insight:  Good  Psychomotor Activity:  Normal  Concentration:  Concentration: Good and Attention Span: Good  Recall:  Good  Fund of Knowledge: Good  Language: Good  Akathisia:  No  Handed:  Right  AIMS (if indicated): not done  Assets:  Communication Skills Desire for Improvement Financial Resources/Insurance Housing Social Support Transportation  ADL's:  Intact  Cognition: WNL  Sleep:  Fair   Screenings: GAD-7    Flowsheet Row Video Visit from 03/27/2023 in [facility] Video Visit from 01/23/2023 in [facility] Office Visit from 12/27/2022 in [facility] [place]. [facility] Office Visit from 12[facility]12/2022 in [facility] Office Visit  from 06/25/2022 in [facility] Of [place]. [facility]  Total GAD-7 Score 15 12 16 19 6       PHQ2-9    Flowsheet Row Video Visit from 03/27/2023 in [facility] Video Visit from 01/23/2023 in [facility] Office Visit from 12/27/2022 in [facility] [place] - A Dept Of [place]. [facility] Office Visit from 12/12/2022 in [facility] Office Visit from 06/25/2022  in [facility] [place] - A Dept Of [place]. [facility]  PHQ-2 Total Score 3 2 3 4  0  PHQ-9 Total Score 13 14 11 13 8       Flowsheet Row Video Visit from 03/27/2023 in [facility] Video Visit from 01/23/2023 in [facility] Video Visit from 09/27/2021 in [facility]  C-SSRS RISK CATEGORY Moderate Risk Moderate Risk Moderate Risk        Assessment and Plan:   Krystal Glass is a 34 year old female with a past psychiatric history significant for sleep disturbances, major depressive disorder (with anxious distress) and attention/concentration deficit who presents to [facility] via virtual video visit for follow up and medication management.  Patient presents to the encounter stating that she has been having some issues with her medication.  Though patient endorses minimal depression, she reports that she has difficulty expressing emotion.  She also states that she has been experiencing weight gain that may be attributed to her use of Lexapro.  Patient continues to endorse elevated anxiety as well.  Patient is currently on metformin to address her weight gain.  Provider recommended patient discontinue her use of Lexapro and start Wellbutrin for the management of her depressive symptoms.  Provider recommended patient be placed on Wellbutrin XL150 mg  24-hour tablet daily for the management of her depression.  Patient to discontinue her use of Lexapro.  Patient was instructed to take Lexapro 10 mg for 7 days followed by 5 mg for 7 more days before discontinuing.  Patient was agreeable to recommendation.   Patient to also be placed on hydroxyzine 10 mg 3 times daily as needed for the management of her anxiety.  Patient vocalized understanding.  Patient's medications to be e-prescribed to pharmacy of choice.  Collaboration of Care: Collaboration of Care: Medication Management AEB Provider managing patient's psychiatric medications, Primary Care Provider AEB patient being seen by internal medicine, Psychiatrist AEB patient being followed by mental health provider at this facility, and Referral or follow-up with counselor/therapist AEB patient being seen by licensed clinical social worker at this facility  Patient/Guardian was advised Release of Information must be obtained prior to any record release in order to collaborate their care with an outside provider. Patient/Guardian was advised if they have not already done so to contact the registration department to sign all necessary forms in order for [place] to release information regarding their care.   Consent: Patient/Guardian gives verbal consent for treatment and assignment of benefits for services provided during this visit. Patient/Guardian expressed understanding and agreed to proceed.   1. Attention and concentration deficit  - atomoxetine (STRATTERA) 40 MG capsule; Take 1 capsule (40 mg total) by mouth daily.  Dispense: 30 capsule; Refill: 1  2. Major depressive disorder, recurrent episode, moderate with anxious distress (HCC)  - escitalopram (LEXAPRO) 5 MG tablet; Patient to take 1 tablet (5 mg total) for 7 days before discontinuing escitalopram  Dispense: 7 tablet; Refill: 0 - traZODone (DESYREL) 50 MG tablet; Take 1 tablet (50 mg total) by mouth at bedtime as needed for sleep.  Dispense: 30  tablet; Refill: 1 - buPROPion (WELLBUTRIN XL) 150 MG 24 hr tablet; Take 1 tablet (150 mg total) by mouth daily.  Dispense: 30 tablet; Refill: 1  3. Anxiety state (Primary)  - hydrOXYzine (ATARAX) 10 MG tablet; Take 1 tablet (10 mg total) by mouth 3 (three) times daily as needed.  Dispense:  75 tablet; Refill: 1  4. Sleep disturbances  - traZODone (DESYREL) 50 MG tablet; Take 1 tablet (50 mg total) by mouth at bedtime as needed for sleep.  Dispense: 30 tablet; Refill: 1  Patient to follow up in 6 weeks Provider spent a total of 21 minutes with the patient/reviewing patient's chart  Meta Hatchet, PA 03/27/2023, 10:59 AM

## 2023-04-03 ENCOUNTER — Ambulatory Visit (INDEPENDENT_AMBULATORY_CARE_PROVIDER_SITE_OTHER): Payer: No Payment, Other | Admitting: Clinical

## 2023-04-03 DIAGNOSIS — F331 Major depressive disorder, recurrent, moderate: Secondary | ICD-10-CM | POA: Diagnosis not present

## 2023-04-08 ENCOUNTER — Other Ambulatory Visit (HOSPITAL_COMMUNITY): Payer: Self-pay | Admitting: Physician Assistant

## 2023-04-08 DIAGNOSIS — F331 Major depressive disorder, recurrent, moderate: Secondary | ICD-10-CM

## 2023-04-14 ENCOUNTER — Ambulatory Visit (INDEPENDENT_AMBULATORY_CARE_PROVIDER_SITE_OTHER): Payer: No Payment, Other | Admitting: Clinical

## 2023-04-14 DIAGNOSIS — F411 Generalized anxiety disorder: Secondary | ICD-10-CM

## 2023-04-14 NOTE — Progress Notes (Signed)
 THERAPIST PROGRESS NOTE Virtual Visit via Video Note  I connected with [name] on 04/14/23 at  9:00 AM EST by a video enabled telemedicine application and verified that I am speaking with the correct person using two identifiers.  Location: Patient: home Provider: office   I discussed the limitations of evaluation and management by telemedicine and the availability of in person appointments. The patient expressed understanding and agreed to proceed.  Follow Up Instructions: I discussed the assessment and treatment plan with the patient. The patient was provided an opportunity to ask questions and all were answered. The patient agreed with the plan and demonstrated an understanding of the instructions.   The patient was advised to call back or seek an in-person evaluation if the symptoms worsen or if the condition fails to improve as anticipated.   Session Time: 45 minutes  Participation Level: Active  Behavioral Response: CasualAlertEuthymic  Type of Therapy: Individual Therapy  Treatment Goals addressed: Patient will complete at least 80% of assigned homework   ProgressTowards Goals: Progressing  Interventions: CBT  Summary:  Denika Krone is a 33 y.o. female who presents for the scheduled appointment oriented x 5, appropriately dressed, and friendly.  Client denied hallucinations and delusions. Client reported on today she is doing well.  Client reported over the weekend they celebrated her oldest son's birthday.  Client reported it was a good time with family and her son's friends.  Client reported this birthday she really reflected on her parenting with him now that he is getting out of the thought later stage.  Client reported she was discussing with her son how learning to play with her kids over time has changed because she had to be apparent to help her parents once they moved to the [place].  Client reported remembering when her oldest son was a baby they had  to learn about autism.  Client reported she is had feelings and thoughts of having some regret and feeling bad about how she handled certain situations when they had to understand that he was learning to express himself when he could not.  Client reported she has a close bond with both of her children but she does not want them to fill intimidated by her.  Client reported they have come a long way with helping them to cope as well as adjusting their parenting techniques to support them both. Evidence of progress towards goal:  client reported 1 positive of being able to regulate her emotions and thoughts.  Suicidal/Homicidal: Nowithout intent/plan  Therapist Response:  Therapist began the appointment asking the client how she has been doing since last seen. Therapist used cbt to engage with active listening and positive emotional support. Therapist used cbt to engage and ask the client about contributing factors to her mood. Therapist used cbt to normalize the clients emotional response. Therapist used cbt to positively reinforce the clients mindfulness. Therapist used CBT ask the client to identify her progress with frequency of use with coping skills with continued practice in her daily activity.    Therapist assigned the client homework to practice self care.  Plan: Return again in 3 weeks.  Diagnosis: GAD  Collaboration of Care: Patient refused AEB none requested.  Patient/Guardian was advised Release of Information must be obtained prior to any record release in order to collaborate their care with an outside provider. Patient/Guardian was advised if they have not already done so to contact the registration department to sign all necessary forms in order  for [place] to release information regarding their care.   Consent: Patient/Guardian gives verbal consent for treatment and assignment of benefits for services provided during this visit. Patient/Guardian expressed understanding and agreed to  proceed.   Neena Rhymes Breelyn Icard, LCSW 04/14/2023

## 2023-04-19 NOTE — Progress Notes (Signed)
   THERAPIST PROGRESS NOTE Virtual Visit via Video Note  I connected with Krystal Glass on 04/03/2023 at 11:00 AM EST by a video enabled telemedicine application and verified that I am speaking with the correct person using two identifiers.  Location: Patient: home Provider: office   I discussed the limitations of evaluation and management by telemedicine and the availability of in person appointments. The patient expressed understanding and agreed to proceed.   Follow Up Instructions: I discussed the assessment and treatment plan with the patient. The patient was provided an opportunity to ask questions and all were answered. The patient agreed with the plan and demonstrated an understanding of the instructions.   The patient was advised to call back or seek an in-person evaluation if the symptoms worsen or if the condition fails to improve as anticipated.   Session Time: 30 minutes  Participation Level: Active  Behavioral Response: CasualAlertEuthymic  Type of Therapy: Individual Therapy  Treatment Goals addressed: Patient will score less than 5 on the Generalized Anxiety Disorder 7 Scale (GAD-7   ProgressTowards Goals: Progressing  Interventions: CBT  Summary:  Krystal Glass is a 33 y.o. female who presents for the scheduled appointment oriented times five, appropriately dressed and friendly. Client denied hallucinations and delusions. Client reported she is doing fairly okay. Client reported she continues to have the same worries about her family and concern with politics. Client reported she has had multiple conversations with her husband about it. Client reported otherwise she had her medication adjusted with the doctor and is doing well on it. Evidence of progress towards goal:  client reported medication compliance 7 days per week.  Suicidal/Homicidal: Nowithout intent/plan  Therapist Response:  Therapist began the appointment asking the client how she has been  doing. Therapist used cbt to engage using active listening and positive emotional support. Therapist used cbt to engage and give her time to discuss her thoughts and concerns. Therapist used cbt to teach the client about positive coping skills. Therapist used CBT ask the client to identify her progress with frequency of use with coping skills with continued practice in her daily activity.    Therapist assigned the client homework to practice self care.   Plan: Return again in 4 weeks.  Diagnosis: mdd, recurrent episode, moderate with anxious distress  Collaboration of Care: Patient refused AEB none requested by the client.  Patient/Guardian was advised Release of Information must be obtained prior to any record release in order to collaborate their care with an outside provider. Patient/Guardian was advised if they have not already done so to contact the registration department to sign all necessary forms in order for [place] to release information regarding their care.   Consent: Patient/Guardian gives verbal consent for treatment and assignment of benefits for services provided during this visit. Patient/Guardian expressed understanding and agreed to proceed.   Neena Rhymes Donyetta Ogletree, LCSW 04/03/2023

## 2023-05-06 ENCOUNTER — Ambulatory Visit (HOSPITAL_COMMUNITY): Payer: No Payment, Other | Admitting: Clinical

## 2023-05-07 ENCOUNTER — Ambulatory Visit (HOSPITAL_COMMUNITY): Payer: No Payment, Other | Admitting: Clinical

## 2023-05-07 ENCOUNTER — Telehealth (HOSPITAL_COMMUNITY): Payer: Self-pay | Admitting: Clinical

## 2023-05-07 ENCOUNTER — Encounter (HOSPITAL_COMMUNITY): Payer: Self-pay

## 2023-05-07 NOTE — Telephone Encounter (Signed)
 Therapist attempted to send the client a link for the scheduled therapy video visit.  Therapist was experiencing technical issues with care agility and unable to complete a video visit.  Therapist attempted a telephone call to the client to reach out by phone.  Therapist was not able to get in touch with the client.  Therapist left a voicemail stating the reason for the call and instructed her to call the office back if she would like to reschedule for sooner upcoming appointment.

## 2023-05-08 ENCOUNTER — Encounter (HOSPITAL_COMMUNITY): Payer: Self-pay | Admitting: Physician Assistant

## 2023-05-08 ENCOUNTER — Telehealth (HOSPITAL_COMMUNITY): Payer: No Payment, Other | Admitting: Physician Assistant

## 2023-05-08 DIAGNOSIS — F331 Major depressive disorder, recurrent, moderate: Secondary | ICD-10-CM

## 2023-05-08 DIAGNOSIS — F411 Generalized anxiety disorder: Secondary | ICD-10-CM | POA: Diagnosis not present

## 2023-05-08 DIAGNOSIS — R41840 Attention and concentration deficit: Secondary | ICD-10-CM | POA: Diagnosis not present

## 2023-05-08 DIAGNOSIS — G479 Sleep disorder, unspecified: Secondary | ICD-10-CM | POA: Diagnosis not present

## 2023-05-08 MED ORDER — ATOMOXETINE HCL 40 MG PO CAPS: 40.0000 mg | ORAL_CAPSULE | Freq: Every day | ORAL | 2 refills | Status: DC

## 2023-05-08 MED ORDER — TRAZODONE HCL 50 MG PO TABS: 50.0000 mg | ORAL_TABLET | Freq: Every evening | ORAL | 2 refills | Status: DC | PRN

## 2023-05-08 MED ORDER — HYDROXYZINE HCL 10 MG PO TABS: 10.0000 mg | ORAL_TABLET | Freq: Three times a day (TID) | ORAL | 1 refills | Status: DC | PRN

## 2023-05-08 MED ORDER — BUPROPION HCL ER (XL) 150 MG PO TB24: 150.0000 mg | ORAL_TABLET | Freq: Every day | ORAL | 2 refills | Status: DC

## 2023-05-08 NOTE — Progress Notes (Signed)
 [facility] MD/PA/NP OP Progress Note  Virtual Visit via Video Note  I connected with Krystal Glass on 05/08/23 at 10:30 AM EDT by a video enabled telemedicine application and verified that I am speaking with the correct person using two identifiers.  Location: Patient: Home Provider: Clinic   I discussed the limitations of evaluation and management by telemedicine and the availability of in person appointments. The patient expressed understanding and agreed to proceed.  Follow Up Instructions:  I discussed the assessment and treatment plan with the patient. The patient was provided an opportunity to ask questions and all were answered. The patient agreed with the plan and demonstrated an understanding of the instructions.   The patient was advised to call back or seek an in-person evaluation if the symptoms worsen or if the condition fails to improve as anticipated.  I provided 22 minutes of non-face-to-face time during this encounter.  Meta Hatchet, PA    05/08/2023 8:20 PM Musette Kisamore  MRN:  [id number]  Chief Complaint:  Chief Complaint  Patient presents with   Follow-up   Medication Refill   HPI:   Krystal Glass is a 33 year old female with a past psychiatric history significant for sleep disturbances, major depressive disorder (with anxious distress) and attention/concentration deficit who presents to [facility] via virtual video visit for follow up and medication management.  Patient is currently being managed on the following psychiatric medications:  Trazodone 50 mg at bedtime as needed Hydroxyzine 10 mg twice daily as needed Atomoxetine 40 mg daily Bupropion (Wellbutrin XL) 150 mg 24-hour tablet daily  Patient presents today encounter stating that she is trying to figure out when to take her hydroxyzine for the most benefit.  She reports that she has been moody lately and notes that she went through a few days where she was  extremely irritable.  Despite her irritability, patient reports that her mood has been mostly fine.  Patient endorses depression and rates her depression at 3 out of 10 with 10 being most severe.  Patient endorses depressive episodes 2 to 3 days out of the week.  Patient endorses the following depressive symptoms: Irritability and ruminating.  Patient also endorses anxiety and rates her anxiety as 5 out of 10.  Patient denies any new stressors at this time.  A PHQ-9 screen was performed with the patient scoring a 16.  A GAD-7 screen was also performed with the patient scoring at 17.  Patient is alert and oriented x 4, calm, cooperative, and fully engaged in conversation during the encounter.  Patient describes her mood as tired and frustrated.  Patient exhibits depressed mood with appropriate affect.  Patient denies suicidal or homicidal ideations.  She further denies auditory or visual hallucinations and does not appear to be responding to internal/external stimuli.  Patient endorses fair sleep and states that she often lies awake at night until roughly 2 in the morning.  Patient endorses fair appetite and eats on average 1-2 meals per day.  Patient denies alcohol consumption, tobacco use, or illicit drug use.  Visit Diagnosis:    ICD-10-CM   1. Major depressive disorder, recurrent episode, moderate with anxious distress (HCC)  F33.1 traZODone (DESYREL) 50 MG tablet    buPROPion (WELLBUTRIN XL) 150 MG 24 hr tablet    2. Sleep disturbances  G47.9 traZODone (DESYREL) 50 MG tablet    3. Attention and concentration deficit  R41.840 atomoxetine (STRATTERA) 40 MG capsule    4. 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 10/12/2015   Closed  displaced fracture of left clavicle 10/12/2015   Closed displaced segmental fracture of shaft of left humerus 10/09/2015   Gestational diabetes    PCOS (polycystic ovarian syndrome)    PCOS (polycystic ovarian syndrome)    Postpartum depression     Past Surgical History:  Procedure Laterality Date   ORIF HUMERUS FRACTURE Left 10/10/2015   Procedure: OPEN REDUCTION INTERNAL FIXATION (ORIF) LEFT HUMERUS AND CLAVICLE FRACTURE;  Surgeon: Myrene Galas, MD;  Location: [facility] OR;  Service: Orthopedics;  Laterality: Left;    Family Psychiatric History:  Patient denies a family history of psychiatric illness.  She reports there are no documents regarding family members having mental health due to parents disbelief around mental health.   Family History:  Family History  Problem Relation Age of Onset   Hypothyroidism Mother     Social History:  Social History   Socioeconomic History   Marital status: Married    Spouse name: Not on file   Number of children: Not on file   Years of education: Not on file   Highest education level: Not on file  Occupational History   Not on file  Tobacco Use   Smoking status: Never   Smokeless tobacco: Never  Vaping Use   Vaping status: Never Used  Substance and Sexual Activity   Alcohol use: Not Currently    Comment: OCC   Drug use: No   Sexual activity: Yes    Birth control/protection: None  Other Topics Concern   Not on file  Social History Narrative   Not on file   Social Drivers of Health   Financial Resource Strain: Not on file  Food Insecurity: No Food Insecurity (10/21/2019)   Hunger Vital Sign    Worried About Running Out of Food in the Last Year: Never true    Ran Out of Food in the Last Year: Never true  Transportation Needs: No Transportation Needs (10/21/2019)   PRAPARE - Administrator, Civil Service (Medical): No    Lack of Transportation (Non-Medical): No  Physical Activity: Not on file  Stress: Not on file  Social  Connections: Not on file    Allergies: No Known Allergies  Metabolic Disorder Labs: Lab Results  Component Value Date   HGBA1C 6.0 12/27/2022   MPG 137 11/02/2020   No results found for: "PROLACTIN" Lab Results  Component Value Date   CHOL 222 (H) 11/02/2020   TRIG 168 (H) 11/02/2020   HDL 47 11/02/2020   CHOLHDL 4.7 11/02/2020   VLDL 34 11/02/2020   LDLCALC 141 (H) 11/02/2020   Lab Results  Component Value Date   TSH 0.650 02/20/2022   TSH 0.642 11/02/2020    Therapeutic Level Labs: No results found for: "LITHIUM" No results found for: "VALPROATE" No results found for: "CBMZ"  Current Medications: Current Outpatient Medications  Medication Sig Dispense Refill   atomoxetine (STRATTERA) 40 MG capsule Take 1 capsule (40 mg total) by mouth daily. 30 capsule 2   buPROPion (WELLBUTRIN XL) 150 MG 24 hr tablet Take 1 tablet (150 mg total) by mouth daily. 30 tablet 2   escitalopram (LEXAPRO) 5 MG tablet Patient to take 1 tablet (5 mg total)  for 7 days before discontinuing escitalopram 7 tablet 0   hydrOXYzine (ATARAX) 10 MG tablet Take 1 tablet (10 mg total) by mouth 3 (three) times daily as needed. 75 tablet 1   metFORMIN (GLUCOPHAGE-XR) 500 MG 24 hr tablet Take 1 tablet (500 mg total) by mouth daily with breakfast. 90 tablet 1   Multiple Vitamin (MULTIVITAMIN WITH MINERALS) TABS tablet Take 1 tablet by mouth daily.     traZODone (DESYREL) 50 MG tablet Take 1 tablet (50 mg total) by mouth at bedtime as needed for sleep. 30 tablet 2   Vitamin D, Ergocalciferol, (DRISDOL) 1.25 MG (50000 UNIT) CAPS capsule Take 1 capsule (50,000 Units total) by mouth every 7 (seven) days. TAKE ONCE A WEEK 12 capsule 0   No current facility-administered medications for this visit.     Musculoskeletal: Strength & Muscle Tone: within normal limits Gait & Station: normal Patient leans: N/A  Psychiatric Specialty Exam: Review of Systems  Psychiatric/Behavioral:  Positive for sleep disturbance.  Negative for decreased concentration, dysphoric mood, hallucinations, self-injury and suicidal ideas. The patient is nervous/anxious. The patient is not hyperactive.     There were no vitals taken for this visit.There is no height or weight on file to calculate BMI.  General Appearance: Casual  Eye Contact:  Good  Speech:  Clear and Coherent and Normal Rate  Volume:  Normal  Mood:  Anxious  Affect:  Appropriate  Thought Process:  Coherent, Goal Directed, and Descriptions of Associations: Intact  Orientation:  Full (Time, Place, and Person)  Thought Content: WDL   Suicidal Thoughts:  No  Homicidal Thoughts:  No  Memory:  Immediate;   Good Recent;   Good Remote;   Good  Judgement:  Good  Insight:  Good  Psychomotor Activity:  Normal  Concentration:  Concentration: Good and Attention Span: Good  Recall:  Good  Fund of Knowledge: Good  Language: Good  Akathisia:  No  Handed:  Right  AIMS (if indicated): not done  Assets:  Communication Skills Desire for Improvement Financial Resources/Insurance Housing Social Support Transportation  ADL's:  Intact  Cognition: WNL  Sleep:  Fair   Screenings: GAD-7    Flowsheet Row Video Visit from 05/08/2023 in [facility] Video Visit from 03/27/2023 in [facility] Video Visit from 01/23/2023 in [facility] Office Visit from 12/27/2022 in [facility] [place] Office Visit from 12/12/2022 in [facility]  Total GAD-7 Score 17 15 12 16 19       PHQ2-9    Flowsheet Row Video Visit from 05/08/2023 in [facility] Video Visit from 03/27/2023 in [facility] Video Visit from 01/23/2023 in [facility] Office Visit from 12/27/2022 in [facility] [place]. [facility] Office Visit from 12/12/2022 in [facility]  PHQ-2 Total Score 3 3 2 3 4   PHQ-9 Total Score 16 13 14 11 13       Flowsheet Row Video Visit from 05/08/2023 in [facility] Video Visit from 03/27/2023 in [facility] Video Visit from 01/23/2023 in [facility]  C-SSRS RISK CATEGORY Moderate Risk Moderate Risk Moderate Risk        Assessment and Plan:   Krystal Glass is a 33 year old female with a past  psychiatric history significant for sleep disturbances, major depressive disorder (with anxious distress) and attention/concentration deficit who presents to [facility] via virtual video visit for follow up and medication management.  Patient reports that she has been taking her medications regularly.  She reports that she has been trying to figure out the best time to take her hydroxyzine to get the most benefit from the medication.  Despite taking her medications regularly, patient endorses depressive symptoms characterized by irritability and ruminating.  She also endorses some anxiety.  Despite these ongoing symptoms, patient would like to continue taking her medications as prescribed.  Patient's medications to be e-prescribed to pharmacy of choice.  Collaboration of Care: Collaboration of Care: Medication Management AEB Provider managing patient's psychiatric medications, Primary Care Provider AEB patient being seen by internal medicine, Psychiatrist AEB patient being followed by mental health provider at this facility, and Referral or follow-up with counselor/therapist AEB patient being seen by licensed clinical social worker at this facility  Patient/Guardian was advised Release of Information must be obtained prior to any record release in order to collaborate their care with an outside provider. Patient/Guardian was advised if they have  not already done so to contact the registration department to sign all necessary forms in order for [place] to release information regarding their care.   Consent: Patient/Guardian gives verbal consent for treatment and assignment of benefits for services provided during this visit. Patient/Guardian expressed understanding and agreed to proceed.   1. Major depressive disorder, recurrent episode, moderate with anxious distress (HCC)  - traZODone (DESYREL) 50 MG tablet; Take 1 tablet (50 mg total) by mouth at bedtime as needed for sleep.  Dispense: 30 tablet; Refill: 2 - buPROPion (WELLBUTRIN XL) 150 MG 24 hr tablet; Take 1 tablet (150 mg total) by mouth daily.  Dispense: 30 tablet; Refill: 2  2. Sleep disturbances  - traZODone (DESYREL) 50 MG tablet; Take 1 tablet (50 mg total) by mouth at bedtime as needed for sleep.  Dispense: 30 tablet; Refill: 2  3. Attention and concentration deficit  - atomoxetine (STRATTERA) 40 MG capsule; Take 1 capsule (40 mg total) by mouth daily.  Dispense: 30 capsule; Refill: 2  4. Anxiety state  - hydrOXYzine (ATARAX) 10 MG tablet; Take 1 tablet (10 mg total) by mouth 3 (three) times daily as needed.  Dispense: 75 tablet; Refill: 1  Patient to follow up in 2 months Provider spent a total of 22 minutes with the patient/reviewing patient's chart  Meta Hatchet, PA 05/08/2023, 8:20 PM

## 2023-06-03 ENCOUNTER — Ambulatory Visit (INDEPENDENT_AMBULATORY_CARE_PROVIDER_SITE_OTHER): Payer: No Payment, Other | Admitting: Clinical

## 2023-06-03 DIAGNOSIS — F331 Major depressive disorder, recurrent, moderate: Secondary | ICD-10-CM | POA: Diagnosis not present

## 2023-06-03 NOTE — Progress Notes (Signed)
 THERAPIST PROGRESS NOTE Virtual Visit via Video Note  I connected with Krystal Glass on 06/03/2023 at 10:00 AM EDT by a video enabled telemedicine application and verified that I am speaking with the correct person using two identifiers.  Location: Patient: home Provider: office   I discussed the limitations of evaluation and management by telemedicine and the availability of in person appointments. The patient expressed understanding and agreed to proceed.   Follow Up Instructions: I discussed the assessment and treatment plan with the patient. The patient was provided an opportunity to ask questions and all were answered. The patient agreed with the plan and demonstrated an understanding of the instructions.   The patient was advised to call back or seek an in-person evaluation if the symptoms worsen or if the condition fails to improve as anticipated.    Session Time: 60 minutes  Participation Level: Active  Behavioral Response: CasualAlertAnxious  Type of Therapy: Individual Therapy  Treatment Goals addressed:  Patient will participate in at least 80% of scheduled individual psychotherapy sessions   ProgressTowards Goals: Progressing  Interventions: CBT and Supportive  Summary:  Krystal Glass is a 33 y.o. female who presents for the scheduled appointment oriented x 5, appropriately dressed, and friendly.  Client denied hallucinations and delusions. Client reported on today she is managing fairly okay.  Client reported she had her medication changed for Lexapro  to Wellbutrin  and hydroxyzine  because she felt like her libido was being affected.  Client reported that hydroxyzine  is proving to make her too drowsy so she may discuss with the therapist of switching back to Lexapro  because the pros outweigh the cons on that medication.  Client reported anxiety has been a major issue for her which she has been trying to figure out a medication to help offset the severity of it.   Client reported she can manage it somewhat but finds that it is worsening is when she bites her nails down to the skin and it is bleeding.  Client reported she still has the limiting fear that while she is in the process of having her visualization papers that she may be deported like some of her friends and associates that she is known of that have had a run in with officers.  Client reported she does not like to think or live in fear that her and her husband have come up with alternative plans if something were to happen.  Client reported the political climate is taking away some of her pleasure of enjoying her every day activities with her family. Evidence of progress towards goal: Client reported medication compliant 7 days a week.  Client reported practicing at least 2 coping skills such as deep breathing and distracting herself to help offset severity of anxiety symptoms.  Suicidal/Homicidal: Nowithout intent/plan  Therapist Response:  Therapist in the appointment asking client how she has been doing since last seen. Therapist engaged with active listening and positive emotional support. Therapist used CBT to ask the client how she is adjusting to her medication regimen and any issues she is having. Therapist used CBT to ask client about the sources of her anxiety and give her time to express herself. Therapist used CBT to normalize the clients thoughts and emotions within reason and discussed guidance for mindfulness to be present in her daily life. Therapist used CBT ask the client to identify her progress with frequency of use with coping skills with continued practice in her daily activity.    Therapist assigned client homework to  practice self-care.   Plan: Return again in 4 weeks.  Diagnosis: Major depressive disorder, recurrent episode, moderate with anxious distress  Collaboration of Care: Patient refused AEB none requested by the client.  Patient/Guardian was advised Release of  Information must be obtained prior to any record release in order to collaborate their care with an outside provider. Patient/Guardian was advised if they have not already done so to contact the registration department to sign all necessary forms in order for us  to release information regarding their care.   Consent: Patient/Guardian gives verbal consent for treatment and assignment of benefits for services provided during this visit. Patient/Guardian expressed understanding and agreed to proceed.   Blane Worthington Y Sherri Mcarthy, LCSW 06/03/2023

## 2023-06-27 ENCOUNTER — Ambulatory Visit: Payer: Self-pay | Admitting: Nurse Practitioner

## 2023-06-30 ENCOUNTER — Other Ambulatory Visit: Payer: Self-pay | Admitting: Nurse Practitioner

## 2023-06-30 DIAGNOSIS — E282 Polycystic ovarian syndrome: Secondary | ICD-10-CM

## 2023-06-30 DIAGNOSIS — Z8632 Personal history of gestational diabetes: Secondary | ICD-10-CM

## 2023-07-10 ENCOUNTER — Telehealth (HOSPITAL_COMMUNITY): Admitting: Physician Assistant

## 2023-07-10 ENCOUNTER — Encounter (HOSPITAL_COMMUNITY): Payer: Self-pay | Admitting: Physician Assistant

## 2023-07-10 DIAGNOSIS — R41840 Attention and concentration deficit: Secondary | ICD-10-CM | POA: Diagnosis not present

## 2023-07-10 DIAGNOSIS — F331 Major depressive disorder, recurrent, moderate: Secondary | ICD-10-CM | POA: Diagnosis not present

## 2023-07-10 DIAGNOSIS — G479 Sleep disorder, unspecified: Secondary | ICD-10-CM | POA: Diagnosis not present

## 2023-07-10 DIAGNOSIS — F411 Generalized anxiety disorder: Secondary | ICD-10-CM | POA: Diagnosis not present

## 2023-07-10 MED ORDER — ESCITALOPRAM OXALATE 10 MG PO TABS: 10.0000 mg | ORAL_TABLET | Freq: Every day | ORAL | 1 refills | Status: DC

## 2023-07-10 MED ORDER — ATOMOXETINE HCL 40 MG PO CAPS: 40.0000 mg | ORAL_CAPSULE | Freq: Every day | ORAL | 2 refills | Status: DC

## 2023-07-10 MED ORDER — TRAZODONE HCL 50 MG PO TABS
50.0000 mg | ORAL_TABLET | Freq: Every evening | ORAL | 2 refills | Status: DC | PRN
Start: 2023-07-10 — End: 2023-08-23

## 2023-07-10 MED ORDER — ESCITALOPRAM OXALATE 5 MG PO TABS: ORAL_TABLET | ORAL | 0 refills | Status: DC

## 2023-07-10 NOTE — Progress Notes (Signed)
 [facility] MD/PA/NP OP Progress Note  Virtual Visit via Video Note  I connected with Krystal Glass on 07/10/23 at 10:30 AM EDT by a video enabled telemedicine application and verified that I am speaking with the correct person using two identifiers.  Location: Patient: Home Provider: Clinic   I discussed the limitations of evaluation and management by telemedicine and the availability of in person appointments. The patient expressed understanding and agreed to proceed.  Follow Up Instructions:  I discussed the assessment and treatment plan with the patient. The patient was provided an opportunity to ask questions and all were answered. The patient agreed with the plan and demonstrated an understanding of the instructions.   The patient was advised to call back or seek an in-person evaluation if the symptoms worsen or if the condition fails to improve as anticipated.  I provided 20 minutes of non-face-to-face time during this encounter.  Gates Kasal, PA    07/10/2023 6:24 PM Krystal Glass  MRN:  [id number]  Chief Complaint:  Chief Complaint  Patient presents with   Follow-up   Medication Management   HPI:   Krystal Glass is a 33 year old female with a past psychiatric history significant for sleep disturbances, major depressive disorder (with anxious distress) and attention/concentration deficit who presents to [facility] via virtual video visit for follow up and medication management.  Patient is currently being managed on the following psychiatric medications:  Trazodone  50 mg at bedtime as needed Hydroxyzine  10 mg twice daily as needed Atomoxetine  40 mg daily Bupropion  (Wellbutrin  XL) 150 mg 24-hour tablet daily  Patient presents to the encounter stating that she stopped taking her hydroxyzine  2-1/2 weeks ago.  She reports that when she would take hydroxyzine , it would make her sluggish as well as make her sleep all day.  She also reports  that being on Wellbutrin  has made her feel less productive.  She describes herself needing to find the will to get up while on Wellbutrin  and states that she has been more irritable.  She reports that she felt more productive and motivated while on Lexapro  and states that she feels like a couch potato since being on Wellbutrin .  Patient endorses depression and rates her depression a 4 out of 10 with 10 being most severe.  Patient endorses depressive episodes 3 days out of the week.  Patient endorses the following depressive symptoms: lack of motivation, irritability, decreased energy, increased ruminating, feelings of sadness, feelings of guilt/worthlessness, and hopelessness.  Patient also endorses anxiety and rates her anxiety as 7 out of 10.  Patient's current stressors include parenthood and the current political landscape.  A PHQ-9 screen was performed with the patient scoring a 16.  A GAD-7 screen was also performed with the patient scoring a 19.  Patient is alert and oriented x 4, calm, cooperative, and fully engaged in conversation during the encounter.  Patient describes her mood as being on edge.  Patient exhibits depressed mood with appropriate affect.  Patient denies suicidal or homicidal ideations.  She further denied auditory or visual hallucinations and does not appear to be responding to internal/external stimuli.  Patient endorses fair sleep and receives on average 5 hours of sleep per night.  Patient endorses good appetite and eats on average 2 meals per day.  Patient denies alcohol consumption, tobacco use, or illicit drug use.  Visit Diagnosis:    ICD-10-CM   1. Major depressive disorder, recurrent episode, moderate with anxious distress (HCC)  F33.1 escitalopram  (  LEXAPRO ) 5 MG tablet    traZODone  (DESYREL ) 50 MG tablet    escitalopram  (LEXAPRO ) 10 MG tablet    2. Attention and concentration deficit  R41.840 atomoxetine  (STRATTERA ) 40 MG capsule    3. Sleep disturbances  G47.9  traZODone  (DESYREL ) 50 MG tablet    4. 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 10/12/2015   Closed displaced fracture of left clavicle 10/12/2015   Closed displaced segmental fracture of shaft of left humerus 10/09/2015   Gestational diabetes    PCOS (polycystic ovarian syndrome)    PCOS (polycystic ovarian syndrome)    Postpartum depression     Past Surgical History:  Procedure Laterality Date   ORIF HUMERUS FRACTURE Left 10/10/2015   Procedure: OPEN REDUCTION INTERNAL FIXATION (ORIF) LEFT HUMERUS AND CLAVICLE FRACTURE;  Surgeon: Hardy Lia, MD;  Location: [facility] OR;  Service: Orthopedics;  Laterality: Left;    Family Psychiatric History:  Patient denies a family history of psychiatric illness.  She reports there are no documents regarding family members having mental health due to parents disbelief around mental health.   Family History:  Family History  Problem Relation Age of Onset   Hypothyroidism Mother     Social History:  Social History   Socioeconomic History   Marital status: Married    Spouse name: Not on file   Number of children: Not on file   Years of education: Not on file   Highest education level: Not on file  Occupational History   Not on file  Tobacco Use   Smoking status: Never   Smokeless tobacco: Never  Vaping Use   Vaping status: Never Used  Substance and Sexual Activity   Alcohol use: Not Currently    Comment: OCC   Drug use: No   Sexual activity: Yes    Birth control/protection: None  Other Topics Concern   Not on file  Social History Narrative   Not on file   Social Drivers of Health   Financial Resource Strain: Not on file   Food Insecurity: No Food Insecurity (10/21/2019)   Hunger Vital Sign    Worried About Running Out of Food in the Last Year: Never true    Ran Out of Food in the Last Year: Never true  Transportation Needs: No Transportation Needs (10/21/2019)   PRAPARE - Administrator, Civil Service (Medical): No    Lack of Transportation (Non-Medical): No  Physical Activity: Not on file  Stress: Not on file  Social Connections: Not on file    Allergies: No Known Allergies  Metabolic Disorder Labs: Lab Results  Component Value Date   HGBA1C 6.0 12/27/2022   MPG 137 11/02/2020   No results found for: "PROLACTIN" Lab Results  Component Value Date   CHOL 222 (H) 11/02/2020   TRIG 168 (H) 11/02/2020   HDL 47 11/02/2020   CHOLHDL 4.7 11/02/2020   VLDL 34 11/02/2020   LDLCALC 141 (H) 11/02/2020   Lab Results  Component Value Date   TSH 0.650 02/20/2022   TSH 0.642 11/02/2020    Therapeutic Level Labs: No results found for: "LITHIUM" No results found for: "VALPROATE" No results found for: "CBMZ"  Current Medications:  Current Outpatient Medications  Medication Sig Dispense Refill   [START ON 08/09/2023] escitalopram  (LEXAPRO ) 10 MG tablet Take 1 tablet (10 mg total) by mouth daily. 30 tablet 1   atomoxetine  (STRATTERA ) 40 MG capsule Take 1 capsule (40 mg total) by mouth daily. 30 capsule 2   buPROPion  (WELLBUTRIN  XL) 150 MG 24 hr tablet Take 1 tablet (150 mg total) by mouth daily. 30 tablet 2   escitalopram  (LEXAPRO ) 5 MG tablet Take 1 tablet (5 mg total) by mouth daily for 6 days, THEN 2 tablets (10 mg total) daily for 24 days. 54 tablet 0   hydrOXYzine  (ATARAX ) 10 MG tablet Take 1 tablet (10 mg total) by mouth 3 (three) times daily as needed. 75 tablet 1   metFORMIN  (GLUCOPHAGE -XR) 500 MG 24 hr tablet TAKE 1 TABLET BY MOUTH DAILY WITH BREAKFAST 30 tablet 0   Multiple Vitamin (MULTIVITAMIN WITH MINERALS) TABS tablet Take 1 tablet by mouth daily.     traZODone  (DESYREL ) 50 MG  tablet Take 1 tablet (50 mg total) by mouth at bedtime as needed for sleep. 30 tablet 2   Vitamin D , Ergocalciferol , (DRISDOL ) 1.25 MG (50000 UNIT) CAPS capsule Take 1 capsule (50,000 Units total) by mouth every 7 (seven) days. TAKE ONCE A WEEK 12 capsule 0   No current facility-administered medications for this visit.     Musculoskeletal: Strength & Muscle Tone: within normal limits Gait & Station: normal Patient leans: N/A  Psychiatric Specialty Exam: Review of Systems  Psychiatric/Behavioral:  Positive for dysphoric mood and sleep disturbance. Negative for decreased concentration, hallucinations, self-injury and suicidal ideas. The patient is nervous/anxious. The patient is not hyperactive.     There were no vitals taken for this visit.There is no height or weight on file to calculate BMI.  General Appearance: Casual  Eye Contact:  Good  Speech:  Clear and Coherent and Normal Rate  Volume:  Normal  Mood:  Anxious and Depressed  Affect:  Congruent  Thought Process:  Coherent, Goal Directed, and Descriptions of Associations: Intact  Orientation:  Full (Time, Place, and Person)  Thought Content: WDL   Suicidal Thoughts:  No  Homicidal Thoughts:  No  Memory:  Immediate;   Good Recent;   Good Remote;   Good  Judgement:  Good  Insight:  Good  Psychomotor Activity:  Normal  Concentration:  Concentration: Good and Attention Span: Good  Recall:  Good  Fund of Knowledge: Good  Language: Good  Akathisia:  No  Handed:  Right  AIMS (if indicated): not done  Assets:  Communication Skills Desire for Improvement Financial Resources/Insurance Housing Social Support Transportation  ADL's:  Intact  Cognition: WNL  Sleep:  Fair   Screenings: GAD-7    Flowsheet Row Video Visit from 07/10/2023 in [facility] Video Visit from 05/08/2023 in [facility] Video Visit from 03/27/2023 in [facility] Video  Visit from 01/23/2023 in [facility] Office Visit from 12/27/2022 in [facility] [place]  Total GAD-7 Score 19 17 15 12 16       PHQ2-9    Flowsheet Row Video Visit from 07/10/2023 in [facility] Video Visit from 05/08/2023 in [facility] Video Visit from 03/27/2023 in [facility] Video Visit from 01/23/2023 in [facility] Office Visit from 12/27/2022 in [facility]  [facility] [place]. [facility]  PHQ-2 Total Score 3 3 3 2 3   PHQ-9 Total Score 16 16 13 14 11       Flowsheet Row Video Visit from 07/10/2023 in [facility] Video Visit from 05/08/2023 in [facility] Video Visit from 03/27/2023 in [facility]  C-SSRS RISK CATEGORY Moderate Risk Moderate Risk Moderate Risk        Assessment and Plan:   Geniya Fulgham is a 33 year old female with a past psychiatric history significant for sleep disturbances, major depressive disorder (with anxious distress) and attention/concentration deficit who presents to [facility] via virtual video visit for follow up and medication management.  Patient presents to the encounter stating that she has discontinued taking her hydroxyzine  due to the side effects she experienced while on the medication.  She reports that while taking hydroxyzine , she would experience sluggishness and sleeping all day.  Patient also reports that her use of Wellbutrin  has been ineffective and managing her mood and productivity.  Since being on the medication, patient reports that her productivity have lessened and that she feels more irritable.  Patient continues to experience ongoing depression and anxiety attributed to her medications  being an effective and due to stressors in her life.  Patient would like to to discontinue Wellbutrin  stating that her use of Lexapro  was more helpful in managing her depression and anxiety while promoting productivity.  Patient was instructed to take Wellbutrin  for 6 days every other day followed by every 2 days for 9 days before discontinuing the medication.  Patient vocalized understanding.  Patient to be placed back on Lexapro  5 mg for 6 days, followed by 10 mg daily for the management of her depressive symptoms and anxiety.  Patient was agreeable to recommendations.  Patient to continue taking her other medications as prescribed.  Patient's medications to be e-prescribed to pharmacy of choice.  A [place] Suicide Severity Rating Scale was performed with the patient being considered moderate risk.  Patient denies suicidal ideations and is able to contract for safety following the conclusion of the encounter.  Collaboration of Care: Collaboration of Care: Medication Management AEB Provider managing patient's psychiatric medications, Primary Care Provider AEB patient being seen by internal medicine, Psychiatrist AEB patient being followed by mental health provider at this facility, and Referral or follow-up with counselor/therapist AEB patient being seen by licensed clinical social worker at this facility  Patient/Guardian was advised Release of Information must be obtained prior to any record release in order to collaborate their care with an outside provider. Patient/Guardian was advised if they have not already done so to contact the registration department to sign all necessary forms in order for us  to release information regarding their care.   Consent: Patient/Guardian gives verbal consent for treatment and assignment of benefits for services provided during this visit. Patient/Guardian expressed understanding and agreed to proceed.   1. Major depressive disorder, recurrent episode, moderate with  anxious distress (HCC)  - escitalopram  (LEXAPRO ) 5 MG tablet; Take 1 tablet (5 mg total) by mouth daily for 6 days, THEN 2 tablets (10 mg total) daily for 24 days.  Dispense: 54 tablet; Refill: 0 - traZODone  (DESYREL ) 50 MG tablet; Take 1 tablet (50 mg total) by mouth at bedtime as needed for sleep.  Dispense: 30 tablet; Refill: 2 - escitalopram  (LEXAPRO ) 10 MG tablet; Take 1 tablet (10 mg total) by mouth daily.  Dispense: 30  tablet; Refill: 1  2. Attention and concentration deficit  - atomoxetine  (STRATTERA ) 40 MG capsule; Take 1 capsule (40 mg total) by mouth daily.  Dispense: 30 capsule; Refill: 2  3. Sleep disturbances  - traZODone  (DESYREL ) 50 MG tablet; Take 1 tablet (50 mg total) by mouth at bedtime as needed for sleep.  Dispense: 30 tablet; Refill: 2  4. Anxiety state  - escitalopram  (LEXAPRO ) 5 MG tablet; Take 1 tablet (5 mg total) by mouth daily for 6 days, THEN 2 tablets (10 mg total) daily for 24 days.  Dispense: 54 tablet; Refill: 0 - escitalopram  (LEXAPRO ) 10 MG tablet; Take 1 tablet (10 mg total) by mouth daily.  Dispense: 30 tablet; Refill: 1  Patient to follow up in 6 weeks Provider spent a total of 20 minutes with the patient/reviewing patient's chart  Gates Kasal, PA 07/10/2023, 6:24 PM

## 2023-07-24 ENCOUNTER — Ambulatory Visit (INDEPENDENT_AMBULATORY_CARE_PROVIDER_SITE_OTHER): Admitting: Clinical

## 2023-07-24 DIAGNOSIS — F331 Major depressive disorder, recurrent, moderate: Secondary | ICD-10-CM | POA: Diagnosis not present

## 2023-07-25 ENCOUNTER — Encounter: Payer: Self-pay | Admitting: Nurse Practitioner

## 2023-07-25 ENCOUNTER — Ambulatory Visit: Payer: Self-pay | Attending: Nurse Practitioner | Admitting: Nurse Practitioner

## 2023-07-25 VITALS — BP 136/81 | HR 76 | Resp 19 | Ht 64.0 in | Wt 242.0 lb

## 2023-07-25 DIAGNOSIS — Z6841 Body Mass Index (BMI) 40.0 and over, adult: Secondary | ICD-10-CM

## 2023-07-25 DIAGNOSIS — R7303 Prediabetes: Secondary | ICD-10-CM

## 2023-07-25 NOTE — Progress Notes (Signed)
 Assessment & Plan:  Krystal Glass was seen today for anxiety and obesity.  Diagnoses and all orders for this visit:  Prediabetes -     Hemoglobin A1c -     Basic metabolic panel with GFR  Morbid obesity with BMI of 40.0-44.9, adult (HCC) -     Basic metabolic panel with GFR -     Thyroid  Panel With TSH Patient was urged to apply for the financial assistance program.  They were instructed to inquire at the front desk about the application process for the [place] discount, orange card or other financial assistance.     REFER TO [facility]IC CENTER IF APPROVED FOR [employer]   Patient has been counseled on age-appropriate routine health concerns for screening and prevention. These are reviewed and up-to-date. Referrals have been placed accordingly. Immunizations are up-to-date or declined.    Subjective:   Chief Complaint  Patient presents with   Anxiety   Obesity    Krystal Glass 33 y.o. female presents to office today with concerns of weight gain.   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   She has gained almost 20 lbs since her last visit in November. Despite working out 6 days a week she has not been able to lose any weight and has actually gained. Activities include [place] folklore dancing, strength training and cardio. She started exercising in January. Follows a semi diabetic diet that she was introduced to when she was pregnany years ago with gestational diabetes.  She does not have insurance coverage or currently any diabetes diagnosis so we are not able to prescribe her any GLP-1's today.  She cannot afford the out-of-pocket cost for cash pay.  Would not advise phentermine as she is currently on Strattera .   Recently started  back on Lexapro .  Prior to restarting Lexapro  she had been on Wellbutrin  150 mg daily.  She did not feel this was effective and felt Lexapro  had worked better for her in the past that she was restarted on SSRI.  Hydroxyzine  caused excessive sedation or drowsiness some of this was stopped.    BP Readings from Last 3 Encounters:  07/25/23 136/81  12/27/22 115/78  06/25/22 119/74     Lab Results  Component Value Date   HGBA1C 6.0 12/27/2022    Review of Systems  Constitutional:  Negative for fever, malaise/fatigue and weight loss.  HENT: Negative.  Negative for nosebleeds.   Eyes: Negative.  Negative for blurred vision, double vision and photophobia.  Respiratory: Negative.  Negative for cough and shortness of breath.   Cardiovascular: Negative.  Negative for chest pain, palpitations and leg swelling.  Gastrointestinal: Negative.  Negative for heartburn, nausea and vomiting.  Musculoskeletal: Negative.  Negative for myalgias.  Neurological: Negative.  Negative for dizziness, focal weakness, seizures and headaches.  Psychiatric/Behavioral: Negative.  Negative for suicidal ideas.     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 10/12/2015   Anxiety    Closed displaced fracture of left clavicle 10/12/2015   Closed displaced segmental fracture of shaft of left humerus 10/09/2015   Gestational diabetes    PCOS (polycystic ovarian syndrome)    PCOS (polycystic ovarian syndrome)    Postpartum depression     Past Surgical History:  Procedure  Laterality Date   FRACTURE SURGERY     ORIF HUMERUS FRACTURE Left 10/10/2015   Procedure: OPEN REDUCTION INTERNAL FIXATION (ORIF) LEFT HUMERUS AND CLAVICLE FRACTURE;  Surgeon: Hardy Lia, MD;  Location: [facility] OR;  Service: Orthopedics;  Laterality: Left;    Family History  Problem Relation Age of Onset   Hypothyroidism Mother    Alcohol abuse Father    Anxiety disorder Father    Diabetes Father     Miscarriages / Stillbirths Maternal Aunt    Varicose Veins Maternal Aunt     Social History Reviewed with no changes to be made today.   Outpatient Medications Prior to Visit  Medication Sig Dispense Refill   atomoxetine  (STRATTERA ) 40 MG capsule Take 1 capsule (40 mg total) by mouth daily. 30 capsule 2   [START ON 08/09/2023] escitalopram  (LEXAPRO ) 10 MG tablet Take 1 tablet (10 mg total) by mouth daily. 30 tablet 1   escitalopram  (LEXAPRO ) 5 MG tablet Take 1 tablet (5 mg total) by mouth daily for 6 days, THEN 2 tablets (10 mg total) daily for 24 days. 54 tablet 0   metFORMIN  (GLUCOPHAGE -XR) 500 MG 24 hr tablet TAKE 1 TABLET BY MOUTH DAILY WITH BREAKFAST 30 tablet 0   Multiple Vitamin (MULTIVITAMIN WITH MINERALS) TABS tablet Take 1 tablet by mouth daily.     traZODone  (DESYREL ) 50 MG tablet Take 1 tablet (50 mg total) by mouth at bedtime as needed for sleep. 30 tablet 2   hydrOXYzine  (ATARAX ) 10 MG tablet Take 1 tablet (10 mg total) by mouth 3 (three) times daily as needed. (Patient not taking: Reported on 07/25/2023) 75 tablet 1   Vitamin D , Ergocalciferol , (DRISDOL ) 1.25 MG (50000 UNIT) CAPS capsule Take 1 capsule (50,000 Units total) by mouth every 7 (seven) days. TAKE ONCE A WEEK (Patient not taking: Reported on 07/25/2023) 12 capsule 0   buPROPion  (WELLBUTRIN  XL) 150 MG 24 hr tablet Take 1 tablet (150 mg total) by mouth daily. (Patient not taking: Reported on 07/25/2023) 30 tablet 2   No facility-administered medications prior to visit.    No Known Allergies     Objective:    BP 136/81 (BP Location: Left Arm, Patient Position: Sitting, Cuff Size: Normal)   Pulse 76   Resp 19   Ht 5' 4 (1.626 m)   Wt 242 lb (109.8 kg)   LMP 07/11/2023 (Exact Date)   SpO2 98%   BMI 41.54 kg/m  Wt Readings from Last 3 Encounters:  07/25/23 242 lb (109.8 kg)  12/27/22 227 lb 6.4 oz (103.1 kg)  06/25/22 226 lb (102.5 kg)    Physical Exam Vitals and nursing note reviewed.  Constitutional:       Appearance: She is well-developed.  HENT:     Head: Normocephalic and atraumatic.   Cardiovascular:     Rate and Rhythm: Normal rate and regular rhythm.     Heart sounds: Normal heart sounds. No murmur heard.    No friction rub. No gallop.  Pulmonary:     Effort: Pulmonary effort is normal. No tachypnea or respiratory distress.     Breath sounds: Normal breath sounds. No decreased breath sounds, wheezing, rhonchi or rales.  Chest:     Chest wall: No tenderness.  Abdominal:     General: Bowel sounds are normal.     Palpations: Abdomen is soft.   Musculoskeletal:        General: Normal range of motion.     Cervical back: Normal range of motion.  Skin:    General: Skin is warm and dry.   Neurological:     Mental Status: She is alert and oriented to person, place, and time.     Coordination: Coordination normal.   Psychiatric:        Behavior: Behavior normal. Behavior is cooperative.        Thought Content: Thought content normal.        Judgment: Judgment normal.          Patient has been counseled extensively about nutrition and exercise as well as the importance of adherence with medications and regular follow-up. The patient was given clear instructions to go to ER or return to medical center if symptoms don't improve, worsen or new problems develop. The patient verbalized understanding.   Follow-up: Return in about 6 months (around 01/24/2024).   Collins Dean, FNP-BC [facility] [place], [place] [phone]   07/25/2023, 11:49 AM

## 2023-07-26 LAB — BASIC METABOLIC PANEL WITH GFR
BUN/Creatinine Ratio: 18 (ref 9–23)
BUN: 11 mg/dL (ref 6–20)
CO2: 24 mmol/L (ref 20–29)
Calcium: 9.6 mg/dL (ref 8.7–10.2)
Chloride: 101 mmol/L (ref 96–106)
Creatinine, Ser: 0.6 mg/dL (ref 0.57–1.00)
Glucose: 122 mg/dL — ABNORMAL HIGH (ref 70–99)
Potassium: 4.8 mmol/L (ref 3.5–5.2)
Sodium: 138 mmol/L (ref 134–144)
eGFR: 124 mL/min/{1.73_m2} (ref >59)

## 2023-07-26 LAB — THYROID PANEL WITH TSH
Free Thyroxine Index: 2.5 (ref 1.2–4.9)
T3 Uptake Ratio: 30 % (ref 24–39)
T4, Total: 8.2 ug/dL (ref 4.5–12.0)
TSH: 0.993 u[IU]/mL (ref 0.450–4.500)

## 2023-07-26 LAB — HEMOGLOBIN A1C
Est. average glucose Bld gHb Est-mCnc: 128 mg/dL
Hgb A1c MFr Bld: 6.1 % — ABNORMAL HIGH (ref 4.8–5.6)

## 2023-07-26 NOTE — Progress Notes (Signed)
   THERAPIST PROGRESS NOTE Virtual Visit via Video Note  I connected with Krystal Glass on 07/24/2023 at 11:00 AM EDT by a video enabled telemedicine application and verified that I am speaking with the correct person using two identifiers.  Location: Patient: home Provider: office   I discussed the limitations of evaluation and management by telemedicine and the availability of in person appointments. The patient expressed understanding and agreed to proceed.  Follow Up Instructions: I discussed the assessment and treatment plan with the patient. The patient was provided an opportunity to ask questions and all were answered. The patient agreed with the plan and demonstrated an understanding of the instructions.   The patient was advised to call back or seek an in-person evaluation if the symptoms worsen or if the condition fails to improve as anticipated.   Session Time: 40 minutes  Participation Level: Active  Behavioral Response: CasualAlertAnxious  Type of Therapy: Individual Therapy  Treatment Goals addressed: Patient will reduce frequency of avoidant behaviors by 50% as evidenced by self-report in therapy sessions   ProgressTowards Goals: Progressing  Interventions: CBT and Supportive  Summary:  Krystal Glass is a 33 y.o. female who presents for scheduled appointment oriented x 5, appropriately dressed, and friendly.  Client denied hallucinations and delusions. Client reported on today she has been doing fairly okay but still experiencing stress.  Client reported the end of the school year with the kids is always busy but they both finished on time.  Client reported the talks continue of planning for the future in any event.  Client reported she finds that she is very different from her parents ideas.  Client reported there is still the dangling anxiety about the ice deportation's. Evidence of progress towards goal: Client reported 1 positive of being grounded in finding  things in her daily life to keep her in a positive mindset.  Suicidal/Homicidal: Nowithout intent/plan  Therapist Response:  Therapist began the appointment asking the client how she has been doing since last seen. Therapist engaged with active listening and positive emotional support. Therapist used CBT to engage and asked the client how she has been handling daily stressors. Therapist used CBT to give the client time to openly discuss her thoughts about life in general and her relationship with her family. Therapist used CBT to normalize her concerns. Therapist used CBT ask the client to identify her progress with frequency of use with coping skills with continued practice in her daily activity.    Therapist assigned the client homework to practice self-care.   Plan: Return again in 4 weeks.  Diagnosis: Major depressive disorder, recurrent episode, moderate with anxious distress  Collaboration of Care: Patient refused AEB none requested by the client.  Patient/Guardian was advised Release of Information must be obtained prior to any record release in order to collaborate their care with an outside provider. Patient/Guardian was advised if they have not already done so to contact the registration department to sign all necessary forms in order for us  to release information regarding their care.   Consent: Patient/Guardian gives verbal consent for treatment and assignment of benefits for services provided during this visit. Patient/Guardian expressed understanding and agreed to proceed.   Amalya Salmons Y Quinette Hentges, LCSW 07/24/2023

## 2023-07-28 ENCOUNTER — Ambulatory Visit: Payer: Self-pay | Admitting: Nurse Practitioner

## 2023-08-11 ENCOUNTER — Other Ambulatory Visit (HOSPITAL_COMMUNITY): Payer: Self-pay | Admitting: Physician Assistant

## 2023-08-11 DIAGNOSIS — F331 Major depressive disorder, recurrent, moderate: Secondary | ICD-10-CM

## 2023-08-11 DIAGNOSIS — F411 Generalized anxiety disorder: Secondary | ICD-10-CM

## 2023-08-21 ENCOUNTER — Telehealth (HOSPITAL_COMMUNITY): Admitting: Physician Assistant

## 2023-08-21 ENCOUNTER — Ambulatory Visit (HOSPITAL_COMMUNITY): Admitting: Clinical

## 2023-08-21 ENCOUNTER — Encounter (HOSPITAL_COMMUNITY): Payer: Self-pay

## 2023-08-21 DIAGNOSIS — R41840 Attention and concentration deficit: Secondary | ICD-10-CM | POA: Diagnosis not present

## 2023-08-21 DIAGNOSIS — G479 Sleep disorder, unspecified: Secondary | ICD-10-CM | POA: Diagnosis not present

## 2023-08-21 DIAGNOSIS — F411 Generalized anxiety disorder: Secondary | ICD-10-CM | POA: Diagnosis not present

## 2023-08-21 DIAGNOSIS — F331 Major depressive disorder, recurrent, moderate: Secondary | ICD-10-CM | POA: Diagnosis not present

## 2023-08-23 ENCOUNTER — Encounter (HOSPITAL_COMMUNITY): Payer: Self-pay | Admitting: Physician Assistant

## 2023-08-23 MED ORDER — ESCITALOPRAM OXALATE 20 MG PO TABS: 20.0000 mg | ORAL_TABLET | Freq: Every day | ORAL | 1 refills | Status: DC

## 2023-08-23 MED ORDER — TRAZODONE HCL 50 MG PO TABS
50.0000 mg | ORAL_TABLET | Freq: Every evening | ORAL | 1 refills | Status: DC | PRN
Start: 2023-08-23 — End: 2023-10-05

## 2023-08-23 MED ORDER — ATOMOXETINE HCL 40 MG PO CAPS: 40.0000 mg | ORAL_CAPSULE | Freq: Every day | ORAL | 2 refills | Status: DC

## 2023-08-23 NOTE — Progress Notes (Signed)
 [facility] MD/PA/NP OP Progress Note  Virtual Visit via Video Note  I connected with Krystal Glass on 08/21/23 at 11:30 AM EDT by a video enabled telemedicine application and verified that I am speaking with the correct person using two identifiers.  Location: Patient: Home Provider: Clinic   I discussed the limitations of evaluation and management by telemedicine and the availability of in person appointments. The patient expressed understanding and agreed to proceed.  Follow Up Instructions:  I discussed the assessment and treatment plan with the patient. The patient was provided an opportunity to ask questions and all were answered. The patient agreed with the plan and demonstrated an understanding of the instructions.   The patient was advised to call back or seek an in-person evaluation if the symptoms worsen or if the condition fails to improve as anticipated.  I provided 23 minutes of non-face-to-face time during this encounter.  Krystal FORBES Bolster, PA    08/21/2023 11:30 AM Krystal Glass  MRN:  [id number]  Chief Complaint:  Chief Complaint  Patient presents with   Follow-up   Medication Management   HPI:   Krystal Glass is a 33 year old female with a past psychiatric history significant for sleep disturbances, major depressive disorder (with anxious distress) and attention/concentration deficit who presents to [facility] via virtual video visit for follow up and medication management.  Patient is currently being managed on the following psychiatric medications:  Trazodone  50 mg at bedtime as needed Atomoxetine  40 mg daily Lexapro  10 mg daily  Since taking Lexapro , patient reports that she feels more motivated than when she was on Wellbutrin .  Despite some stressors in her life, patient reports that she is doing pretty good overall.  She does report that her intrusive thoughts have been high and is unsure if these thoughts are due to use of  her Lexapro  or stressors.  Patient reports that she is constantly pushing away her negative thoughts.  Patient endorses suicidal ideations on occasion when by herself or when overwhelmed; however, patient reports that she is able to push her thoughts away.  Patient notes that she has also struggled greatly to take her medications regularly.  She reports that she often procrastinates when taking her medications by doing other activities.  She describes taking her medications as a huge chore.  Patient endorses depression and rates her depression a 5 or 6 out of 10 with 10 being most severe.  Patient endorses depressive episodes 2 to 3 days out of the week.  Patient endorses the following depressive symptoms: feelings of sadness, lack of motivation, decreased concentration, decreased energy, feelings of guilt/worthlessness, and hopelessness.  She also endorses anxiety and rates her anxiety a 6 out of 10.  Patient's current stressors include political issues revolving around immigration reform, her son being out of school, and her children's behavior.  Patient states that when taking care of her children, their yelling makes her feel more irritable.  A PHQ-9 screen was performed with the patient scoring a 21.  A GAD-7 screen was also performed with the patient scoring an 18.  Patient is alert and oriented x 4, calm, cooperative, and fully engaged in conversation during the encounter.  Patient endorses irritable mood.  Patient exhibits depressed mood with appropriate affect.  Patient denies suicidal or homicidal ideations.  She further denies auditory or visual hallucinations and does not appear to be responding to internal/external stimuli.  Patient endorses fair sleep and receives on average 6 to 7 hours  of sleep per night.  Patient endorses good appetite and eats on average 2 meals per day.  Patient states that she often binge eats.  Patient denies alcohol consumption, tobacco use, or illicit drug use.  Visit  Diagnosis:    ICD-10-CM   1. Major depressive disorder, recurrent episode, moderate with anxious distress (HCC)  F33.1 escitalopram  (LEXAPRO ) 20 MG tablet    traZODone  (DESYREL ) 50 MG tablet    2. Anxiety state  F41.1 escitalopram  (LEXAPRO ) 20 MG tablet    3. Attention and concentration deficit  R41.840 atomoxetine  (STRATTERA ) 40 MG capsule    4. Sleep disturbances  G47.9 traZODone  (DESYREL ) 50 MG tablet      Past Psychiatric History:  Major depressive disorder with anxious distress Sleep disturbances Attention and concentration deficit  Past Medical History:  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 10/12/2015   Anxiety    Closed displaced fracture of left clavicle 10/12/2015   Closed displaced segmental fracture of shaft of left humerus 10/09/2015   Gestational diabetes    PCOS (polycystic ovarian syndrome)    PCOS (polycystic ovarian syndrome)    Postpartum depression     Past Surgical History:  Procedure Laterality Date   FRACTURE SURGERY     ORIF HUMERUS FRACTURE Left 10/10/2015   Procedure: OPEN REDUCTION INTERNAL FIXATION (ORIF) LEFT HUMERUS AND CLAVICLE FRACTURE;  Surgeon: Ozell Bruch, MD;  Location: [facility] OR;  Service: Orthopedics;  Laterality: Left;    Family Psychiatric History:  Patient denies a family history of psychiatric illness.  She reports there are no documents regarding family members having mental health due to parents disbelief around mental health.   Family History:  Family History  Problem Relation Age of Onset   Hypothyroidism Mother    Alcohol abuse Father    Anxiety disorder Father    Diabetes Father    Miscarriages / Stillbirths Maternal Aunt    Varicose Veins Maternal Aunt     Social History:  Social History   Socioeconomic History   Marital status: Married    Spouse name: Not on file   Number of children: Not on file   Years of education: Not on file   Highest education level: Not  on file  Occupational History   Not on file  Tobacco Use   Smoking status: Never   Smokeless tobacco: Never  Vaping Use   Vaping status: Never Used  Substance and Sexual Activity   Alcohol use: Not Currently    Comment: OCC   Drug use: No   Sexual activity: Yes    Birth control/protection: None  Other Topics Concern   Not on file  Social History Narrative   Not on file   Social Drivers of Health   Financial Resource Strain: Medium Risk (07/25/2023)   Overall Financial Resource Strain (CARDIA)    Difficulty of Paying Living Expenses: Somewhat hard  Food Insecurity: No Food Insecurity (07/25/2023)   Hunger Vital Sign    Worried About Running Out of Food in the Last Year: Never true    Ran Out of Food in the Last Year: Never true  Transportation Needs: No Transportation Needs (07/25/2023)   PRAPARE - Administrator, Civil Service (Medical): No    Lack of Transportation (Non-Medical): No  Physical Activity: Sufficiently Active (07/25/2023)   Exercise Vital Sign    Days of Exercise per Week: 7 days    Minutes of Exercise per Session: 60  min  Stress: No Stress Concern Present (07/25/2023)   [employer] of Occupational Health - Occupational Stress Questionnaire    Feeling of Stress: Not at all  Social Connections: Moderately Isolated (07/25/2023)   Social Connection and Isolation Panel    Frequency of Communication with Friends and Family: More than three times a week    Frequency of Social Gatherings with Friends and Family: More than three times a week    Attends Religious Services: Never    Database administrator or Organizations: No    Attends Engineer, structural: Never    Marital Status: Married    Allergies: No Known Allergies  Metabolic Disorder Labs: Lab Results  Component Value Date   HGBA1C 6.1 (H) 07/25/2023   MPG 137 11/02/2020   No results found for: PROLACTIN Lab Results  Component Value Date   CHOL 222 (H) 11/02/2020   TRIG  168 (H) 11/02/2020   HDL 47 11/02/2020   CHOLHDL 4.7 11/02/2020   VLDL 34 11/02/2020   LDLCALC 141 (H) 11/02/2020   Lab Results  Component Value Date   TSH 0.993 07/25/2023   TSH 0.650 02/20/2022    Therapeutic Level Labs: No results found for: LITHIUM No results found for: VALPROATE No results found for: CBMZ  Current Medications: Current Outpatient Medications  Medication Sig Dispense Refill   atomoxetine  (STRATTERA ) 40 MG capsule Take 1 capsule (40 mg total) by mouth daily. 30 capsule 2   escitalopram  (LEXAPRO ) 20 MG tablet Take 1 tablet (20 mg total) by mouth daily. 30 tablet 1   hydrOXYzine  (ATARAX ) 10 MG tablet Take 1 tablet (10 mg total) by mouth 3 (three) times daily as needed. (Patient not taking: Reported on 07/25/2023) 75 tablet 1   metFORMIN  (GLUCOPHAGE -XR) 500 MG 24 hr tablet TAKE 1 TABLET BY MOUTH DAILY WITH BREAKFAST 30 tablet 0   Multiple Vitamin (MULTIVITAMIN WITH MINERALS) TABS tablet Take 1 tablet by mouth daily.     traZODone  (DESYREL ) 50 MG tablet Take 1 tablet (50 mg total) by mouth at bedtime as needed for sleep. 30 tablet 1   Vitamin D , Ergocalciferol , (DRISDOL ) 1.25 MG (50000 UNIT) CAPS capsule Take 1 capsule (50,000 Units total) by mouth every 7 (seven) days. TAKE ONCE A WEEK (Patient not taking: Reported on 07/25/2023) 12 capsule 0   No current facility-administered medications for this visit.     Musculoskeletal: Strength & Muscle Tone: within normal limits Gait & Station: normal Patient leans: N/A  Psychiatric Specialty Exam: Review of Systems  Psychiatric/Behavioral:  Positive for dysphoric mood and sleep disturbance. Negative for decreased concentration, hallucinations, self-injury and suicidal ideas. The patient is nervous/anxious. The patient is not hyperactive.     Last menstrual period 07/11/2023.There is no height or weight on file to calculate BMI.  General Appearance: Casual  Eye Contact:  Good  Speech:  Clear and Coherent and  Normal Rate  Volume:  Normal  Mood:  Anxious and Depressed  Affect:  Congruent  Thought Process:  Coherent, Goal Directed, and Descriptions of Associations: Intact  Orientation:  Full (Time, Place, and Person)  Thought Content: WDL   Suicidal Thoughts:  No  Homicidal Thoughts:  No  Memory:  Immediate;   Good Recent;   Good Remote;   Good  Judgement:  Good  Insight:  Good  Psychomotor Activity:  Normal  Concentration:  Concentration: Good and Attention Span: Good  Recall:  Good  Fund of Knowledge: Good  Language: Good  Akathisia:  No  Handed:  Right  AIMS (if indicated): not done  Assets:  Communication Skills Desire for Improvement Financial Resources/Insurance Housing Social Support Transportation  ADL's:  Intact  Cognition: WNL  Sleep:  Fair   Screenings: GAD-7    Flowsheet Row Video Visit from 08/21/2023 in [facility] Office Visit from 07/25/2023 in [facility] [place] [facility] [place]. [facility] Video Visit from 07/10/2023 in [facility] Video Visit from 05/08/2023 in [facility] Video Visit from 03/27/2023 in [facility]  Total GAD-7 Score 18 19 19 17 15    PHQ2-9    Flowsheet Row Video Visit from 08/21/2023 in [facility] Office Visit from 07/25/2023 in [facility] [place] [facility] [place] Video Visit from 07/10/2023 in [facility] Video Visit from 05/08/2023 in [facility] Video Visit from 03/27/2023 in [facility]  PHQ-2 Total Score 5 2 3 3 3   PHQ-9 Total Score 21 14 16 16 13    Flowsheet Row Video Visit from 08/21/2023 in [facility] Video Visit from 07/10/2023 in [facility] Video Visit from 05/08/2023 in [facility]  C-SSRS RISK CATEGORY Moderate Risk Moderate Risk Moderate Risk     Assessment and Plan:   Najae Filsaime is a 33 year old female with a past psychiatric history significant for sleep disturbances, major depressive disorder (with anxious distress) and attention/concentration deficit who presents to [facility] via virtual video visit for follow up and medication management.  Patient presents to the encounter stating that she occasionally struggles to take her medications describing the active taking her medications as a huge chore.  Patient states that she occasionally finds herself procrastinating from taking her medications by doing other activities.  When she does remember to take her medications, patient reports that they appear effective in managing her symptoms.  She does report that she occasionally experiences intrusive thoughts that she often has to push away.  Patient is unsure if her intrusive thoughts are due to the use of her Lexapro  or to stressors in her life.  Patient occasionally endorses suicidal ideations but states that she is able to push these thoughts away.  Patient's depression and anxiety appear to be attributed to stressors in her life.  A PHQ-9 screen was performed with the patient scoring a 21.  A GAD-7 screen was also performed with the patient scoring an 18.  Though it is uncertain if patient's intrusive thoughts are attributed to Lexapro , provider recommended increasing patient's Lexapro  dosage from 10 mg to 20 mg daily for the management of her depression and anxiety.  Though patient was hesitant, patient agreed to increasing the medication to 20 mg daily.  Provider discussed potential side effects associated with her Lexapro  prior to the conclusion of the encounter.  Patient to continue taking all other medications as prescribed.  Patient's medications to be e-prescribed to pharmacy of choice.  A [place]  Suicide Severity Rating Scale was performed with the patient being considered moderate risk.  Patient denies suicidal ideations and is able to contract for safety at this time.  Safety planning was discussed with the patient prior to the conclusion of the encounter.  - Patient was instructed to contact 911 in the event of a mental health crisis. - Patient  was instructed to contact 988 Suicide and Crisis Lifeline in the event of a mental health crisis. - Patient was instructed to present to [facility] in the event of a mental health crisis.  Collaboration of Care: Collaboration of Care: Medication Management AEB Provider managing patient's psychiatric medications, Primary Care Provider AEB patient being seen by internal medicine, Psychiatrist AEB patient being followed by mental health provider at this facility, and Referral or follow-up with counselor/therapist AEB patient being seen by licensed clinical social worker at this facility  Patient/Guardian was advised Release of Information must be obtained prior to any record release in order to collaborate their care with an outside provider. Patient/Guardian was advised if they have not already done so to contact the registration department to sign all necessary forms in order for us  to release information regarding their care.   Consent: Patient/Guardian gives verbal consent for treatment and assignment of benefits for services provided during this visit. Patient/Guardian expressed understanding and agreed to proceed.   1. Major depressive disorder, recurrent episode, moderate with anxious distress (HCC)  - escitalopram  (LEXAPRO ) 20 MG tablet; Take 1 tablet (20 mg total) by mouth daily.  Dispense: 30 tablet; Refill: 1 - traZODone  (DESYREL ) 50 MG tablet; Take 1 tablet (50 mg total) by mouth at bedtime as needed for sleep.  Dispense: 30 tablet; Refill: 1  2. Anxiety state  - escitalopram  (LEXAPRO ) 20 MG tablet; Take  1 tablet (20 mg total) by mouth daily.  Dispense: 30 tablet; Refill: 1  3. Attention and concentration deficit  - atomoxetine  (STRATTERA ) 40 MG capsule; Take 1 capsule (40 mg total) by mouth daily.  Dispense: 30 capsule; Refill: 2  4. Sleep disturbances  - traZODone  (DESYREL ) 50 MG tablet; Take 1 tablet (50 mg total) by mouth at bedtime as needed for sleep.  Dispense: 30 tablet; Refill: 1  Patient to follow up in 6 weeks Provider spent a total of 23 minutes with the patient/reviewing patient's chart  Krystal FORBES Bolster, PA 08/21/2023, 11:30 AM

## 2023-09-04 ENCOUNTER — Ambulatory Visit (INDEPENDENT_AMBULATORY_CARE_PROVIDER_SITE_OTHER): Admitting: Clinical

## 2023-09-04 DIAGNOSIS — F331 Major depressive disorder, recurrent, moderate: Secondary | ICD-10-CM | POA: Diagnosis not present

## 2023-09-04 NOTE — Progress Notes (Unsigned)
THERAPIST PROGRESS NOTE Virtual Visit via Video Note  I connected with Krystal Glass on 09/04/2023 at  1:00 PM EDT by a video enabled telemedicine application and verified that I am speaking with the correct person using two identifiers.  Location: Patient: home Provider: office   I discussed the limitations of evaluation and management by telemedicine and the availability of in person appointments. The patient expressed understanding and agreed to proceed.   Follow Up Instructions: I discussed the assessment and treatment plan with the patient. The patient was provided an opportunity to ask questions and all were answered. The patient agreed with the plan and demonstrated an understanding of the instructions.   The patient was advised to call back or seek an in-person evaluation if the symptoms worsen or if the condition fails to improve as anticipated.   Session Time: 60 min  Participation Level: Active  Behavioral Response: CasualAlertAnxious  Type of Therapy: Individual Therapy  Treatment Goals addressed: client will engage in at least 80% of scheduled individual psychotherapy sessions  ProgressTowards Goals: Progressing  Interventions: CBT and Supportive  Summary:  Krystal Glass is a 33 y.o. female who presents for the scheduled appointment oriented times five, appropriately dressed and friendly. Client denied hallucinations and delusions. Client reported on today she is doing fairly okay. Client reported she has been at home with her boys. Client reported she likes not having to get up early to take them to school during the summer but is ready for them to go back. Client reported aside from that has been the situational family stressors. Client reported she finds herself in limbo trying to have conversations with her family about the political climate currently. Client reported she also finds herself in disagreements with her sisters because they don't understand how  different her upbringing was as she tries to make relatable points to how they handle their own perceived stressors. Client reported she has had a history of passive suicidal ideations when feeling especially stressed about everything. Client reported she does not mae a plan to harm herself. Evidence of progress towards goal: client reported 1 positive of being able to reframe thoughts of depression/ passive S.I successfully on her own. Client is medications compliant 7 days per week.   Suicidal/Homicidal: Nowithout intent/plan  Therapist Response:  Therapist began the appointment asking the client how she has been doing. Therapist engaged with active listening and positive emotional support. Therapist used cbt to engage and give her time to discuss her thoughts and concerns about family and life in general currently. Therapist used cbt to engage and normalize her emotions within reason. Therapist used CBT ask the client to identify her progress with frequency of use with coping skills with continued practice in her daily activity.    Therapist assigned the client homework to practice self care.   Plan: Return again in 4 weeks.  Diagnosis: mdd, recurrent episode, moderate with anxious distress  Collaboration of Care: Patient refused AEB none requested by the client.  Patient/Guardian was advised Release of Information must be obtained prior to any record release in order to collaborate their care with an outside provider. Patient/Guardian was advised if they have not already done so to contact the registration department to sign all necessary forms in order for us  to release information regarding their care.   Consent: Patient/Guardian gives verbal consent for treatment and assignment of benefits for services provided during this visit. Patient/Guardian expressed understanding and agreed to proceed.   Teegan Guinther Y Remus Hagedorn, LCSW  09/04/2023  

## 2023-09-25 ENCOUNTER — Ambulatory Visit (INDEPENDENT_AMBULATORY_CARE_PROVIDER_SITE_OTHER): Admitting: Clinical

## 2023-09-25 DIAGNOSIS — F331 Major depressive disorder, recurrent, moderate: Secondary | ICD-10-CM

## 2023-10-01 ENCOUNTER — Encounter (HOSPITAL_COMMUNITY): Payer: Self-pay | Admitting: Physician Assistant

## 2023-10-01 ENCOUNTER — Telehealth (HOSPITAL_COMMUNITY): Admitting: Physician Assistant

## 2023-10-01 DIAGNOSIS — G479 Sleep disorder, unspecified: Secondary | ICD-10-CM

## 2023-10-01 DIAGNOSIS — F331 Major depressive disorder, recurrent, moderate: Secondary | ICD-10-CM

## 2023-10-01 DIAGNOSIS — F411 Generalized anxiety disorder: Secondary | ICD-10-CM | POA: Diagnosis not present

## 2023-10-01 DIAGNOSIS — R41840 Attention and concentration deficit: Secondary | ICD-10-CM

## 2023-10-01 NOTE — Progress Notes (Signed)
 [facility] MD/PA/NP OP Progress Note  Virtual Visit via Video Note  I connected with Krystal Glass on 10/01/23 at  3:30 PM EDT by a video enabled telemedicine application and verified that I am speaking with the correct person using two identifiers.  Location: Patient: Home Provider: Clinic   I discussed the limitations of evaluation and management by telemedicine and the availability of in person appointments. The patient expressed understanding and agreed to proceed.  Follow Up Instructions:  I discussed the assessment and treatment plan with the patient. The patient was provided an opportunity to ask questions and all were answered. The patient agreed with the plan and demonstrated an understanding of the instructions.   The patient was advised to call back or seek an in-person evaluation if the symptoms worsen or if the condition fails to improve as anticipated.  I provided 18 minutes of non-face-to-face time during this encounter.  Reginia FORBES Bolster, PA    10/01/2023 3:30 PM Laylee Schooley  MRN:  [id number]  Chief Complaint:  Chief Complaint  Patient presents with   Follow-up   Medication Refill   HPI:   Krystal Glass is a 33 year old female with a past psychiatric history significant for sleep disturbances, major depressive disorder (with anxious distress) and attention/concentration deficit who presents to [facility] via virtual video visit for follow up and medication management.  Patient is currently being managed on the following psychiatric medications:  Trazodone  50 mg at bedtime as needed Atomoxetine  40 mg daily Lexapro  20 mg daily  Patient presents to the encounter stating that her mood has not been fluctuating as much since the increase in Lexapro .  Patient states that she is also starting to ease herself back into the routine of waking up on time so that she can take her children to school on time.  Patient states that her anxiety  has been manageable, especially at night.  Patient endorses some depression and rates her depression a 4 out of 10 with 10 being most severe.  Patient endorses depressive episodes 2 to 3 days/week.  Patient endorses the following depressive symptoms: feelings of sadness (improving), lack of motivation (improving), decreased concentration, irritability (improving), and hopelessness (improving).  Patient denies decreased energy or feelings of guilt/worthlessness.  Patient rates her anxiety a 4 out of 10 and states that the only stressor that she is struggling with is her children's school starting.  A PHQ-9 screen was performed with the patient scoring a 1.  A GAD-7 screen was also performed with the patient scoring an 8.  Patient is alert and oriented x 4, calm, cooperative, and fully engaged in conversation during the encounter.  Patient endorses good mood.  Patient exhibits euthymic mood with appropriate affect.  Patient denies suicidal or homicidal ideations.  She further denies auditory or visual hallucinations and does not appear to be responding to internal/external stimuli.  Patient endorses fair sleep and receives on average 7 hours of sleep per night.  Patient endorses good appetite and eats on average 2-3 meals per day.  Patient denies alcohol consumption, tobacco use, or illicit drug use.  Visit Diagnosis:    ICD-10-CM   1. Major depressive disorder, recurrent episode, moderate with anxious distress (HCC)  F33.1     2. Anxiety state  F41.1     3. Attention and concentration deficit  R41.840     4. Sleep disturbances  G47.9        Past Psychiatric History:  Major depressive disorder with  anxious distress Sleep disturbances Attention and concentration deficit  Past Medical History:  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 10/12/2015   Anxiety    Closed displaced fracture of left clavicle 10/12/2015   Closed displaced segmental  fracture of shaft of left humerus 10/09/2015   Gestational diabetes    PCOS (polycystic ovarian syndrome)    PCOS (polycystic ovarian syndrome)    Postpartum depression     Past Surgical History:  Procedure Laterality Date   FRACTURE SURGERY     ORIF HUMERUS FRACTURE Left 10/10/2015   Procedure: OPEN REDUCTION INTERNAL FIXATION (ORIF) LEFT HUMERUS AND CLAVICLE FRACTURE;  Surgeon: Ozell Bruch, MD;  Location: [facility] OR;  Service: Orthopedics;  Laterality: Left;    Family Psychiatric History:  Patient denies a family history of psychiatric illness.  She reports there are no documents regarding family members having mental health due to parents disbelief around mental health.   Family History:  Family History  Problem Relation Age of Onset   Hypothyroidism Mother    Alcohol abuse Father    Anxiety disorder Father    Diabetes Father    Miscarriages / Stillbirths Maternal Aunt    Varicose Veins Maternal Aunt     Social History:  Social History   Socioeconomic History   Marital status: Married    Spouse name: Not on file   Number of children: Not on file   Years of education: Not on file   Highest education level: Not on file  Occupational History   Not on file  Tobacco Use   Smoking status: Never   Smokeless tobacco: Never  Vaping Use   Vaping status: Never Used  Substance and Sexual Activity   Alcohol use: Not Currently    Comment: OCC   Drug use: No   Sexual activity: Yes    Birth control/protection: None  Other Topics Concern   Not on file  Social History Narrative   Not on file   Social Drivers of Health   Financial Resource Strain: Medium Risk (07/25/2023)   Overall Financial Resource Strain (CARDIA)    Difficulty of Paying Living Expenses: Somewhat hard  Food Insecurity: No Food Insecurity (07/25/2023)   Hunger Vital Sign    Worried About Running Out of Food in the Last Year: Never true    Ran Out of Food in the Last Year: Never true  Transportation Needs:  No Transportation Needs (07/25/2023)   PRAPARE - Administrator, Civil Service (Medical): No    Lack of Transportation (Non-Medical): No  Physical Activity: Sufficiently Active (07/25/2023)   Exercise Vital Sign    Days of Exercise per Week: 7 days    Minutes of Exercise per Session: 60 min  Stress: No Stress Concern Present (07/25/2023)   [employer] of Occupational Health - Occupational Stress Questionnaire    Feeling of Stress: Not at all  Social Connections: Moderately Isolated (07/25/2023)   Social Connection and Isolation Panel    Frequency of Communication with Friends and Family: More than three times a week    Frequency of Social Gatherings with Friends and Family: More than three times a week    Attends Religious Services: Never    Database administrator or Organizations: No    Attends Banker Meetings: Never    Marital Status: Married    Allergies: No Known Allergies  Metabolic Disorder Labs: Lab Results  Component Value Date  HGBA1C 6.1 (H) 07/25/2023   MPG 137 11/02/2020   No results found for: PROLACTIN Lab Results  Component Value Date   CHOL 222 (H) 11/02/2020   TRIG 168 (H) 11/02/2020   HDL 47 11/02/2020   CHOLHDL 4.7 11/02/2020   VLDL 34 11/02/2020   LDLCALC 141 (H) 11/02/2020   Lab Results  Component Value Date   TSH 0.993 07/25/2023   TSH 0.650 02/20/2022    Therapeutic Level Labs: No results found for: LITHIUM No results found for: VALPROATE No results found for: CBMZ  Current Medications: Current Outpatient Medications  Medication Sig Dispense Refill   atomoxetine  (STRATTERA ) 40 MG capsule Take 1 capsule (40 mg total) by mouth daily. 30 capsule 2   escitalopram  (LEXAPRO ) 20 MG tablet Take 1 tablet (20 mg total) by mouth daily. 30 tablet 1   hydrOXYzine  (ATARAX ) 10 MG tablet Take 1 tablet (10 mg total) by mouth 3 (three) times daily as needed. (Patient not taking: Reported on 07/25/2023) 75 tablet 1    metFORMIN  (GLUCOPHAGE -XR) 500 MG 24 hr tablet TAKE 1 TABLET BY MOUTH DAILY WITH BREAKFAST 30 tablet 0   Multiple Vitamin (MULTIVITAMIN WITH MINERALS) TABS tablet Take 1 tablet by mouth daily.     traZODone  (DESYREL ) 50 MG tablet Take 1 tablet (50 mg total) by mouth at bedtime as needed for sleep. 30 tablet 1   Vitamin D , Ergocalciferol , (DRISDOL ) 1.25 MG (50000 UNIT) CAPS capsule Take 1 capsule (50,000 Units total) by mouth every 7 (seven) days. TAKE ONCE A WEEK (Patient not taking: Reported on 07/25/2023) 12 capsule 0   No current facility-administered medications for this visit.     Musculoskeletal: Strength & Muscle Tone: within normal limits Gait & Station: normal Patient leans: N/A  Psychiatric Specialty Exam: Review of Systems  Psychiatric/Behavioral:  Positive for dysphoric mood and sleep disturbance. Negative for decreased concentration, hallucinations, self-injury and suicidal ideas. The patient is nervous/anxious. The patient is not hyperactive.     There were no vitals taken for this visit.There is no height or weight on file to calculate BMI.  General Appearance: Casual  Eye Contact:  Good  Speech:  Clear and Coherent and Normal Rate  Volume:  Normal  Mood:  Anxious and Depressed  Affect:  Appropriate  Thought Process:  Coherent, Goal Directed, and Descriptions of Associations: Intact  Orientation:  Full (Time, Place, and Person)  Thought Content: WDL   Suicidal Thoughts:  No  Homicidal Thoughts:  No  Memory:  Immediate;   Good Recent;   Good Remote;   Good  Judgement:  Good  Insight:  Good  Psychomotor Activity:  Normal  Concentration:  Concentration: Good and Attention Span: Good  Recall:  Good  Fund of Knowledge: Good  Language: Good  Akathisia:  No  Handed:  Right  AIMS (if indicated): not done  Assets:  Communication Skills Desire for Improvement Financial Resources/Insurance Housing Social Support Transportation  ADL's:  Intact  Cognition: WNL   Sleep:  Fair   Screenings: GAD-7    Flowsheet Row Video Visit from 10/01/2023 in [facility] Video Visit from 08/21/2023 in [facility] Office Visit from 07/25/2023 in [facility] [place] Video Visit from 07/10/2023 in [facility] Video Visit from 05/08/2023 in [facility]  Total GAD-7 Score 8 18 19 19 17    PHQ2-9    Flowsheet Row Video Visit  from 10/01/2023 in [facility] Video Visit from 08/21/2023 in [facility] Office Visit from 07/25/2023 in [facility] [place] [facility] [place] Video Visit from 07/10/2023 in [facility] Video Visit from 05/08/2023 in [facility]  PHQ-2 Total Score 1 5 2 3 3   PHQ-9 Total Score -- 21 14 16 16    Flowsheet Row Video Visit from 10/01/2023 in [facility] Video Visit from 08/21/2023 in [facility] Video Visit from 07/10/2023 in [facility]  C-SSRS RISK CATEGORY Moderate Risk Moderate Risk Moderate Risk     Assessment and Plan:   Krystal Glass is a 33 year old female with a past psychiatric history significant for sleep disturbances, major depressive disorder (with anxious distress) and attention/concentration deficit who presents to [facility] via virtual video visit for follow up and medication management.  Patient presents to the encounter stating that she has been taking her medications regularly and denies experiencing any adverse side effects.  She reports that her mood has not been fluctuating as much since the increase in Lexapro  and that she is starting to ease herself back into routines that she used to do.  Patient  still continues to endorse some depression and anxiety but states that her symptoms are much more manageable.  A PHQ-9 screen was performed with the patient scoring a 1.  A GAD-7 screen was also performed with the patient scoring an 8.  Patient's PHQ-9 and GAD-7 screen scores have decreased from the last encounter.  Patient endorses stability and would like to continue taking her medications as prescribed.  A [place] Suicide Severity Rating Scale was performed with the patient being considered moderate risk.  Patient denies suicidal ideations and is able to contract for safety at this time.  Safety planning was discussed with the patient prior to the conclusion of the encounter.  - Patient was instructed to contact 911 in the event of a mental health crisis. - Patient was instructed to contact [facility] in the event of a mental health crisis. - Patient was instructed to present to [facility] in the event of a mental health crisis.  Collaboration of Care: Collaboration of Care: Medication Management AEB Provider managing patient's psychiatric medications, Primary Care Provider AEB patient being seen by internal medicine, Psychiatrist AEB patient being followed by mental health provider at this facility, and Referral or follow-up with counselor/therapist AEB patient being seen by licensed clinical social worker at this facility  Patient/Guardian was advised Release of Information must be obtained prior to any record release in order to collaborate their care with an outside provider. Patient/Guardian was advised if they have not already done so to contact the registration department to sign all necessary forms in order for us  to release information regarding their care.   Consent: Patient/Guardian gives verbal consent for treatment and assignment of benefits for services provided during this visit. Patient/Guardian expressed understanding and agreed  to proceed.   1. Major depressive disorder, recurrent episode, moderate with anxious distress (HCC) (Primary)  - escitalopram  (LEXAPRO ) 20 MG tablet; Take 1 tablet (20 mg total) by mouth daily.  Dispense: 30 tablet; Refill: 2 - traZODone  (DESYREL ) 50 MG tablet; Take 1 tablet (50 mg total) by mouth at bedtime as needed for sleep.  Dispense: 30 tablet; Refill: 2  2. Anxiety state  - escitalopram  (LEXAPRO ) 20 MG tablet; Take 1 tablet (20 mg total) by mouth daily.  Dispense: 30 tablet; Refill: 2  3. Attention and concentration deficit  - atomoxetine  (STRATTERA ) 40 MG capsule; Take 1 capsule (40 mg total) by mouth daily.  Dispense: 30 capsule; Refill: 2  4. Sleep disturbances  - traZODone  (DESYREL ) 50 MG tablet; Take 1 tablet (50 mg total) by mouth at bedtime as needed for sleep.  Dispense: 30 tablet; Refill: 2  Patient to follow up in 2 months Provider spent a total of 18 minutes with the patient/reviewing patient's chart  Reginia FORBES Bolster, PA 10/01/2023, 3:30 PM

## 2023-10-05 MED ORDER — ESCITALOPRAM OXALATE 20 MG PO TABS: 20.0000 mg | ORAL_TABLET | Freq: Every day | ORAL | 2 refills | Status: AC

## 2023-10-05 MED ORDER — TRAZODONE HCL 50 MG PO TABS: 50.0000 mg | ORAL_TABLET | Freq: Every evening | ORAL | 2 refills | Status: AC | PRN

## 2023-10-05 MED ORDER — ATOMOXETINE HCL 40 MG PO CAPS: 40.0000 mg | ORAL_CAPSULE | Freq: Every day | ORAL | 2 refills | Status: DC

## 2023-10-05 NOTE — Progress Notes (Signed)
   THERAPIST PROGRESS NOTE Virtual Visit via Video Note  I connected with Krystal Glass on 09/25/2023 at  4:00 PM EDT by a video enabled telemedicine application and verified that I am speaking with the correct person using two identifiers.  Location: Patient: home Provider: office   I discussed the limitations of evaluation and management by telemedicine and the availability of in person appointments. The patient expressed understanding and agreed to proceed.   Follow Up Instructions:  I discussed the assessment and treatment plan with the patient. The patient was provided an opportunity to ask questions and all were answered. The patient agreed with the plan and demonstrated an understanding of the instructions.   The patient was advised to call back or seek an in-person evaluation if the symptoms worsen or if the condition fails to improve as anticipated.   Session Time: 45 min  Participation Level: Active  Behavioral Response: CasualAlertIrritable  Type of Therapy: Individual Therapy  Treatment Goals addressed: client will participate in at least 80% of scheduled individual psychotherapy sessions  ProgressTowards Goals: Progressing  Interventions: CBT  Summary:  Krystal Glass is a 33 y.o. female who presents for the scheduled appointment oriented times five, appropriately dressed and friendly. Client denied hallucinations and delusions. Client reported she is doing okay but has been irritated. Client reported she has been feeling like her family goes out the way for her as she does for them. Client reported she recently participated in a cultural dance group. Client reported she well prepared her family to attend. Client reported the day of they all missed her performance and arrived after she completed. Client reported there are a sequence of events that support she still shows up and goes above and beyond for her siblings as well when her parents could do more. Client reported  her siblings also make many comments about how they belittle how her experience growing up differed from theirs. Client reported she has times when she feels irritated about how do not consider her feelings. Evidence of progress towards goal:  client reported 1 positive of being able to appropriately communicate her frustrations with her family.   Suicidal/Homicidal: Nowithout intent/plan  Therapist Response:  Therapist began the appointment asking the client how she has been doing. Therapist engaged using active listening and positive emotional support. Therapist used cbt to engage and validate her thoughts and feelings within reasons. Therapist used cbt to teach the client about appropriate communication skills. Therapist used CBT ask the client to identify her progress with frequency of use with coping skills with continued practice in her daily activity.    Therapist assigned the client homework to practice self care.   Plan: Return again in 3 weeks.  Diagnosis: mdd, recurrent episode, moderate with anxious distress  Collaboration of Care: Patient refused AEB none requested by the client.  Patient/Guardian was advised Release of Information must be obtained prior to any record release in order to collaborate their care with an outside provider. Patient/Guardian was advised if they have not already done so to contact the registration department to sign all necessary forms in order for us  to release information regarding their care.   Consent: Patient/Guardian gives verbal consent for treatment and assignment of benefits for services provided during this visit. Patient/Guardian expressed understanding and agreed to proceed.   Reuben Knoblock Y [place] Lebeau, LCSW 09/25/2023

## 2023-10-09 ENCOUNTER — Encounter (HOSPITAL_COMMUNITY): Payer: Self-pay

## 2023-10-09 ENCOUNTER — Ambulatory Visit (HOSPITAL_COMMUNITY): Admitting: Clinical

## 2023-10-30 ENCOUNTER — Ambulatory Visit (INDEPENDENT_AMBULATORY_CARE_PROVIDER_SITE_OTHER): Admitting: Clinical

## 2023-10-30 DIAGNOSIS — F331 Major depressive disorder, recurrent, moderate: Secondary | ICD-10-CM

## 2023-10-30 NOTE — Progress Notes (Unsigned)
 THERAPIST PROGRESS NOTE Virtual Visit via Video Note  I connected with Krystal Glass on 10/30/2023 at  9:00 AM EDT by a video enabled telemedicine application and verified that I am speaking with the correct person using two identifiers.  Location: Patient: home Provider: office   I discussed the limitations of evaluation and management by telemedicine and the availability of in person appointments. The patient expressed understanding and agreed to proceed.   Follow Up Instructions: I discussed the assessment and treatment plan with the patient. The patient was provided an opportunity to ask questions and all were answered. The patient agreed with the plan and demonstrated an understanding of the instructions.   The patient was advised to call back or seek an in-person evaluation if the symptoms worsen or if the condition fails to improve as anticipated.    Session Time: 60 min  Participation Level: Active  Behavioral Response: CasualAlertIrritable  Type of Therapy: Individual Therapy  Treatment Goals addressed: client will engage in at least 80% of scheduled individual psychotherapy sessions  ProgressTowards Goals: Progressing  Interventions: CBT and Supportive  Summary:  Krystal Glass is a 33 y.o. female who presents for the scheduled appointment oriented x 5, appropriately dressed, and friendly.  Client denied hallucinations and delusions. Client reported on today she has had a mix of emotions.  Client reported she feels like she and her husband are now in a position to really come to a decision about whether or not they should move out of the country.  Client reported although her paperwork is in progress she is still worried about 1 day maybe being picked up and detained.  Client reported she has so many stories about friends and support groups that she is seeing about stories that are happening.  Client reported her parents always speak down on her moving.  Client reported  this situation has continuously brought her back to thoughts of remembering her transition from [place] to [place]  and how unprepared she was for it.  Client reported over time the relationship with her parents has been up and down because her upbringing was different from what her siblings had to go through for her younger than her.  Client reported she worries that if she does move her relationship with her parents could become nonexistent although she does have a connection with the narrow is still has a dry patches.  Client reported she wishes that she had the relationship with her parents like her parents have with her siblings.  Client reported she is trying to find positive out of a seemingly difficult situation. Client reported other wise they will be celebrating her sons birthday this weekend and it should be a good time. Evidence of progress towards goal:  client reported 1 positive of being able to constructively process her emotions.  Suicidal/Homicidal: Nowithout intent/plan  Therapist Response:  Therapist began the appointment asking the client how she has been doing since last seen. Therapist engaged with active listening and positive emotional support. Therapist used CBT to ask client about thoughts and feelings she has been having lately that relate to points of concern that she has identified. Therapist used CBT to give her time to process her thoughts and feelings and normalized her thoughts and emotions within reason. Therapist used CBT ask the client to identify her progress with frequency of use with coping skills with continued practice in her daily activity.      Plan: Return again in 4 weeks.  Diagnosis: major  depressive disorder, recurrent episode, moderate with anxious distress  Collaboration of Care: Patient refused AEB none requested by the client.  Patient/Guardian was advised Release of Information must be obtained prior to any record release in order to  collaborate their care with an outside provider. Patient/Guardian was advised if they have not already done so to contact the registration department to sign all necessary forms in order for us  to release information regarding their care.   Consent: Patient/Guardian gives verbal consent for treatment and assignment of benefits for services provided during this visit. Patient/Guardian expressed understanding and agreed to proceed.   Krystal Mancillas Y Hamed Debella, LCSW 10/30/2023

## 2023-11-12 ENCOUNTER — Encounter (HOSPITAL_COMMUNITY): Payer: Self-pay

## 2023-11-12 ENCOUNTER — Ambulatory Visit (HOSPITAL_COMMUNITY): Admitting: Clinical

## 2023-11-18 ENCOUNTER — Encounter (HOSPITAL_COMMUNITY): Payer: Self-pay

## 2023-11-18 ENCOUNTER — Telehealth (HOSPITAL_COMMUNITY): Admitting: Physician Assistant

## 2023-12-11 ENCOUNTER — Ambulatory Visit (INDEPENDENT_AMBULATORY_CARE_PROVIDER_SITE_OTHER): Admitting: Clinical

## 2023-12-11 DIAGNOSIS — F331 Major depressive disorder, recurrent, moderate: Secondary | ICD-10-CM | POA: Diagnosis not present

## 2023-12-20 NOTE — Progress Notes (Unsigned)
   THERAPIST PROGRESS NOTE Virtual Visit via Video Note  I connected with Krystal Glass on 12/11/2023 at  8:00 AM EDT by a video enabled telemedicine application and verified that I am speaking with the correct person using two identifiers.  Location: Patient: home Provider: office   I discussed the limitations of evaluation and management by telemedicine and the availability of in person appointments. The patient expressed understanding and agreed to proceed.   Follow Up Instructions: I discussed the assessment and treatment plan with the patient. The patient was provided an opportunity to ask questions and all were answered. The patient agreed with the plan and demonstrated an understanding of the instructions.   The patient was advised to call back or seek an in-person evaluation if the symptoms worsen or if the condition fails to improve as anticipated.    Session Time: 45 min  Participation Level: Active  Behavioral Response: CasualAlertAnxious  Type of Therapy: Individual Therapy  Treatment Goals addressed: client will engage in at least 80% of scheduled individual psychotherapy sessions  ProgressTowards Goals: Progressing  Interventions: CBT  Summary:  Krystal Glass is a 33 y.o. female who presents for the scheduled appointment oriented times five, appropriately dressed and friendly. Client denied hallucinations and delusions. Client reported she has had mixed emotions about her family. Client reported she constantly feels like she has to run behind her family to make plans work out. Client reported for example she wants to take professional photos with her mom and sisters. Client reported that turned into a argument. Client reported her younger sister who is 71 does not hold accountability for her actions wither expecting her to take the responsibility on everything.  Evidence of progress towards goal:  client reported 1 cognitive pattern of believing she has to fix  everything within her family leads to feels of disappointment.  Suicidal/Homicidal: Nowithout intent/plan  Therapist Response:  Therapist began the appointment asking the client how she has been doing. Therapist engaged with active listening and positive emotional support. Therapist used cbt to engage and ask about her emotions and related stressors. Therapist used cbt to teach her about reframing cognitive patterns that enable emotions to do what's outside of her responsibility. Therapist used CBT ask the client to identify her progress with frequency of use with coping skills with continued practice in her daily activity.    Therapist assigned the client homework to think about the boundaries that were discussed.   Plan: Return again in 4 weeks.  Diagnosis: mdd, recurrent episode, moderate with anxious distress  Collaboration of Care: Patient refused AEB none requested by the client.  Patient/Guardian was advised Release of Information must be obtained prior to any record release in order to collaborate their care with an outside provider. Patient/Guardian was advised if they have not already done so to contact the registration department to sign all necessary forms in order for us  to release information regarding their care.   Consent: Patient/Guardian gives verbal consent for treatment and assignment of benefits for services provided during this visit. Patient/Guardian expressed understanding and agreed to proceed.   Rykin Route Y Jaleal Schliep, LCSW 12/11/2023

## 2023-12-26 ENCOUNTER — Telehealth: Payer: Self-pay | Admitting: Nurse Practitioner

## 2023-12-26 NOTE — Telephone Encounter (Signed)
 Contacted pt resch appt!

## 2024-01-01 ENCOUNTER — Encounter (HOSPITAL_COMMUNITY): Payer: Self-pay

## 2024-01-01 ENCOUNTER — Ambulatory Visit (INDEPENDENT_AMBULATORY_CARE_PROVIDER_SITE_OTHER): Admitting: Clinical

## 2024-01-01 DIAGNOSIS — F331 Major depressive disorder, recurrent, moderate: Secondary | ICD-10-CM | POA: Diagnosis not present

## 2024-01-01 NOTE — Progress Notes (Signed)
 THERAPIST PROGRESS NOTE Virtual Visit via Video Note  I connected with [name] on 01/01/24 at  8:00 AM EST by a video enabled telemedicine application and verified that I am speaking with the correct person using two identifiers.  Location: Patient: home Provider: office   I discussed the limitations of evaluation and management by telemedicine and the availability of in person appointments. The patient expressed understanding and agreed to proceed.   Follow Up Instructions: I discussed the assessment and treatment plan with the patient. The patient was provided an opportunity to ask questions and all were answered. The patient agreed with the plan and demonstrated an understanding of the instructions.   The patient was advised to call back or seek an in-person evaluation if the symptoms worsen or if the condition fails to improve as anticipated.   Session Time: 50 min  Participation Level: Active  Behavioral Response: CasualAlertEuthymic  Type of Therapy: Individual Therapy  Treatment Goals addressed: client will engage in at least 80% of scheduled individual psychotherapy sessions  ProgressTowards Goals: Progressing  Interventions: CBT  Summary:  Kyann Heydt is a 33 y.o. female who presents for scheduled appointment oriented x 5, appropriately dressed, and friendly.  Client denied hallucinations and delusions. Client reported on today she has been having a mix of emotions.  Client reported 1 positive is that she has gotten back on track with taking her psychiatric medications that she should.  Client reported she does feel apprehensive about its effectiveness to help manage her emotions.  Client reported ongoing she has a lot of thoughts pertaining to the political climate and how that could affect her family at any point in time.  Client reported she has a sense of guilt for the freedom that she has but she worries about others that could potentially be picked up  and reported at any time.  Client reported this political climate has caused a rift with some of her family members.  Client reported this time of the year particularly sparks a lot of those thoughts about childhood history and particular family members because she has to see them during the holidays.   Evidence of progress towards goal: Client reported she is taking her psychiatric medications 7 days a week.  Suicidal/Homicidal: Nowithout intent/plan  Therapist Response:  Therapist began the appointment asking client how she has been doing since last seen. Therapist engaged active listening and positive emotional support. Therapist used CBT to engage and asked the client about her success with medication compliance and any barriers to maintaining that. Therapist used CBT to ask client open-ended questions and clarifying questions about what is contributing to any negative thoughts and emotions. Therapist used CBT to normalize her emotions within reason and continue to teach her about appropriate boundaries. Therapist used CBT ask the client to identify her progress with frequency of use with coping skills with continued practice in her daily activity.    Therapist assigned her homework to practice self-care.   Plan: Return again in 4 weeks.  Diagnosis: mdd, recurrent episode, moderate with anxious distress  Collaboration of Care: Patient refused AEB none requested by the client.  Patient/Guardian was advised Release of Information must be obtained prior to any record release in order to collaborate their care with an outside provider. Patient/Guardian was advised if they have not already done so to contact the registration department to sign all necessary forms in order for us  to release information regarding their care.   Consent: Patient/Guardian gives verbal consent  for treatment and assignment of benefits for services provided during this visit. Patient/Guardian expressed understanding  and agreed to proceed.   Refael Fulop Y Zakya Halabi, LCSW 01/01/2024

## 2024-01-29 ENCOUNTER — Ambulatory Visit (HOSPITAL_COMMUNITY): Admitting: Clinical

## 2024-01-29 ENCOUNTER — Encounter (HOSPITAL_COMMUNITY): Payer: Self-pay

## 2024-01-30 ENCOUNTER — Encounter (HOSPITAL_COMMUNITY): Payer: Self-pay

## 2024-01-30 ENCOUNTER — Ambulatory Visit: Payer: Self-pay | Admitting: Nurse Practitioner

## 2024-01-30 ENCOUNTER — Encounter (HOSPITAL_COMMUNITY): Payer: Self-pay | Admitting: Physician Assistant

## 2024-01-30 ENCOUNTER — Telehealth (HOSPITAL_COMMUNITY): Admitting: Physician Assistant

## 2024-01-30 DIAGNOSIS — F411 Generalized anxiety disorder: Secondary | ICD-10-CM

## 2024-01-30 DIAGNOSIS — F331 Major depressive disorder, recurrent, moderate: Secondary | ICD-10-CM

## 2024-01-30 DIAGNOSIS — R41840 Attention and concentration deficit: Secondary | ICD-10-CM

## 2024-01-30 MED ORDER — ATOMOXETINE HCL 40 MG PO CAPS: 40.0000 mg | ORAL_CAPSULE | Freq: Every day | ORAL | 1 refills | Status: DC

## 2024-01-30 MED ORDER — ESCITALOPRAM OXALATE 20 MG PO TABS: 20.0000 mg | ORAL_TABLET | Freq: Every day | ORAL | 1 refills | Status: DC

## 2024-01-30 MED ORDER — GABAPENTIN 100 MG PO CAPS: 100.0000 mg | ORAL_CAPSULE | Freq: Three times a day (TID) | ORAL | 1 refills | Status: DC

## 2024-01-30 NOTE — Progress Notes (Signed)
 [facility] MD/PA/NP OP Progress Note  Virtual Visit via Video Note  I connected with Krystal Glass on 01/30/24 at  3:30 PM EST by a video enabled telemedicine application and verified that I am speaking with the correct person using two identifiers.  Location: Patient: Home Provider: Clinic   I discussed the limitations of evaluation and management by telemedicine and the availability of in person appointments. The patient expressed understanding and agreed to proceed.  Follow Up Instructions:  I discussed the assessment and treatment plan with the patient. The patient was provided an opportunity to ask questions and all were answered. The patient agreed with the plan and demonstrated an understanding of the instructions.   The patient was advised to call back or seek an in-person evaluation if the symptoms worsen or if the condition fails to improve as anticipated.  I provided 18 minutes of non-face-to-face time during this encounter.  Reginia FORBES Bolster, PA    01/30/2024 3:30 PM Krystal Glass  MRN:  [id number]  Chief Complaint:  Chief Complaint  Patient presents with   Follow-up   Medication Management   HPI:   Krystal Glass is a 33 year old female with a past psychiatric history significant for sleep disturbances, major depressive disorder (with anxious distress) and attention/concentration deficit who presents to [facility] via virtual video visit for follow up and medication management.  Patient is currently being managed on the following psychiatric medications:  Trazodone  50 mg at bedtime as needed Atomoxetine  40 mg daily Lexapro  20 mg daily  Patient reports that she has been taking her Strattera  regularly and states that the medication has been working well with her focus and concentration.  She reports that she has been experiencing irrational anxiety.  She reports that she will experience anxiety over irrational scenarios that she makes  up in her mind.  For example, patient reports that she went out to eat with her sister recently.  While she was out with her sister, patient stepped away to go to the bathroom.  While she was in the bathroom, patient reports that she thought up a scenario in which a shooter comes into the restaurant and shoots up the whole place, including her sister, leaving the patient with survivor's guilt.  Patient also reports that she is stressed out about having impostor syndrome and that she is putting on a facade that everyone will eventually uncover.  Patient reports that these thoughts and scenarios that she thinks up have always been present in her life but states that this is the first time that she has addressed them.  Patient reports that her depression has been okay.  She reports that she has been caught up in the whirlwind of the holidays and has not felt as sad lately.  Patient rates her anxiety a 5 out of 10 but denies any new stressors at this time.  A PHQ-9 screen was performed with the patient scoring a 16.  A GAD-7 screen was also performed the patient scoring a 17.  Patient is alert and oriented x 4, calm, cooperative, and fully engaged in conversation during the encounter.  Patient endorses neutral mood.  Patient exhibits anxious mood with congruent affect.  Patient denies suicidal or homicidal ideation.  She further denies auditory or visual hallucinations and does not appear to be responding to internal/external stimuli.  Patient endorses fair sleep and receives on average 5 to 7 hours of sleep per night.  Patient endorses good appetite and eats on average  2-3 meals per day.  Patient denies alcohol consumption, tobacco use, or illicit drug use.  Visit Diagnosis:    ICD-10-CM   1. Major depressive disorder, recurrent episode, moderate with anxious distress (HCC)  F33.1 escitalopram  (LEXAPRO ) 20 MG tablet    2. Anxiety state  F41.1 escitalopram  (LEXAPRO ) 20 MG tablet    gabapentin  (NEURONTIN ) 100  MG capsule    3. Attention and concentration deficit  R41.840 atomoxetine  (STRATTERA ) 40 MG capsule      Past Psychiatric History:  Major depressive disorder with anxious distress Sleep disturbances Attention and concentration deficit  Past Medical History:  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 10/12/2015   Anxiety    Closed displaced fracture of left clavicle 10/12/2015   Closed displaced segmental fracture of shaft of left humerus 10/09/2015   Gestational diabetes    PCOS (polycystic ovarian syndrome)    PCOS (polycystic ovarian syndrome)    Postpartum depression     Past Surgical History:  Procedure Laterality Date   FRACTURE SURGERY     ORIF HUMERUS FRACTURE Left 10/10/2015   Procedure: OPEN REDUCTION INTERNAL FIXATION (ORIF) LEFT HUMERUS AND CLAVICLE FRACTURE;  Surgeon: Ozell Bruch, MD;  Location: [facility] OR;  Service: Orthopedics;  Laterality: Left;    Family Psychiatric History:  Patient denies a family history of psychiatric illness.  She reports there are no documents regarding family members having mental health due to parents disbelief around mental health.   Family History:  Family History  Problem Relation Age of Onset   Hypothyroidism Mother    Alcohol abuse Father    Anxiety disorder Father    Diabetes Father    Miscarriages / Stillbirths Maternal Aunt    Varicose Veins Maternal Aunt     Social History:  Social History   Socioeconomic History   Marital status: Married    Spouse name: Not on file   Number of children: Not on file   Years of education: Not on file   Highest education level: Not on file  Occupational History   Not on file  Tobacco Use   Smoking status: Never   Smokeless tobacco: Never  Vaping Use   Vaping status: Never Used  Substance and Sexual Activity   Alcohol use: Not Currently    Comment: OCC   Drug use: No   Sexual activity: Yes    Birth control/protection: None   Other Topics Concern   Not on file  Social History Narrative   Not on file   Social Drivers of Health   Tobacco Use: Low Risk (01/30/2024)   Patient History    Smoking Tobacco Use: Never    Smokeless Tobacco Use: Never    Passive Exposure: Not on file  Financial Resource Strain: Medium Risk (07/25/2023)   Overall Financial Resource Strain (CARDIA)    Difficulty of Paying Living Expenses: Somewhat hard  Food Insecurity: No Food Insecurity (07/25/2023)   Epic    Worried About Programme Researcher, Broadcasting/film/video in the Last Year: Never true    Ran Out of Food in the Last Year: Never true  Transportation Needs: No Transportation Needs (07/25/2023)   Epic    Lack of Transportation (Medical): No    Lack of Transportation (Non-Medical): No  Physical Activity: Sufficiently Active (07/25/2023)   Exercise Vital Sign    Days of Exercise per Week: 7 days    Minutes of Exercise per Session: 60 min  Stress: No Stress Concern  Present (07/25/2023)   [employer] of Occupational Health - Occupational Stress Questionnaire    Feeling of Stress: Not at all  Social Connections: Moderately Isolated (07/25/2023)   Social Connection and Isolation Panel    Frequency of Communication with Friends and Family: More than three times a week    Frequency of Social Gatherings with Friends and Family: More than three times a week    Attends Religious Services: Never    Database Administrator or Organizations: No    Attends Banker Meetings: Never    Marital Status: Married  Depression (PHQ2-9): High Risk (01/30/2024)   Depression (PHQ2-9)    PHQ-2 Score: 16  Alcohol Screen: Not on file  Housing: Low Risk (07/25/2023)   Epic    Unable to Pay for Housing in the Last Year: No    Number of Times Moved in the Last Year: 0    Homeless in the Last Year: No  Utilities: Not At Risk (07/25/2023)   Epic    Threatened with loss of utilities: No  Health Literacy: Adequate Health Literacy (07/25/2023)   B1300 Health  Literacy    Frequency of need for help with medical instructions: Never    Allergies: No Known Allergies  Metabolic Disorder Labs: Lab Results  Component Value Date   HGBA1C 6.1 (H) 07/25/2023   MPG 137 11/02/2020   No results found for: PROLACTIN Lab Results  Component Value Date   CHOL 222 (H) 11/02/2020   TRIG 168 (H) 11/02/2020   HDL 47 11/02/2020   CHOLHDL 4.7 11/02/2020   VLDL 34 11/02/2020   LDLCALC 141 (H) 11/02/2020   Lab Results  Component Value Date   TSH 0.993 07/25/2023   TSH 0.650 02/20/2022    Therapeutic Level Labs: No results found for: LITHIUM No results found for: VALPROATE No results found for: CBMZ  Current Medications: Current Outpatient Medications  Medication Sig Dispense Refill   gabapentin  (NEURONTIN ) 100 MG capsule Take 1 capsule (100 mg total) by mouth 3 (three) times daily. 90 capsule 1   atomoxetine  (STRATTERA ) 40 MG capsule Take 1 capsule (40 mg total) by mouth daily. 30 capsule 1   escitalopram  (LEXAPRO ) 20 MG tablet Take 1 tablet (20 mg total) by mouth daily. 30 tablet 1   hydrOXYzine  (ATARAX ) 10 MG tablet Take 1 tablet (10 mg total) by mouth 3 (three) times daily as needed. (Patient not taking: Reported on 07/25/2023) 75 tablet 1   metFORMIN  (GLUCOPHAGE -XR) 500 MG 24 hr tablet TAKE 1 TABLET BY MOUTH DAILY WITH BREAKFAST 30 tablet 0   Multiple Vitamin (MULTIVITAMIN WITH MINERALS) TABS tablet Take 1 tablet by mouth daily.     traZODone  (DESYREL ) 50 MG tablet Take 1 tablet (50 mg total) by mouth at bedtime as needed for sleep. 30 tablet 2   Vitamin D , Ergocalciferol , (DRISDOL ) 1.25 MG (50000 UNIT) CAPS capsule Take 1 capsule (50,000 Units total) by mouth every 7 (seven) days. TAKE ONCE A WEEK (Patient not taking: Reported on 07/25/2023) 12 capsule 0   No current facility-administered medications for this visit.     Musculoskeletal: Strength & Muscle Tone: within normal limits Gait & Station: normal Patient leans:  N/A  Psychiatric Specialty Exam: Review of Systems  Psychiatric/Behavioral:  Positive for dysphoric mood and sleep disturbance. Negative for decreased concentration, hallucinations, self-injury and suicidal ideas. The patient is nervous/anxious. The patient is not hyperactive.     There were no vitals taken for this visit.There is no height or weight  on file to calculate BMI.  General Appearance: Casual  Eye Contact:  Good  Speech:  Clear and Coherent and Normal Rate  Volume:  Normal  Mood:  Anxious and Depressed  Affect:  Congruent  Thought Process:  Coherent, Goal Directed, and Descriptions of Associations: Intact  Orientation:  Full (Time, Place, and Person)  Thought Content: WDL   Suicidal Thoughts:  No  Homicidal Thoughts:  No  Memory:  Immediate;   Good Recent;   Good Remote;   Good  Judgement:  Good  Insight:  Good  Psychomotor Activity:  Normal  Concentration:  Concentration: Good and Attention Span: Good  Recall:  Good  Fund of Knowledge: Good  Language: Good  Akathisia:  No  Handed:  Right  AIMS (if indicated): not done  Assets:  Communication Skills Desire for Improvement Financial Resources/Insurance Housing Social Support Transportation  ADL's:  Intact  Cognition: WNL  Sleep:  Fair   Screenings: GAD-7    Flowsheet Row Video Visit from 01/30/2024 in [facility] Video Visit from 10/01/2023 in [facility] Video Visit from 08/21/2023 in [facility] Office Visit from 07/25/2023 in [facility] [place] Video Visit from 07/10/2023 in [facility]  Total GAD-7 Score 17 8 18 19 19    PHQ2-9    Flowsheet Row Video Visit from 01/30/2024 in [facility] Video Visit from 10/01/2023 in [facility] Video Visit from 08/21/2023 in [facility] Office Visit from 07/25/2023 in [facility] [place] [facility] [place]  PHQ-2 Total Score 2 1 5 2 3   PHQ-9 Total Score 16 -- 21 14 16    Flowsheet Row Video Visit from 01/30/2024 in [facility] Video Visit from 10/01/2023 in [facility] Video Visit from 08/21/2023 in [facility]  C-SSRS RISK CATEGORY Moderate Risk Moderate Risk Moderate Risk     Assessment and Plan:   Krystal Glass is a 33 year old female with a past psychiatric history significant for sleep disturbances, major depressive disorder (with anxious distress) and attention/concentration deficit who presents to [facility] via virtual video visit for follow up and medication management.  Patient presents to the encounter stating that she has been taking her medications regularly.  She reports that her Strattera  has been helpful in managing her focus/concentration.  She reports that she has been experiencing anxiety attributed to irrational scenarios that she thinks up.  She reports that she has always had these thoughts but states that this is the first time addressing them.  She reports that her depression has been minimal stating that she has been caught up in the whirlwind of the holidays and does not feel as sad lately.  A PHQ-9 screen was performed with the patient scoring a 16.  A GAD-7 screen was also performed with the patient scoring a 17.  Patient reports that her use of Lexapro  does not appear to be as effective in managing her anxiety.  Provider recommended gabapentin  100 mg 3 times daily for the management of her anxiety.  Patient was agreeable to recommendation.  Patient reports that her sleep has been okay and does not need trazodone  anymore.  Patient to continue taking all  other medications as prescribed.  Patient's medications to be e-prescribed to pharmacy of choice.  A [place] Suicide Severity Rating Scale was performed with the patient being considered moderate risk.  Patient denies suicidal ideations and is able to contract for safety at this time.  Safety planning was discussed with the patient prior to the conclusion of the encounter.  - Patient was instructed to contact 911 in the event of a mental health crisis. - Patient was instructed to contact 988 Suicide and Crisis Lifeline in the event of a mental health crisis. - Patient was instructed to present to [facility] in the event of a mental health crisis.  Collaboration of Care: Collaboration of Care: Medication Management AEB Provider managing patient's psychiatric medications, Primary Care Provider AEB patient being seen by internal medicine, Psychiatrist AEB patient being followed by mental health provider at this facility, and Referral or follow-up with counselor/therapist AEB patient being seen by licensed clinical social worker at this facility  Patient/Guardian was advised Release of Information must be obtained prior to any record release in order to collaborate their care with an outside provider. Patient/Guardian was advised if they have not already done so to contact the registration department to sign all necessary forms in order for us  to release information regarding their care.   Consent: Patient/Guardian gives verbal consent for treatment and assignment of benefits for services provided during this visit. Patient/Guardian expressed understanding and agreed to proceed.   1. Major depressive disorder, recurrent episode, moderate with anxious distress (HCC)  - escitalopram  (LEXAPRO ) 20 MG tablet; Take 1 tablet (20 mg total) by mouth daily.  Dispense: 30 tablet; Refill: 1  2. Anxiety state  - escitalopram  (LEXAPRO ) 20 MG tablet; Take 1 tablet (20 mg total) by  mouth daily.  Dispense: 30 tablet; Refill: 1 - gabapentin  (NEURONTIN ) 100 MG capsule; Take 1 capsule (100 mg total) by mouth 3 (three) times daily.  Dispense: 90 capsule; Refill: 1  3. Attention and concentration deficit  - atomoxetine  (STRATTERA ) 40 MG capsule; Take 1 capsule (40 mg total) by mouth daily.  Dispense: 30 capsule; Refill: 1  Patient to follow up in 6 weeks Provider spent a total of 18 minutes with the patient/reviewing patient's chart  Reginia FORBES Bolster, PA 01/30/2024, 3:30 PM

## 2024-02-04 ENCOUNTER — Telehealth: Payer: Self-pay | Admitting: Nurse Practitioner

## 2024-02-04 NOTE — Telephone Encounter (Signed)
 Pt confirmed appt 12/24

## 2024-02-06 ENCOUNTER — Ambulatory Visit: Payer: Self-pay | Attending: Nurse Practitioner | Admitting: Nurse Practitioner

## 2024-02-06 ENCOUNTER — Encounter: Payer: Self-pay | Admitting: Nurse Practitioner

## 2024-02-06 VITALS — BP 109/72 | HR 71 | Temp 98.7°F | Ht 64.0 in | Wt 252.8 lb

## 2024-02-06 DIAGNOSIS — R7303 Prediabetes: Secondary | ICD-10-CM

## 2024-02-06 DIAGNOSIS — E559 Vitamin D deficiency, unspecified: Secondary | ICD-10-CM

## 2024-02-06 DIAGNOSIS — D171 Benign lipomatous neoplasm of skin and subcutaneous tissue of trunk: Secondary | ICD-10-CM

## 2024-02-06 LAB — POCT GLYCOSYLATED HEMOGLOBIN (HGB A1C): Hemoglobin A1C: 6.4 % — AB (ref 4.0–5.6)

## 2024-02-06 MED ORDER — METFORMIN HCL ER 500 MG PO TB24: 500.0000 mg | ORAL_TABLET | Freq: Two times a day (BID) | ORAL | 1 refills | Status: AC

## 2024-02-06 MED ORDER — VITAMIN D (ERGOCALCIFEROL) 1.25 MG (50000 UNIT) PO CAPS: 50000.0000 [IU] | ORAL_CAPSULE | ORAL | 0 refills | Status: AC

## 2024-02-06 MED ORDER — METFORMIN HCL ER 500 MG PO TB24: 500.0000 mg | ORAL_TABLET | Freq: Every day | ORAL | 1 refills | Status: DC

## 2024-02-06 NOTE — Progress Notes (Signed)
 "  Assessment & Plan:  Jaynee was seen today for medical management of chronic issues.  Diagnoses and all orders for this visit:  Prediabetes -     POCT glycosylated hemoglobin (Hb A1C) -     CMP14+EGFR -     metFORMIN  (GLUCOPHAGE -XR) 500 MG 24 hr tablet; Take 1 tablet (500 mg total) by mouth 2 (two) times daily with a meal.  Vitamin D  deficiency disease -     Vitamin D , Ergocalciferol , (DRISDOL ) 1.25 MG (50000 UNIT) CAPS capsule; Take 1 capsule (50,000 Units total) by mouth every 7 (seven) days. TAKE ONCE A WEEK (Patient not taking: Reported on 02/06/2024) -     VITAMIN D  25 Hydroxy (Vit-D Deficiency, Fractures)  Lipoma of torso -     Ambulatory referral to Dermatology    Patient has been counseled on age-appropriate routine health concerns for screening and prevention. These are reviewed and up-to-date. Referrals have been placed accordingly. Immunizations are up-to-date or declined.    Subjective:   Chief Complaint  Patient presents with   Medical Management of Chronic Issues    Discuss wight problem      Krystal Glass 33 y.o. female presents to office today for follow up to prediabetes.  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     She has a large lipoma of the left clavicle and a skin tag on the back of her neck that needs to be removed.    Prediabetes A1c increased to 6.4. Will start metformin  XR 500 mg BID. She is concerned about her inability to lose weight despite significant lifestyle changes.  Lab Results  Component Value Date   HGBA1C 6.4 (A) 02/06/2024    Lab Results  Component Value Date   HGBA1C 6.1 (H) 07/25/2023     Blood pressure is well controlled.  BP Readings from Last 3 Encounters:  02/06/24  109/72  07/25/23 136/81  12/27/22 115/78   She has gained almost 25 lbs since her last visit in November. Despite working out 6 days a week she has not been able to lose any weight and has actually gained. Activities include mexican folklore dancing, strength training and cardio. She started exercising in January. Follows a semi diabetic diet that she was introduced to when she was pregnany years ago with gestational diabetes.  She does not have insurance coverage or currently any diabetes diagnosis so we are not able to prescribe her any GLP-1's today.  She cannot afford the out-of-pocket cost for cash pay.  Would not advise phentermine as she is currently on Strattera .   Review of Systems  Constitutional:  Negative for fever, malaise/fatigue and weight loss.  HENT: Negative.  Negative for nosebleeds.   Eyes: Negative.  Negative for blurred vision, double vision and photophobia.  Respiratory: Negative.  Negative for cough and shortness of breath.   Cardiovascular: Negative.  Negative for chest pain, palpitations and leg swelling.  Gastrointestinal: Negative.  Negative for heartburn, nausea and vomiting.  Musculoskeletal: Negative.  Negative for myalgias.  Skin:        SEE HPI  Neurological: Negative.  Negative for dizziness, focal weakness, seizures and headaches.  Psychiatric/Behavioral: Negative.  Negative for suicidal ideas.     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  10/12/2015   Anxiety    Closed displaced fracture of left clavicle 10/12/2015   Closed displaced segmental fracture of shaft of left humerus 10/09/2015   Gestational diabetes    PCOS (polycystic ovarian syndrome)    PCOS (polycystic ovarian syndrome)    Postpartum depression     Past Surgical History:  Procedure Laterality Date   FRACTURE SURGERY     ORIF HUMERUS FRACTURE Left 10/10/2015   Procedure: OPEN REDUCTION INTERNAL FIXATION (ORIF) LEFT HUMERUS AND CLAVICLE  FRACTURE;  Surgeon: Ozell Bruch, MD;  Location: [facility] OR;  Service: Orthopedics;  Laterality: Left;    Family History  Problem Relation Age of Onset   Hypothyroidism Mother    Alcohol abuse Father    Anxiety disorder Father    Diabetes Father    Miscarriages / Stillbirths Maternal Aunt    Varicose Veins Maternal Aunt     Social History Reviewed with no changes to be made today.   Outpatient Medications Prior to Visit  Medication Sig Dispense Refill   atomoxetine  (STRATTERA ) 40 MG capsule Take 1 capsule (40 mg total) by mouth daily. 30 capsule 1   escitalopram  (LEXAPRO ) 20 MG tablet Take 1 tablet (20 mg total) by mouth daily. 30 tablet 1   gabapentin  (NEURONTIN ) 100 MG capsule Take 1 capsule (100 mg total) by mouth 3 (three) times daily. 90 capsule 1   traZODone  (DESYREL ) 50 MG tablet Take 1 tablet (50 mg total) by mouth at bedtime as needed for sleep. 30 tablet 2   hydrOXYzine  (ATARAX ) 10 MG tablet Take 1 tablet (10 mg total) by mouth 3 (three) times daily as needed. (Patient not taking: Reported on 02/06/2024) 75 tablet 1   metFORMIN  (GLUCOPHAGE -XR) 500 MG 24 hr tablet TAKE 1 TABLET BY MOUTH DAILY WITH BREAKFAST 30 tablet 0   Multiple Vitamin (MULTIVITAMIN WITH MINERALS) TABS tablet Take 1 tablet by mouth daily. (Patient not taking: Reported on 02/06/2024)     Vitamin D , Ergocalciferol , (DRISDOL ) 1.25 MG (50000 UNIT) CAPS capsule Take 1 capsule (50,000 Units total) by mouth every 7 (seven) days. TAKE ONCE A WEEK (Patient not taking: Reported on 07/25/2023) 12 capsule 0   No facility-administered medications prior to visit.    Allergies[1]     Objective:    BP 109/72   Pulse 71   Temp 98.7 F (37.1 C) (Oral)   Ht 5' 4 (1.626 m)   Wt 252 lb 12.8 oz (114.7 kg)   SpO2 98%   BMI 43.39 kg/m  Wt Readings from Last 3 Encounters:  02/06/24 252 lb 12.8 oz (114.7 kg)  07/25/23 242 lb (109.8 kg)  12/27/22 227 lb 6.4 oz (103.1 kg)    Physical Exam Vitals and nursing note  reviewed.  Constitutional:      Appearance: She is well-developed.  HENT:     Head: Normocephalic and atraumatic.  Cardiovascular:     Rate and Rhythm: Normal rate and regular rhythm.     Heart sounds: Normal heart sounds. No murmur heard.    No friction rub. No gallop.  Pulmonary:     Effort: Pulmonary effort is normal. No tachypnea or respiratory distress.     Breath sounds: Normal breath sounds. No decreased breath sounds, wheezing, rhonchi or rales.  Chest:     Chest wall: No tenderness.    Musculoskeletal:        General: Normal range of motion.     Cervical back: Normal range of motion.  Skin:    General: Skin is  warm and dry.  Neurological:     Mental Status: She is alert and oriented to person, place, and time.     Coordination: Coordination normal.  Psychiatric:        Behavior: Behavior normal. Behavior is cooperative.        Thought Content: Thought content normal.        Judgment: Judgment normal.          Patient has been counseled extensively about nutrition and exercise as well as the importance of adherence with medications and regular follow-up. The patient was given clear instructions to go to ER or return to medical center if symptoms don't improve, worsen or new problems develop. The patient verbalized understanding.   Follow-up: Return in about 6 months (around 08/06/2024).   Haze LELON Servant, FNP-BC [facility] [place], [place] [phone]   02/06/2024, 10:15 AM     [1] No Known Allergies  "

## 2024-02-07 ENCOUNTER — Ambulatory Visit: Payer: Self-pay | Admitting: Nurse Practitioner

## 2024-02-07 LAB — CMP14+EGFR
ALT: 15 IU/L (ref 0–32)
AST: 14 IU/L (ref 0–40)
Albumin: 4.1 g/dL (ref 3.9–4.9)
Alkaline Phosphatase: 77 IU/L (ref 41–116)
BUN/Creatinine Ratio: 17 (ref 9–23)
BUN: 11 mg/dL (ref 6–20)
Bilirubin Total: 0.2 mg/dL (ref 0.0–1.2)
CO2: 22 mmol/L (ref 20–29)
Calcium: 9.1 mg/dL (ref 8.7–10.2)
Chloride: 103 mmol/L (ref 96–106)
Creatinine, Ser: 0.63 mg/dL (ref 0.57–1.00)
Globulin, Total: 2.9 g/dL (ref 1.5–4.5)
Glucose: 136 mg/dL — ABNORMAL HIGH (ref 70–99)
Potassium: 4.5 mmol/L (ref 3.5–5.2)
Sodium: 137 mmol/L (ref 134–144)
Total Protein: 7 g/dL (ref 6.0–8.5)
eGFR: 122 mL/min/1.73

## 2024-02-07 LAB — VITAMIN D 25 HYDROXY (VIT D DEFICIENCY, FRACTURES): Vit D, 25-Hydroxy: 11 ng/mL — ABNORMAL LOW (ref 30.0–100.0)

## 2024-02-26 ENCOUNTER — Ambulatory Visit (HOSPITAL_COMMUNITY): Admitting: Clinical

## 2024-02-26 DIAGNOSIS — F331 Major depressive disorder, recurrent, moderate: Secondary | ICD-10-CM

## 2024-02-26 NOTE — Progress Notes (Signed)
" ° °  THERAPIST PROGRESS NOTE Virtual Visit via Video Note  I connected with Krystal Glass on 02/26/24 at 10:00 AM EST by a video enabled telemedicine application and verified that I am speaking with the correct person using two identifiers.  Location: Patient: home Provider: office   I discussed the limitations of evaluation and management by telemedicine and the availability of in person appointments. The patient expressed understanding and agreed to proceed.   Follow Up Instructions: I discussed the assessment and treatment plan with the patient. The patient was provided an opportunity to ask questions and all were answered. The patient agreed with the plan and demonstrated an understanding of the instructions.   The patient was advised to call back or seek an in-person evaluation if the symptoms worsen or if the condition fails to improve as anticipated.   Session Time: 50 min  Participation Level: Active  Behavioral Response: CasualAlertEuthymic  Type of Therapy: Individual Therapy  Treatment Goals addressed: client will engage in at least 80% of scheduled individual psychotherapy sessions  ProgressTowards Goals: Progressing  Interventions: CBT  Summary:  Krystal Glass is a 34 y.o. female who presents for the scheduled appointment oriented times five, appropriately dressed and friendly. Client denied hallucinations and delusions. Client reported she is doing well. Client reported she has been compliant with her medications. Client reported it is helpful. Client reported she has been thinking abut her goals for the year. Client reported she wants to be better at describing and saying her thoughts. Client reported when it comes to anxiety it comes from intrusive thoughts about what if's. Client reported she will be planning a small bachelorette party for her friend. Evidence of progress towards goal:  client reported medication compliance 7 days per week.  Suicidal/Homicidal:  Nowithout intent/plan  Therapist Response:  Therapist began the appointment asking how she has been doing. Therapist engaged using active listening and positive emotional support. Therapist used cbt to engage and give her time to discuss her thoughts and feelings. Therapist used CBT ask the client to identify her progress with frequency of use with coping skills with continued practice in her daily activity.    Therapist assigned the client homework to practice self care.    Plan: Return again in 3 weeks.  Diagnosis: mdd, recurrent episode, moderate with anxious distress  Collaboration of Care: Patient refused AEB none requested by the client.  Patient/Guardian was advised Release of Information must be obtained prior to any record release in order to collaborate their care with an outside provider. Patient/Guardian was advised if they have not already done so to contact the registration department to sign all necessary forms in order for us  to release information regarding their care.   Consent: Patient/Guardian gives verbal consent for treatment and assignment of benefits for services provided during this visit. Patient/Guardian expressed understanding and agreed to proceed.   Dene Nazir Y Lem Peary, LCSW 02/26/2024  "

## 2024-03-12 ENCOUNTER — Telehealth (HOSPITAL_COMMUNITY): Admitting: Physician Assistant

## 2024-03-18 ENCOUNTER — Ambulatory Visit (HOSPITAL_COMMUNITY): Admitting: Clinical

## 2024-03-18 NOTE — Progress Notes (Unsigned)
 "  THERAPIST PROGRESS NOTE Virtual Visit via Video Note  I connected with Krystal Glass on 03/18/24 at 11:00 AM EST by a video enabled telemedicine application and verified that I am speaking with the correct person using two identifiers.  Location: Patient: *** Provider: ***   I discussed the limitations of evaluation and management by telemedicine and the availability of in person appointments. The patient expressed understanding and agreed to proceed.   Follow Up Instructions: I discussed the assessment and treatment plan with the patient. The patient was provided an opportunity to ask questions and all were answered. The patient agreed with the plan and demonstrated an understanding of the instructions.   The patient was advised to call back or seek an in-person evaluation if the symptoms worsen or if the condition fails to improve as anticipated.    Session Time: ***  Participation Level: {BHH PARTICIPATION LEVEL:22264}  Behavioral Response: {Appearance:22683}{BHH LEVEL OF CONSCIOUSNESS:22305}{BHH MOOD:22306}  Type of Therapy: {CHL AMB BH Type of Therapy:21022741}  Treatment Goals addressed: ***  ProgressTowards Goals: {Progress Towards Goals:21014066}  Interventions: {CHL AMB BH Type of Intervention:21022753}  Summary:  Krystal Glass is a 34 y.o. female who presents with ***.    Client reported she is doing pretty good. Client reported the past 2 weeks the kids have been home from school because of the weather. Client reported her kids have been doing remote learning which is very hands on for the parents as well. Client reported she has been having difficulty with consistently falling asleep and napping during the day. Client reported she does not know if it is her medication regimen causing the extra tiredness. Client reported between her psych medication and health medication she has to hype herself up to take it all daily. Client reported she gets hard on herself for  being mad that her body can't do what it should so she wouldn't have to take medication. Client reported in 2024 she was doing pretty well and then in 2025 new stressors came about that put her off track somewhat. Client reported however gabapentin  has been helping with the irrational fears she has sometimes wondering about worse case scenarios. Client reported soon she is going back to her highschool for the homecoming basketball game because her husband wants to go. Evidence of progress towards goal:  client reported medication compliance 7 days per week.  Suicidal/Homicidal: Nowithout intent/plan  Therapist Response:  Therapist began the appointment asking the client how she has been. Therapist engaged with active listening and positive emotional support. Therapist used cbt to validate current discussed stressors and fatigue. Therapist used cbt to address negative self talk and maladaptive beliefs related to medication usage. Therapist used cbt to engage the client in cognitive restructuring to challenge negative thinking. Therapist used CBT ask the client to identify her progress with frequency of use with coping skills with continued practice in her daily activity.    Therapist assigned the client homework to to practice grounding and coping skills.   Plan: Return again in *** weeks.  Diagnosis: No diagnosis found.  Collaboration of Care: {BH OP Collaboration of Care:21014065}  Patient/Guardian was advised Release of Information must be obtained prior to any record release in order to collaborate their care with an outside provider. Patient/Guardian was advised if they have not already done so to contact the registration department to sign all necessary forms in order for us  to release information regarding their care.   Consent: Patient/Guardian gives verbal consent for treatment and assignment  of benefits for services provided during this visit. Patient/Guardian expressed understanding  and agreed to proceed.   Aneli Zara Y Kavaughn Faucett, LCSW 03/18/2024  "

## 2024-03-19 ENCOUNTER — Encounter (HOSPITAL_COMMUNITY): Payer: Self-pay | Admitting: Physician Assistant

## 2024-03-19 ENCOUNTER — Telehealth (HOSPITAL_COMMUNITY): Admitting: Physician Assistant

## 2024-03-19 DIAGNOSIS — R41840 Attention and concentration deficit: Secondary | ICD-10-CM

## 2024-03-19 DIAGNOSIS — F331 Major depressive disorder, recurrent, moderate: Secondary | ICD-10-CM

## 2024-03-19 DIAGNOSIS — F411 Generalized anxiety disorder: Secondary | ICD-10-CM

## 2024-03-19 MED ORDER — GABAPENTIN 100 MG PO CAPS: 100.0000 mg | ORAL_CAPSULE | Freq: Three times a day (TID) | ORAL | 1 refills | Status: AC

## 2024-03-19 MED ORDER — ESCITALOPRAM OXALATE 20 MG PO TABS: 20.0000 mg | ORAL_TABLET | Freq: Every day | ORAL | 1 refills | Status: AC

## 2024-03-19 MED ORDER — ATOMOXETINE HCL 40 MG PO CAPS: 40.0000 mg | ORAL_CAPSULE | Freq: Every day | ORAL | 1 refills | Status: AC

## 2024-03-19 NOTE — Progress Notes (Cosign Needed)
 [facility] MD/PA/NP OP Progress Note  Virtual Visit via Video Note  I connected with Krystal Glass on 03/19/24 at 11:30 AM EST by a video enabled telemedicine application and verified that I am speaking with the correct person using two identifiers.  Location: Patient: Home Provider: Clinic   I discussed the limitations of evaluation and management by telemedicine and the availability of in person appointments. The patient expressed understanding and agreed to proceed.  Follow Up Instructions:  I discussed the assessment and treatment plan with the patient. The patient was provided an opportunity to ask questions and all were answered. The patient agreed with the plan and demonstrated an understanding of the instructions.   The patient was advised to call back or seek an in-person evaluation if the symptoms worsen or if the condition fails to improve as anticipated.  I provided 13 minutes of non-face-to-face time during this encounter.  Reginia FORBES Bolster, PA    03/19/2024 11:30 AM Krystal Glass  MRN:  [id number]  Chief Complaint:  Chief Complaint  Patient presents with   Follow-up   Medication Refill   HPI:   Krystal Glass is a 34 year old female with a past psychiatric history significant for sleep disturbances, major depressive disorder (with anxious distress) and attention/concentration deficit who presents to [facility] via virtual video visit for follow up and medication management.  Patient is currently being managed on the following psychiatric medications:  Gabapentin  400 mg 3 times daily Atomoxetine  40 mg daily Lexapro  20 mg daily  Patient presents to the encounter stating that she has been good overall as of late.  She reports that she recently started therapy.  Since taking her gabapentin , patient reports that she feels more neutral.  She reports that this emotion is neither good or bad and states that whenever she has irrational  thoughts they are not as impactful as they were before.  She reports that she has been able to talk through these sensations that used to give her anxiety.  Patient reports that she has also been going to the gym which has been helpful in managing her mood.  Patient endorses depression and rates her depression a 3 out of 10 with 10 being most severe.  Patient endorses depressive episodes 1 to 2 days/week.  Patient endorses the following depressive symptoms: feelings of sadness, lack of motivation (improving), decreased energy, irritability (improving), feelings of guilt, and hopelessness.  Patient denies decreased concentration or feelings of worthlessness.  Patient also endorses some anxiety and rates her anxiety a 5 out of 10.  Contributing factors to her depression and anxiety include the current political landscape.  A PHQ-9 screen was performed with the patient scoring a 12. A GAD-7 screen was also performed with the patient scoring 12.  Patient is alert and oriented x 4, calm, cooperative, and fully engaged in conversation during the encounter.  Patient endorses normal mood.  Patient exhibits depressed mood with appropriate affect.  Patient denies suicidal or homicidal ideations.  She further denies auditory or visual hallucinations and does not appear to be responding to internal/external stimuli.  Patient endorses fair sleep and receives on average 6 hours of sleep per night.  Patient reports that she finds herself staying up late at night.  Patient endorses good appetite and eats on average 3 meals per day.  Patient denies alcohol consumption, tobacco use, or illicit drug use.  Visit Diagnosis:    ICD-10-CM   1. Anxiety state  F41.1 gabapentin  (NEURONTIN ) 100  MG capsule    escitalopram  (LEXAPRO ) 20 MG tablet    2. Major depressive disorder, recurrent episode, moderate with anxious distress (HCC)  F33.1 escitalopram  (LEXAPRO ) 20 MG tablet    3. Attention and concentration deficit  R41.840  atomoxetine  (STRATTERA ) 40 MG capsule      Past Psychiatric History:  Major depressive disorder with anxious distress Sleep disturbances Attention and concentration deficit  Past Medical History:  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 10/12/2015   Anxiety    Closed displaced fracture of left clavicle 10/12/2015   Closed displaced segmental fracture of shaft of left humerus 10/09/2015   Gestational diabetes    PCOS (polycystic ovarian syndrome)    PCOS (polycystic ovarian syndrome)    Postpartum depression     Past Surgical History:  Procedure Laterality Date   FRACTURE SURGERY     ORIF HUMERUS FRACTURE Left 10/10/2015   Procedure: OPEN REDUCTION INTERNAL FIXATION (ORIF) LEFT HUMERUS AND CLAVICLE FRACTURE;  Surgeon: Ozell Bruch, MD;  Location: [facility] OR;  Service: Orthopedics;  Laterality: Left;    Family Psychiatric History:  Patient denies a family history of psychiatric illness.  She reports there are no documents regarding family members having mental health due to parents disbelief around mental health.   Family History:  Family History  Problem Relation Age of Onset   Hypothyroidism Mother    Alcohol abuse Father    Anxiety disorder Father    Diabetes Father    Miscarriages / Stillbirths Maternal Aunt    Varicose Veins Maternal Aunt     Social History:  Social History   Socioeconomic History   Marital status: Married    Spouse name: Not on file   Number of children: Not on file   Years of education: Not on file   Highest education level: Not on file  Occupational History   Not on file  Tobacco Use   Smoking status: Never   Smokeless tobacco: Never  Vaping Use   Vaping status: Never Used  Substance and Sexual Activity   Alcohol use: Not Currently    Comment: OCC   Drug use: No   Sexual activity: Yes    Birth control/protection: None  Other Topics Concern   Not on file  Social History Narrative    Not on file   Social Drivers of Health   Tobacco Use: Low Risk (03/19/2024)   Patient History    Smoking Tobacco Use: Never    Smokeless Tobacco Use: Never    Passive Exposure: Not on file  Financial Resource Strain: Medium Risk (07/25/2023)   Overall Financial Resource Strain (CARDIA)    Difficulty of Paying Living Expenses: Somewhat hard  Food Insecurity: No Food Insecurity (07/25/2023)   Epic    Worried About Programme Researcher, Broadcasting/film/video in the Last Year: Never true    Ran Out of Food in the Last Year: Never true  Transportation Needs: No Transportation Needs (07/25/2023)   Epic    Lack of Transportation (Medical): No    Lack of Transportation (Non-Medical): No  Physical Activity: Sufficiently Active (07/25/2023)   Exercise Vital Sign    Days of Exercise per Week: 7 days    Minutes of Exercise per Session: 60 min  Stress: No Stress Concern Present (07/25/2023)   [employer] of Occupational Health - Occupational Stress Questionnaire    Feeling of Stress: Not at all  Social Connections: Moderately Isolated (07/25/2023)   Social Connection  and Isolation Panel    Frequency of Communication with Friends and Family: More than three times a week    Frequency of Social Gatherings with Friends and Family: More than three times a week    Attends Religious Services: Never    Database Administrator or Organizations: No    Attends Banker Meetings: Never    Marital Status: Married  Depression (PHQ2-9): High Risk (03/19/2024)   Depression (PHQ2-9)    PHQ-2 Score: 12  Alcohol Screen: Not on file  Housing: Low Risk (07/25/2023)   Epic    Unable to Pay for Housing in the Last Year: No    Number of Times Moved in the Last Year: 0    Homeless in the Last Year: No  Utilities: Not At Risk (07/25/2023)   Epic    Threatened with loss of utilities: No  Health Literacy: Adequate Health Literacy (07/25/2023)   B1300 Health Literacy    Frequency of need for help with medical instructions: Never     Allergies: No Known Allergies  Metabolic Disorder Labs: Lab Results  Component Value Date   HGBA1C 6.4 (A) 02/06/2024   MPG 137 11/02/2020   No results found for: PROLACTIN Lab Results  Component Value Date   CHOL 222 (H) 11/02/2020   TRIG 168 (H) 11/02/2020   HDL 47 11/02/2020   CHOLHDL 4.7 11/02/2020   VLDL 34 11/02/2020   LDLCALC 141 (H) 11/02/2020   Lab Results  Component Value Date   TSH 0.993 07/25/2023   TSH 0.650 02/20/2022    Therapeutic Level Labs: No results found for: LITHIUM No results found for: VALPROATE No results found for: CBMZ  Current Medications: Current Outpatient Medications  Medication Sig Dispense Refill   atomoxetine  (STRATTERA ) 40 MG capsule Take 1 capsule (40 mg total) by mouth daily. 30 capsule 1   escitalopram  (LEXAPRO ) 20 MG tablet Take 1 tablet (20 mg total) by mouth daily. 30 tablet 1   gabapentin  (NEURONTIN ) 100 MG capsule Take 1 capsule (100 mg total) by mouth 3 (three) times daily. 90 capsule 1   metFORMIN  (GLUCOPHAGE -XR) 500 MG 24 hr tablet Take 1 tablet (500 mg total) by mouth 2 (two) times daily with a meal. 180 tablet 1   traZODone  (DESYREL ) 50 MG tablet Take 1 tablet (50 mg total) by mouth at bedtime as needed for sleep. 30 tablet 2   Vitamin D , Ergocalciferol , (DRISDOL ) 1.25 MG (50000 UNIT) CAPS capsule Take 1 capsule (50,000 Units total) by mouth every 7 (seven) days. TAKE ONCE A WEEK (Patient not taking: Reported on 02/06/2024) 12 capsule 0   No current facility-administered medications for this visit.     Musculoskeletal: Strength & Muscle Tone: within normal limits Gait & Station: normal Patient leans: N/A  Psychiatric Specialty Exam: Review of Systems  Psychiatric/Behavioral:  Positive for dysphoric mood and sleep disturbance. Negative for decreased concentration, hallucinations, self-injury and suicidal ideas. The patient is nervous/anxious. The patient is not hyperactive.     There were no vitals taken  for this visit.There is no height or weight on file to calculate BMI.  General Appearance: Casual  Eye Contact:  Good  Speech:  Clear and Coherent and Normal Rate  Volume:  Normal  Mood:  Anxious and Depressed  Affect:  Congruent  Thought Process:  Coherent, Goal Directed, and Descriptions of Associations: Intact  Orientation:  Full (Time, Place, and Person)  Thought Content: WDL   Suicidal Thoughts:  No  Homicidal Thoughts:  No  Memory:  Immediate;   Good Recent;   Good Remote;   Good  Judgement:  Good  Insight:  Good  Psychomotor Activity:  Normal  Concentration:  Concentration: Good and Attention Span: Good  Recall:  Good  Fund of Knowledge: Good  Language: Good  Akathisia:  No  Handed:  Right  AIMS (if indicated): not done  Assets:  Communication Skills Desire for Improvement Financial Resources/Insurance Housing Social Support Transportation  ADL's:  Intact  Cognition: WNL  Sleep:  Fair   Screenings: GAD-7    Flowsheet Row Video Visit from 03/19/2024 in [facility] Office Visit from 02/06/2024 in [facility] Video Visit from 01/30/2024 in [facility] Video Visit from 10/01/2023 in [facility] Video Visit from 08/21/2023 in [facility]  Total GAD-7 Score 12 11 17 8 18    [phone]    Flowsheet Row Video Visit from 03/19/2024 in [facility] Office Visit from 02/06/2024 in [facility] [place] [facility] [place] Video Visit from 01/30/2024 in [facility] Video Visit from 10/01/2023 in [facility] Video Visit from 08/21/2023 in [facility]  PHQ-2 Total Score 2 2 2 1 5   PHQ-9 Total Score 12 12 16  -- 21   Flowsheet Row Video Visit from 03/19/2024 in [facility] Video Visit from 01/30/2024 in [facility] Video Visit from 10/01/2023 in [facility]  C-SSRS RISK CATEGORY Moderate Risk Moderate Risk Moderate Risk     Assessment and Plan:   Zahlia Deshazer is a 34 year old female with a past psychiatric history significant for sleep disturbances, major depressive disorder (with anxious distress) and attention/concentration deficit who presents to [facility] via virtual video visit for follow up and medication management.  Patient presents to the encounter stating that she has been taking her medications regularly and denies experiencing any adverse side effects.  Patient reports that since being placed on gabapentin , her irrational thoughts have not been as impactful.  She reports that when she has her irrational thoughts, she is able to talk through them.  Patient endorses some depression but states that her symptoms have been not as severe.  She endorses anxiety attributed to the current political landscape. A PHQ-9 screen was performed with the patient scoring a 12. A GAD-7 screen was also performed with the patient scoring 12.  Patient endorses stability on her current medication regimen and would like to continue taking her medications as prescribed.  Patient's medications to be e-prescribed to pharmacy of choice.  A [place] Suicide Severity Rating Scale was performed with the patient being considered moderate risk.  Patient denies suicidal ideations and is able to contract for safety at this time.  Safety planning was discussed with the patient prior to the conclusion of the encounter.  - Patient was instructed to contact 911 in the event of a mental health crisis. - Patient was instructed to contact [facility] Su[facility] Crisis [facility] in the event of a mental health crisis. - Patient was instructed to present to [facility] in the event of a mental health crisis.  Collaboration of Care: Collaboration of Care: Medication Management AEB Provider managing patient's psychiatric medications, Primary Care Provider AEB  patient being seen by internal medicine, Psychiatrist AEB patient being followed by mental health provider at this facility, and Referral or follow-up with counselor/therapist AEB patient being seen by licensed clinical social worker at this facility  Patient/Guardian was advised Release of Information must be obtained prior to any record release in order to collaborate their care with an outside provider. Patient/Guardian was advised if they have not already done so to contact the registration department to sign all necessary forms in order for us  to release information regarding their care.   Consent: Patient/Guardian gives verbal consent for treatment and assignment of benefits for services provided during this visit. Patient/Guardian expressed understanding and agreed to proceed.   1. Anxiety state  - gabapentin  (NEURONTIN ) 100 MG capsule; Take 1 capsule (100 mg total) by mouth 3 (three) times daily.  Dispense: 90 capsule; Refill: 1 - escitalopram  (LEXAPRO ) 20 MG tablet; Take 1 tablet (20 mg total) by mouth daily.  Dispense: 30 tablet; Refill: 1  2. Major depressive disorder, recurrent episode, moderate with anxious distress (HCC)  - escitalopram  (LEXAPRO ) 20 MG tablet; Take 1 tablet (20 mg total) by mouth daily.  Dispense: 30 tablet; Refill: 1  3. Attention and concentration deficit  - atomoxetine  (STRATTERA ) 40 MG capsule; Take 1 capsule (40 mg total) by mouth daily.  Dispense: 30 capsule; Refill: 1  Patient to follow up in 6 weeks Provider spent a total of 13 minutes with the patient/reviewing patient's chart  Reginia FORBES Bolster, PA 03/19/2024, 11:30 AM

## 2024-04-05 ENCOUNTER — Ambulatory Visit (HOSPITAL_COMMUNITY): Admitting: Clinical

## 2024-04-30 ENCOUNTER — Telehealth (HOSPITAL_COMMUNITY): Admitting: Physician Assistant

## 2024-08-06 ENCOUNTER — Ambulatory Visit: Payer: Self-pay | Admitting: Nurse Practitioner
